# Patient Record
Sex: Female | Born: 1955
Health system: Southern US, Community
[De-identification: ages and names within clinical notes are randomized; demographics above are authoritative.]

## PROBLEM LIST (undated history)

## (undated) DIAGNOSIS — F419 Anxiety disorder, unspecified: Secondary | ICD-10-CM

## (undated) DIAGNOSIS — N200 Calculus of kidney: Secondary | ICD-10-CM

## (undated) DIAGNOSIS — I10 Essential (primary) hypertension: Secondary | ICD-10-CM

## (undated) DIAGNOSIS — M545 Low back pain, unspecified: Secondary | ICD-10-CM

## (undated) DIAGNOSIS — E785 Hyperlipidemia, unspecified: Secondary | ICD-10-CM

## (undated) DIAGNOSIS — J329 Chronic sinusitis, unspecified: Secondary | ICD-10-CM

## (undated) DIAGNOSIS — K589 Irritable bowel syndrome without diarrhea: Secondary | ICD-10-CM

## (undated) DIAGNOSIS — T7840XA Allergy, unspecified, initial encounter: Secondary | ICD-10-CM

## (undated) HISTORY — PX: LASIK: SHX215

## (undated) HISTORY — PX: TUBAL LIGATION: SHX77

## (undated) HISTORY — DX: Essential (primary) hypertension: I10

## (undated) HISTORY — PX: COLONOSCOPY: SHX174

## (undated) HISTORY — DX: Hyperlipidemia, unspecified: E78.5

## (undated) HISTORY — DX: Low back pain: M54.5

## (undated) HISTORY — DX: Irritable bowel syndrome, unspecified: K58.9

## (undated) HISTORY — PX: EYE SURGERY: SHX253

## (undated) HISTORY — DX: Low back pain, unspecified: M54.50

## (undated) HISTORY — DX: Anxiety disorder, unspecified: F41.9

## (undated) HISTORY — PX: BREAST CYST ASPIRATION: SHX578

## (undated) HISTORY — PX: OTHER SURGICAL HISTORY: SHX169

## (undated) HISTORY — PX: ABDOMINAL HYSTERECTOMY: SHX81

## (undated) HISTORY — PX: DILATION AND CURETTAGE OF UTERUS: SHX78

## (undated) HISTORY — DX: Chronic sinusitis, unspecified: J32.9

## (undated) HISTORY — DX: Allergy, unspecified, initial encounter: T78.40XA

---

## 1998-02-18 ENCOUNTER — Ambulatory Visit (HOSPITAL_COMMUNITY): Admission: RE | Admit: 1998-02-18 | Discharge: 1998-02-18 | Payer: Self-pay | Admitting: Obstetrics & Gynecology

## 1998-02-25 ENCOUNTER — Ambulatory Visit (HOSPITAL_COMMUNITY): Admission: RE | Admit: 1998-02-25 | Discharge: 1998-02-25 | Payer: Self-pay | Admitting: Obstetrics & Gynecology

## 1998-09-16 ENCOUNTER — Encounter: Payer: Self-pay | Admitting: Obstetrics & Gynecology

## 1998-09-16 ENCOUNTER — Ambulatory Visit (HOSPITAL_COMMUNITY): Admission: RE | Admit: 1998-09-16 | Discharge: 1998-09-16 | Payer: Self-pay | Admitting: Obstetrics & Gynecology

## 1999-03-11 ENCOUNTER — Encounter: Payer: Self-pay | Admitting: Obstetrics & Gynecology

## 1999-03-11 ENCOUNTER — Ambulatory Visit (HOSPITAL_COMMUNITY): Admission: RE | Admit: 1999-03-11 | Discharge: 1999-03-11 | Payer: Self-pay | Admitting: Obstetrics & Gynecology

## 2000-03-21 ENCOUNTER — Encounter: Payer: Self-pay | Admitting: Obstetrics & Gynecology

## 2000-03-21 ENCOUNTER — Ambulatory Visit (HOSPITAL_COMMUNITY): Admission: RE | Admit: 2000-03-21 | Discharge: 2000-03-21 | Payer: Self-pay | Admitting: Obstetrics & Gynecology

## 2000-05-22 HISTORY — PX: TOTAL VAGINAL HYSTERECTOMY: SHX2548

## 2000-05-22 HISTORY — PX: COLONOSCOPY: SHX174

## 2001-03-28 ENCOUNTER — Encounter: Payer: Self-pay | Admitting: Obstetrics & Gynecology

## 2001-03-28 ENCOUNTER — Ambulatory Visit (HOSPITAL_COMMUNITY): Admission: RE | Admit: 2001-03-28 | Discharge: 2001-03-28 | Payer: Self-pay | Admitting: Obstetrics & Gynecology

## 2001-03-29 ENCOUNTER — Encounter: Admission: RE | Admit: 2001-03-29 | Discharge: 2001-03-29 | Payer: Self-pay | Admitting: Family Medicine

## 2001-03-29 ENCOUNTER — Encounter: Payer: Self-pay | Admitting: Obstetrics & Gynecology

## 2001-06-20 ENCOUNTER — Other Ambulatory Visit: Admission: RE | Admit: 2001-06-20 | Discharge: 2001-06-20 | Payer: Self-pay | Admitting: Obstetrics and Gynecology

## 2001-08-28 ENCOUNTER — Encounter: Admission: RE | Admit: 2001-08-28 | Discharge: 2001-08-28 | Payer: Self-pay | Admitting: Obstetrics & Gynecology

## 2001-08-28 ENCOUNTER — Encounter: Payer: Self-pay | Admitting: Obstetrics & Gynecology

## 2002-04-09 ENCOUNTER — Encounter: Payer: Self-pay | Admitting: Obstetrics & Gynecology

## 2002-04-09 ENCOUNTER — Encounter: Admission: RE | Admit: 2002-04-09 | Discharge: 2002-04-09 | Payer: Self-pay | Admitting: Obstetrics & Gynecology

## 2002-07-08 ENCOUNTER — Other Ambulatory Visit: Admission: RE | Admit: 2002-07-08 | Discharge: 2002-07-08 | Payer: Self-pay | Admitting: Obstetrics & Gynecology

## 2003-04-10 ENCOUNTER — Encounter: Admission: RE | Admit: 2003-04-10 | Discharge: 2003-04-10 | Payer: Self-pay | Admitting: Obstetrics & Gynecology

## 2003-06-10 ENCOUNTER — Ambulatory Visit (HOSPITAL_COMMUNITY): Admission: RE | Admit: 2003-06-10 | Discharge: 2003-06-10 | Payer: Self-pay | Admitting: Obstetrics & Gynecology

## 2003-06-10 ENCOUNTER — Encounter (INDEPENDENT_AMBULATORY_CARE_PROVIDER_SITE_OTHER): Payer: Self-pay | Admitting: Specialist

## 2003-07-23 ENCOUNTER — Other Ambulatory Visit: Admission: RE | Admit: 2003-07-23 | Discharge: 2003-07-23 | Payer: Self-pay | Admitting: Obstetrics & Gynecology

## 2004-06-06 ENCOUNTER — Encounter: Admission: RE | Admit: 2004-06-06 | Discharge: 2004-06-06 | Payer: Self-pay | Admitting: Obstetrics & Gynecology

## 2004-08-22 ENCOUNTER — Other Ambulatory Visit: Admission: RE | Admit: 2004-08-22 | Discharge: 2004-08-22 | Payer: Self-pay | Admitting: Obstetrics & Gynecology

## 2004-08-25 ENCOUNTER — Ambulatory Visit: Payer: Self-pay | Admitting: Internal Medicine

## 2004-08-29 ENCOUNTER — Ambulatory Visit: Payer: Self-pay | Admitting: Internal Medicine

## 2005-03-03 ENCOUNTER — Ambulatory Visit: Payer: Self-pay | Admitting: Internal Medicine

## 2005-03-09 ENCOUNTER — Ambulatory Visit: Payer: Self-pay | Admitting: Internal Medicine

## 2005-06-05 ENCOUNTER — Observation Stay (HOSPITAL_COMMUNITY): Admission: RE | Admit: 2005-06-05 | Discharge: 2005-06-06 | Payer: Self-pay | Admitting: Obstetrics & Gynecology

## 2005-06-05 ENCOUNTER — Encounter (INDEPENDENT_AMBULATORY_CARE_PROVIDER_SITE_OTHER): Payer: Self-pay | Admitting: *Deleted

## 2005-06-19 ENCOUNTER — Encounter: Admission: RE | Admit: 2005-06-19 | Discharge: 2005-06-19 | Payer: Self-pay | Admitting: Obstetrics & Gynecology

## 2005-06-20 ENCOUNTER — Encounter: Admission: RE | Admit: 2005-06-20 | Discharge: 2005-06-20 | Payer: Self-pay | Admitting: Obstetrics & Gynecology

## 2005-09-20 ENCOUNTER — Encounter: Payer: Self-pay | Admitting: Obstetrics & Gynecology

## 2005-10-23 ENCOUNTER — Encounter: Admission: RE | Admit: 2005-10-23 | Discharge: 2005-10-23 | Payer: Self-pay | Admitting: Obstetrics & Gynecology

## 2006-01-24 ENCOUNTER — Encounter: Admission: RE | Admit: 2006-01-24 | Discharge: 2006-01-24 | Payer: Self-pay | Admitting: Obstetrics & Gynecology

## 2006-02-06 ENCOUNTER — Ambulatory Visit: Payer: Self-pay | Admitting: Internal Medicine

## 2006-02-22 ENCOUNTER — Ambulatory Visit: Payer: Self-pay | Admitting: Internal Medicine

## 2006-06-26 ENCOUNTER — Encounter: Admission: RE | Admit: 2006-06-26 | Discharge: 2006-06-26 | Payer: Self-pay | Admitting: Obstetrics & Gynecology

## 2006-06-27 ENCOUNTER — Ambulatory Visit: Payer: Self-pay | Admitting: Internal Medicine

## 2006-06-27 LAB — CONVERTED CEMR LAB
AST: 29 units/L (ref 0–37)
Albumin: 3.7 g/dL (ref 3.5–5.2)
Alkaline Phosphatase: 130 units/L — ABNORMAL HIGH (ref 39–117)
Basophils Absolute: 0 10*3/uL (ref 0.0–0.1)
Bilirubin, Direct: 0.1 mg/dL (ref 0.0–0.3)
Calcium: 9.6 mg/dL (ref 8.4–10.5)
Eosinophils Relative: 1.6 % (ref 0.0–5.0)
HCT: 42.1 % (ref 36.0–46.0)
HDL: 37.8 mg/dL — ABNORMAL LOW (ref >39.0)
Lymphocytes Relative: 55.3 % — ABNORMAL HIGH (ref 12.0–46.0)
MCV: 95.5 fL (ref 78.0–100.0)
Monocytes Absolute: 0.3 10*3/uL (ref 0.2–0.7)
Neutrophils Relative %: 34.2 % — ABNORMAL LOW (ref 43.0–77.0)
Potassium: 4.2 meq/L (ref 3.5–5.1)
TSH: 4.13 microintl units/mL (ref 0.35–5.50)
Total Protein: 6.9 g/dL (ref 6.0–8.3)
WBC: 3.2 10*3/uL — ABNORMAL LOW (ref 4.5–10.5)

## 2006-07-04 ENCOUNTER — Ambulatory Visit: Payer: Self-pay | Admitting: Internal Medicine

## 2006-07-04 LAB — CONVERTED CEMR LAB
Basophils Absolute: 0 10*3/uL (ref 0.0–0.1)
Basophils Relative: 0.2 % (ref 0.0–1.0)
Bilirubin, Direct: 0.2 mg/dL (ref 0.0–0.3)
Eosinophils Absolute: 0 10*3/uL (ref 0.0–0.6)
Monocytes Absolute: 0.4 10*3/uL (ref 0.2–0.7)
Total Bilirubin: 1.2 mg/dL (ref 0.3–1.2)
WBC: 4.8 10*3/uL (ref 4.5–10.5)

## 2006-07-24 ENCOUNTER — Ambulatory Visit: Payer: Self-pay | Admitting: Internal Medicine

## 2006-12-11 ENCOUNTER — Encounter: Admission: RE | Admit: 2006-12-11 | Discharge: 2006-12-11 | Payer: Self-pay | Admitting: Obstetrics & Gynecology

## 2007-01-23 DIAGNOSIS — M545 Low back pain, unspecified: Secondary | ICD-10-CM | POA: Insufficient documentation

## 2007-06-03 ENCOUNTER — Telehealth: Payer: Self-pay | Admitting: Internal Medicine

## 2007-06-04 ENCOUNTER — Ambulatory Visit: Payer: Self-pay | Admitting: Internal Medicine

## 2007-06-04 DIAGNOSIS — E785 Hyperlipidemia, unspecified: Secondary | ICD-10-CM | POA: Insufficient documentation

## 2007-06-04 LAB — CONVERTED CEMR LAB
AST: 16 units/L (ref 0–37)
Albumin: 4.2 g/dL (ref 3.5–5.2)
Alkaline Phosphatase: 62 units/L (ref 39–117)
Bilirubin, Direct: 0.2 mg/dL (ref 0.0–0.3)
HDL: 39.4 mg/dL (ref >39.0)
LDL Cholesterol: 139.4 mg/dL
Total Bilirubin: 1.6 mg/dL — ABNORMAL HIGH (ref 0.3–1.2)
Triglycerides: 89 mg/dL (ref 0–149)
VLDL: 18 mg/dL (ref 0–40)

## 2007-06-13 ENCOUNTER — Ambulatory Visit: Payer: Self-pay | Admitting: Internal Medicine

## 2007-06-13 LAB — CONVERTED CEMR LAB
Cholesterol, target level: 200 mg/dL
HDL goal, serum: 40 mg/dL

## 2007-07-02 ENCOUNTER — Encounter: Admission: RE | Admit: 2007-07-02 | Discharge: 2007-07-02 | Payer: Self-pay | Admitting: Obstetrics & Gynecology

## 2007-09-02 ENCOUNTER — Ambulatory Visit: Payer: Self-pay | Admitting: Internal Medicine

## 2007-09-02 LAB — CONVERTED CEMR LAB
Albumin: 4.2 g/dL (ref 3.5–5.2)
Alkaline Phosphatase: 57 units/L (ref 39–117)
Cholesterol: 197 mg/dL (ref 0–200)

## 2007-09-12 ENCOUNTER — Ambulatory Visit: Payer: Self-pay | Admitting: Internal Medicine

## 2007-09-13 LAB — HM COLONOSCOPY

## 2007-12-16 ENCOUNTER — Ambulatory Visit: Payer: Self-pay | Admitting: Internal Medicine

## 2007-12-16 DIAGNOSIS — M25519 Pain in unspecified shoulder: Secondary | ICD-10-CM | POA: Insufficient documentation

## 2008-02-22 ENCOUNTER — Ambulatory Visit: Payer: Self-pay | Admitting: Internal Medicine

## 2008-03-10 ENCOUNTER — Ambulatory Visit: Payer: Self-pay | Admitting: Internal Medicine

## 2008-03-10 LAB — CONVERTED CEMR LAB
AST: 18 units/L (ref 0–37)
HDL: 42.7 mg/dL (ref >39.0)
LDL Cholesterol: 136 mg/dL — ABNORMAL HIGH (ref 0–99)
Total Bilirubin: 1.3 mg/dL — ABNORMAL HIGH (ref 0.3–1.2)
Total CHOL/HDL Ratio: 4.6
Triglycerides: 91 mg/dL (ref 0–149)
VLDL: 18 mg/dL (ref 0–40)

## 2008-03-18 ENCOUNTER — Ambulatory Visit: Payer: Self-pay | Admitting: Internal Medicine

## 2008-07-09 ENCOUNTER — Encounter: Admission: RE | Admit: 2008-07-09 | Discharge: 2008-07-09 | Payer: Self-pay | Admitting: Obstetrics & Gynecology

## 2008-09-08 ENCOUNTER — Ambulatory Visit: Payer: Self-pay | Admitting: Internal Medicine

## 2008-09-08 LAB — CONVERTED CEMR LAB
Basophils Absolute: 0 10*3/uL (ref 0.0–0.1)
Bilirubin Urine: NEGATIVE
Blood in Urine, dipstick: NEGATIVE
CO2: 31 meq/L (ref 19–32)
CRP, High Sensitivity: <1 (ref 0.00–5.00)
Chloride: 110 meq/L (ref 96–112)
Glucose, Bld: 92 mg/dL (ref 70–99)
HCT: 43.9 % (ref 36.0–46.0)
HDL: 47.5 mg/dL (ref >39.00)
Hemoglobin: 15.3 g/dL — ABNORMAL HIGH (ref 12.0–15.0)
Ketones, urine, test strip: NEGATIVE
Lymphs Abs: 1.6 10*3/uL (ref 0.7–4.0)
MCV: 96 fL (ref 78.0–100.0)
Monocytes Absolute: 0.3 10*3/uL (ref 0.1–1.0)
Neutro Abs: 2.3 10*3/uL (ref 1.4–7.7)
Platelets: 210 10*3/uL (ref 150.0–400.0)
RDW: 11.4 % — ABNORMAL LOW (ref 11.5–14.6)
TSH: 3.28 microintl units/mL (ref 0.35–5.50)
Total Bilirubin: 1.3 mg/dL — ABNORMAL HIGH (ref 0.3–1.2)
Total CHOL/HDL Ratio: 4
Total Protein: 6.7 g/dL (ref 6.0–8.3)
Urobilinogen, UA: 0.2
VLDL: 17.2 mg/dL (ref 0.0–40.0)
WBC Urine, dipstick: NEGATIVE

## 2008-09-16 ENCOUNTER — Ambulatory Visit: Payer: Self-pay | Admitting: Internal Medicine

## 2008-12-19 LAB — CONVERTED CEMR LAB: Pap Smear: NORMAL

## 2009-01-11 ENCOUNTER — Emergency Department (HOSPITAL_COMMUNITY): Admission: EM | Admit: 2009-01-11 | Discharge: 2009-01-11 | Payer: Self-pay | Admitting: Emergency Medicine

## 2009-01-28 ENCOUNTER — Encounter: Payer: Self-pay | Admitting: Internal Medicine

## 2009-06-22 ENCOUNTER — Ambulatory Visit: Payer: Self-pay | Admitting: Internal Medicine

## 2009-06-22 DIAGNOSIS — H3400 Transient retinal artery occlusion, unspecified eye: Secondary | ICD-10-CM | POA: Insufficient documentation

## 2009-06-29 ENCOUNTER — Encounter: Payer: Self-pay | Admitting: Internal Medicine

## 2009-06-30 ENCOUNTER — Encounter: Payer: Self-pay | Admitting: Internal Medicine

## 2009-06-30 ENCOUNTER — Ambulatory Visit: Payer: Self-pay

## 2009-07-20 ENCOUNTER — Encounter: Admission: RE | Admit: 2009-07-20 | Discharge: 2009-07-20 | Payer: Self-pay | Admitting: Obstetrics & Gynecology

## 2009-09-09 ENCOUNTER — Ambulatory Visit: Payer: Self-pay | Admitting: Internal Medicine

## 2009-09-09 LAB — CONVERTED CEMR LAB
AST: 18 units/L (ref 0–37)
Basophils Absolute: 0 10*3/uL (ref 0.0–0.1)
Bilirubin, Direct: 0.2 mg/dL (ref 0.0–0.3)
Calcium: 9.2 mg/dL (ref 8.4–10.5)
Chloride: 107 meq/L (ref 96–112)
Creatinine, Ser: 1 mg/dL (ref 0.4–1.2)
Eosinophils Relative: 0.8 % (ref 0.0–5.0)
GFR calc non Af Amer: 61.49 mL/min (ref >60)
Glucose, Urine, Semiquant: NEGATIVE
Hemoglobin: 15 g/dL (ref 12.0–15.0)
Ketones, urine, test strip: NEGATIVE
LDL Cholesterol: 132 mg/dL — ABNORMAL HIGH (ref 0–99)
Lymphocytes Relative: 40 % (ref 12.0–46.0)
Lymphs Abs: 1.7 10*3/uL (ref 0.7–4.0)
MCV: 95.9 fL (ref 78.0–100.0)
Monocytes Absolute: 0.3 10*3/uL (ref 0.1–1.0)
Neutro Abs: 2.2 10*3/uL (ref 1.4–7.7)
Neutrophils Relative %: 51.2 % (ref 43.0–77.0)
Nitrite: NEGATIVE
Platelets: 226 10*3/uL (ref 150.0–400.0)
RBC: 4.53 M/uL (ref 3.87–5.11)
Specific Gravity, Urine: 1.025
Total Bilirubin: 1.5 mg/dL — ABNORMAL HIGH (ref 0.3–1.2)
Total CHOL/HDL Ratio: 4
Total Protein: 7 g/dL (ref 6.0–8.3)
Triglycerides: 59 mg/dL (ref 0.0–149.0)
WBC: 4.3 10*3/uL — ABNORMAL LOW (ref 4.5–10.5)
pH: 5.5

## 2009-09-24 ENCOUNTER — Ambulatory Visit: Payer: Self-pay | Admitting: Internal Medicine

## 2009-09-27 LAB — HM MAMMOGRAPHY

## 2009-11-19 ENCOUNTER — Ambulatory Visit: Payer: Self-pay | Admitting: Internal Medicine

## 2009-11-19 DIAGNOSIS — R030 Elevated blood-pressure reading, without diagnosis of hypertension: Secondary | ICD-10-CM | POA: Insufficient documentation

## 2010-05-22 HISTORY — PX: COLONOSCOPY: SHX174

## 2010-06-11 ENCOUNTER — Encounter: Payer: Self-pay | Admitting: Obstetrics & Gynecology

## 2010-06-19 LAB — CONVERTED CEMR LAB: Pap Smear: NORMAL

## 2010-06-23 NOTE — Miscellaneous (Signed)
Summary: Orders Update  Clinical Lists Changes  Orders: Added new Test order of Carotid Duplex (Carotid Duplex) - Signed 

## 2010-06-23 NOTE — Assessment & Plan Note (Signed)
Summary: CPX/NO PAP/NJR   Vital Signs:  Patient profile:   55 year old female Height:      64 inches Weight:      125 pounds BMI:     21.53 Temp:     98.6 degrees F oral Pulse rate:   72 / minute Resp:     14 per minute BP sitting:   120 / 70  (left arm)  Vitals Entered By: Willy Eddy, LPN (Sep 25, 2438 3:15 PM) CC: cpx, Lipid Management   CC:  cpx and Lipid Management.  History of Present Illness: The pt was asked about all immunizations, health maint. services that are appropriate to their age and was given guidance on diet exercize  and weight management   Lipid Management History:      Negative NCEP/ATP III risk factors include female age less than 80 years old, no history of early menopause without estrogen hormone replacement, no family history for ischemic heart disease, non-tobacco-user status, non-hypertensive, no ASHD (atherosclerotic heart disease), no prior stroke/TIA, no peripheral vascular disease, and no history of aortic aneurysm.     Preventive Screening-Counseling & Management  Alcohol-Tobacco     Smoking Status: never  Problems Prior to Update: 1)  Amaurosis Fugax  (ICD-362.34) 2)  Physical Examination  (ICD-V70.0) 3)  Shoulder Pain, Left  (ICD-719.41) 4)  Hyperlipidemia  (ICD-272.4) 5)  Low Back Pain  (ICD-724.2)  Current Problems (verified): 1)  Amaurosis Fugax  (ICD-362.34) 2)  Physical Examination  (ICD-V70.0) 3)  Shoulder Pain, Left  (ICD-719.41) 4)  Hyperlipidemia  (ICD-272.4) 5)  Low Back Pain  (ICD-724.2)  Medications Prior to Update: 1)  Niacin 500 Mg  Tbcr (Niacin) .... Once Daily 2)  Estrogen Patch .05 .... Change 2 Times A Week 3)  Fish Oil Concentrate 1000 Mg  Caps (Omega-3 Fatty Acids) .... Two By Mouth Bid 4)  Calcium 600 1500 Mg Tabs (Calcium Carbonate) .... 2 Once Daily 5)  Vitamin D 1000 Unit Caps (Cholecalciferol) .Marland Kitchen.. 1 Once Daily  Current Medications (verified): 1)  Niacin 500 Mg  Tbcr (Niacin) .... Once Daily 2)   Estrogen Patch .05 .... Change 2 Times A Week 3)  Fish Oil Concentrate 1000 Mg  Caps (Omega-3 Fatty Acids) .... Two By Mouth Bid 4)  Calcium 600 1500 Mg Tabs (Calcium Carbonate) .... 2 Once Daily 5)  Vitamin D 1000 Unit Caps (Cholecalciferol) .Marland Kitchen.. 1 Once Daily  Allergies (verified): No Known Drug Allergies  Past History:  Family History: Last updated: 01/23/2007 Family History Other cancer Family History Hypertension Family History Ovarian cancer Family History of Stroke M 1st degree relative <50  Social History: Last updated: 01/23/2007 Married Never Smoked Alcohol use-yes Regular exercise-yes  Risk Factors: Exercise: yes (01/23/2007)  Risk Factors: Smoking Status: never (09/24/2009)  Past medical, surgical, family and social histories (including risk factors) reviewed, and no changes noted (except as noted below).  Past Medical History: Reviewed history from 01/23/2007 and no changes required. IBS Low back pain Sinusitis  Past Surgical History: Reviewed history from 09/16/2008 and no changes required. TAH '07 Colon '02 Tubal ligation eye surgery for vison  Family History: Reviewed history from 01/23/2007 and no changes required. Family History Other cancer Family History Hypertension Family History Ovarian cancer Family History of Stroke M 1st degree relative <50  Social History: Reviewed history from 01/23/2007 and no changes required. Married Never Smoked Alcohol use-yes Regular exercise-yes  Review of Systems  The patient denies anorexia, fever, weight loss, weight gain,  vision loss, decreased hearing, hoarseness, chest pain, syncope, dyspnea on exertion, peripheral edema, prolonged cough, headaches, hemoptysis, abdominal pain, melena, hematochezia, severe indigestion/heartburn, hematuria, incontinence, genital sores, muscle weakness, suspicious skin lesions, transient blindness, difficulty walking, depression, unusual weight change, abnormal  bleeding, enlarged lymph nodes, angioedema, breast masses, and testicular masses.    Physical Exam  General:  Well-developed,well-nourished,in no acute distress; alert,appropriate and cooperative throughout examination Head:  Normocephalic and atraumatic without obvious abnormalities. No apparent alopecia or balding. Eyes:  No corneal or conjunctival inflammation noted. EOMI. Perrla. Funduscopic exam benign, without hemorrhages, exudates or papilledema. Vision grossly normal. Ears:  R ear normal and L ear normal.   Nose:  no external deformity and no nasal discharge.   Mouth:  Oral mucosa and oropharynx without lesions or exudates.  Teeth in good repair. Neck:  No deformities, masses, or tenderness noted. Lungs:  Normal respiratory effort, chest expands symmetrically. Lungs are clear to auscultation, no crackles or wheezes. Heart:  Normal rate and regular rhythm. S1 and S2 normal without gallop, murmur, click, rub or other extra sounds. Abdomen:  Bowel sounds positive,abdomen soft and non-tender without masses, organomegaly or hernias noted. Rectal:  No external abnormalities noted. Normal sphincter tone. No rectal masses or tenderness.   Impression & Recommendations:  Problem # 1:  PHYSICAL EXAMINATION (ICD-V70.0)  Mammogram: normal (07/19/2009) Pap smear: normal (12/19/2008) Colonoscopy: normal (09/13/2007) Td Booster: Historical (05/22/2006)   Flu Vax: fluvirin (01/27/2009)   Pneumovax: Historical (03/09/2005) Chol: 195 (09/09/2009)   HDL: 51.30 (09/09/2009)   LDL: 132 (09/09/2009)   TG: 59.0 (09/09/2009) TSH: 3.88 (09/09/2009)   Next mammogram due:: 07/2010 (09/24/2009) Next Colonoscopy due:: 09/2012 (09/16/2008)  Discussed using sunscreen, use of alcohol, drug use, self breast exam, routine dental care, routine eye care, schedule for GYN exam, routine physical exam, seat belts, multiple vitamins, osteoporosis prevention, adequate calcium intake in diet, recommendations for  immunizations, mammograms and Pap smears.  Discussed exercise and checking cholesterol.  Discussed gun safety, safe sex, and contraception.  Complete Medication List: 1)  Niacin 500 Mg Tbcr (Niacin) .... Once daily 2)  Estrogen Patch .05  .... Change 2 times a week 3)  Fish Oil Concentrate 1000 Mg Caps (Omega-3 fatty acids) .... Two by mouth bid 4)  Calcium 600 1500 Mg Tabs (Calcium carbonate) .... 2 once daily 5)  Vitamin D 1000 Unit Caps (Cholecalciferol) .Marland Kitchen.. 1 once daily  Lipid Assessment/Plan:      Based on NCEP/ATP III, the patient's risk factor category is "0-1 risk factors".  The patient's lipid goals are as follows: Total cholesterol goal is 200; LDL cholesterol goal is 160; HDL cholesterol goal is 40; Triglyceride goal is 150.  Her LDL cholesterol goal has not been met.  Secondary causes for hyperlipidemia have been ruled out.  She has been counseled on adjunctive measures for lowering her cholesterol and has been provided with dietary instructions.    Patient Instructions: 1)  obtain blood pressure cuff  measure 4-5 times a week 2)  limit salt 3)  work of stress 4)  Please schedule a follow-up appointment in 2 months.   Preventive Care Screening  Mammogram:    Date:  07/19/2009    Next Due:  07/2010    Results:  normal   Pap Smear:    Date:  12/19/2008    Next Due:  12/2009    Results:  normal

## 2010-06-23 NOTE — Assessment & Plan Note (Signed)
Summary: CONSULT RE: EYE SPASMS/FLASHING OF LIGHT/CJR   Vital Signs:  Patient profile:   55 year old female Height:      64 inches Weight:      126 pounds BMI:     21.71 Temp:     98.2 degrees F oral Pulse rate:   72 / minute Resp:     14 per minute BP sitting:   140 / 80  (left arm)  Vitals Entered By: Willy Eddy, LPN (June 22, 2009 11:27 AM) CC: had 1 episode of eye flashing yesterday with slight nausea- to opthamolgist and every thing was ok- was told to go to pcp check for any vascular problems   CC:  had 1 episode of eye flashing yesterday with slight nausea- to opthamolgist and every thing was ok- was told to go to pcp check for any vascular problems.  History of Present Illness: The pt had a 10 min episode of flashes of light in the left greater that the right with some blurring of vision No head aches. Was moderately dizzy nad nauseated for about a minute Went to retinal specialist and there was not any intraocular pathology detected. Mild allergies with PND   Preventive Screening-Counseling & Management  Alcohol-Tobacco     Smoking Status: never  Problems Prior to Update: 1)  Physical Examination  (ICD-V70.0) 2)  Shoulder Pain, Left  (ICD-719.41) 3)  Hyperlipidemia  (ICD-272.4) 4)  Low Back Pain  (ICD-724.2)  Current Problems (verified): 1)  Physical Examination  (ICD-V70.0) 2)  Shoulder Pain, Left  (ICD-719.41) 3)  Hyperlipidemia  (ICD-272.4) 4)  Low Back Pain  (ICD-724.2)  Medications Prior to Update: 1)  Niacin 500 Mg  Tbcr (Niacin) .... Once Daily 2)  Estrogen Patch .05 .... Change 2 Times A Week 3)  Fish Oil Concentrate 1000 Mg  Caps (Omega-3 Fatty Acids) .... Two By Mouth Bid 4)  Calcium 600 1500 Mg Tabs (Calcium Carbonate) .... 2 Once Daily 5)  Vitamin D 1000 Unit Caps (Cholecalciferol) .Marland Kitchen.. 1 Once Daily  Current Medications (verified): 1)  Niacin 500 Mg  Tbcr (Niacin) .... Once Daily 2)  Estrogen Patch .05 .... Change 2 Times A Week 3)   Fish Oil Concentrate 1000 Mg  Caps (Omega-3 Fatty Acids) .... Two By Mouth Bid 4)  Calcium 600 1500 Mg Tabs (Calcium Carbonate) .... 2 Once Daily 5)  Vitamin D 1000 Unit Caps (Cholecalciferol) .Marland Kitchen.. 1 Once Daily  Allergies (verified): No Known Drug Allergies  Past History:  Family History: Last updated: 01/23/2007 Family History Other cancer Family History Hypertension Family History Ovarian cancer Family History of Stroke M 1st degree relative <50  Social History: Last updated: 01/23/2007 Married Never Smoked Alcohol use-yes Regular exercise-yes  Risk Factors: Exercise: yes (01/23/2007)  Risk Factors: Smoking Status: never (06/22/2009)  Past medical, surgical, family and social histories (including risk factors) reviewed, and no changes noted (except as noted below).  Past Medical History: Reviewed history from 01/23/2007 and no changes required. IBS Low back pain Sinusitis  Past Surgical History: Reviewed history from 09/16/2008 and no changes required. TAH '07 Colon '02 Tubal ligation eye surgery for vison  Family History: Reviewed history from 01/23/2007 and no changes required. Family History Other cancer Family History Hypertension Family History Ovarian cancer Family History of Stroke M 1st degree relative <50  Social History: Reviewed history from 01/23/2007 and no changes required. Married Never Smoked Alcohol use-yes Regular exercise-yes  Review of Systems  The patient denies anorexia, fever, weight  loss, weight gain, vision loss, decreased hearing, hoarseness, chest pain, syncope, dyspnea on exertion, peripheral edema, prolonged cough, headaches, hemoptysis, abdominal pain, melena, hematochezia, severe indigestion/heartburn, hematuria, incontinence, genital sores, muscle weakness, suspicious skin lesions, transient blindness, difficulty walking, depression, unusual weight change, abnormal bleeding, enlarged lymph nodes, angioedema, and breast  masses.    Physical Exam  General:  Well-developed,well-nourished,in no acute distress; alert,appropriate and cooperative throughout examination Head:  Normocephalic and atraumatic without obvious abnormalities. No apparent alopecia or balding. Ears:  R ear normal and L ear normal.   Neck:  No deformities, masses, or tenderness noted. Lungs:  Normal respiratory effort, chest expands symmetrically. Lungs are clear to auscultation, no crackles or wheezes. Heart:  Normal rate and regular rhythm. S1 and S2 normal without gallop, murmur, click, rub or other extra sounds. Pulses:  R and L carotid,radial,femoral,dorsalis pedis and posterior tibial pulses are full and equal bilaterally   Impression & Recommendations:  Problem # 1:  AMAUROSIS FUGAX (ICD-362.34) differential include ocular migraine single episode of flashing vision, eye exam by retinal specialist was norma; family hx of migraine with no personal  hx  Orders: Doppler Referral (Doppler)  Problem # 2:  HYPERLIPIDEMIA (ICD-272.4)  Her updated medication list for this problem includes:    Niacin 500 Mg Tbcr (Niacin) ..... Once daily  Orders: Doppler Referral (Doppler)  Labs Reviewed: SGOT: 17 (09/08/2008)   SGPT: 22 (09/08/2008)  Lipid Goals: Chol Goal: 200 (06/13/2007)   HDL Goal: 40 (06/13/2007)   LDL Goal: 160 (06/13/2007)   TG Goal: 150 (06/13/2007)  Prior 10 Yr Risk Heart Disease: 8 % (06/13/2007)   HDL:47.50 (09/08/2008), 42.7 (03/10/2008)  LDL:133 (09/08/2008), 136 (03/10/2008)  Chol:198 (09/08/2008), 197 (03/10/2008)  Trig:86.0 (09/08/2008), 91 (03/10/2008)  Complete Medication List: 1)  Niacin 500 Mg Tbcr (Niacin) .... Once daily 2)  Estrogen Patch .05  .... Change 2 times a week 3)  Fish Oil Concentrate 1000 Mg Caps (Omega-3 fatty acids) .... Two by mouth bid 4)  Calcium 600 1500 Mg Tabs (Calcium carbonate) .... 2 once daily 5)  Vitamin D 1000 Unit Caps (Cholecalciferol) .Marland Kitchen.. 1 once daily  Patient  Instructions: 1)  Please schedule a follow-up appointment as needed.

## 2010-06-23 NOTE — Miscellaneous (Signed)
Summary: Health Care Power of [facility] Power of Attorney   Imported By: Maryln Gottron 10/04/2009 13:17:48  _____________________________________________________________________  External Attachment:    Type:   Image     Comment:   External Document

## 2010-06-23 NOTE — Assessment & Plan Note (Signed)
Summary: 2 month rov/njr   Vital Signs:  Patient profile:   55 year old female Height:      64 inches Weight:      126 pounds BMI:     21.71 Temp:     98.2 degrees F oral Pulse rate:   72 / minute Resp:     14 per minute BP sitting:   130 / 78  (left arm)  Vitals Entered By: Willy Eddy, LPN (November 19, 1476 10:09 AM) CC: bp check, Lipid Management Is Patient Diabetic? No   CC:  bp check and Lipid Management.  History of Present Illness: there was some diferential in the bloood pressure at home and the office reading  whoich suggested white coat unstressed has been dealing with the issue of divorce    Lipid Management History:      Negative NCEP/ATP III risk factors include female age less than 42 years old, no history of early menopause without estrogen hormone replacement, no family history for ischemic heart disease, non-tobacco-user status, non-hypertensive, no ASHD (atherosclerotic heart disease), no prior stroke/TIA, no peripheral vascular disease, and no history of aortic aneurysm.     Preventive Screening-Counseling & Management  Alcohol-Tobacco     Smoking Status: never  Problems Prior to Update: 1)  Amaurosis Fugax  (ICD-362.34) 2)  Physical Examination  (ICD-V70.0) 3)  Shoulder Pain, Left  (ICD-719.41) 4)  Hyperlipidemia  (ICD-272.4) 5)  Low Back Pain  (ICD-724.2)  Current Problems (verified): 1)  Amaurosis Fugax  (ICD-362.34) 2)  Physical Examination  (ICD-V70.0) 3)  Shoulder Pain, Left  (ICD-719.41) 4)  Hyperlipidemia  (ICD-272.4) 5)  Low Back Pain  (ICD-724.2)  Medications Prior to Update: 1)  Niacin 500 Mg  Tbcr (Niacin) .... Once Daily 2)  Estrogen Patch .05 .... Change 2 Times A Week 3)  Fish Oil Concentrate 1000 Mg  Caps (Omega-3 Fatty Acids) .... Two By Mouth Bid 4)  Calcium 600 1500 Mg Tabs (Calcium Carbonate) .... 2 Once Daily 5)  Vitamin D 1000 Unit Caps (Cholecalciferol) .Marland Kitchen.. 1 Once Daily  Current Medications (verified): 1)  Niacin  500 Mg  Tbcr (Niacin) .... Once Daily 2)  Estrogen Patch .05 .... Change 2 Times A Week 3)  Fish Oil Concentrate 1000 Mg  Caps (Omega-3 Fatty Acids) .... Two By Mouth Bid 4)  Calcium 600 1500 Mg Tabs (Calcium Carbonate) .... 2 Once Daily 5)  Vitamin D 1000 Unit Caps (Cholecalciferol) .Marland Kitchen.. 1 Once Daily  Allergies (verified): No Known Drug Allergies  Past History:  Family History: Last updated: 01/23/2007 Family History Other cancer Family History Hypertension Family History Ovarian cancer Family History of Stroke M 1st degree relative <50  Social History: Last updated: 01/23/2007 Married Never Smoked Alcohol use-yes Regular exercise-yes  Risk Factors: Exercise: yes (01/23/2007)  Risk Factors: Smoking Status: never (11/19/2009)  Past medical, surgical, family and social histories (including risk factors) reviewed, and no changes noted (except as noted below).  Past Medical History: Reviewed history from 01/23/2007 and no changes required. IBS Low back pain Sinusitis  Past Surgical History: Reviewed history from 09/16/2008 and no changes required. TAH '07 Colon '02 Tubal ligation eye surgery for vison  Family History: Reviewed history from 01/23/2007 and no changes required. Family History Other cancer Family History Hypertension Family History Ovarian cancer Family History of Stroke M 1st degree relative <50  Social History: Reviewed history from 01/23/2007 and no changes required. Married Never Smoked Alcohol use-yes Regular exercise-yes  Review of Systems  The patient denies anorexia, fever, weight loss, weight gain, vision loss, decreased hearing, hoarseness, chest pain, syncope, dyspnea on exertion, peripheral edema, prolonged cough, headaches, hemoptysis, abdominal pain, melena, hematochezia, severe indigestion/heartburn, hematuria, incontinence, genital sores, muscle weakness, suspicious skin lesions, transient blindness, difficulty walking,  depression, unusual weight change, abnormal bleeding, enlarged lymph nodes, angioedema, and breast masses.    Physical Exam  General:  Well-developed,well-nourished,in no acute distress; alert,appropriate and cooperative throughout examination Head:  Normocephalic and atraumatic without obvious abnormalities. No apparent alopecia or balding. Eyes:  No corneal or conjunctival inflammation noted. EOMI. Perrla. Funduscopic exam benign, without hemorrhages, exudates or papilledema. Vision grossly normal. Ears:  R ear normal and L ear normal.   Nose:  no external deformity and no nasal discharge.   Mouth:  Oral mucosa and oropharynx without lesions or exudates.  Teeth in good repair. Neck:  No deformities, masses, or tenderness noted. Lungs:  Normal respiratory effort, chest expands symmetrically. Lungs are clear to auscultation, no crackles or wheezes. Heart:  Normal rate and regular rhythm. S1 and S2 normal without gallop, murmur, click, rub or other extra sounds.   Impression & Recommendations:  Problem # 1:  HYPERLIPIDEMIA (ICD-272.4)  Her updated medication list for this problem includes:    Niacin 500 Mg Tbcr (Niacin) ..... Once daily  Labs Reviewed: SGOT: 18 (09/09/2009)   SGPT: 18 (09/09/2009)  Lipid Goals: Chol Goal: 200 (06/13/2007)   HDL Goal: 40 (06/13/2007)   LDL Goal: 160 (06/13/2007)   TG Goal: 150 (06/13/2007)  Prior 10 Yr Risk Heart Disease: 6 % (09/24/2009)   HDL:51.30 (09/09/2009), 47.50 (09/08/2008)  LDL:132 (09/09/2009), 133 (09/08/2008)  Chol:195 (09/09/2009), 198 (09/08/2008)  Trig:59.0 (09/09/2009), 86.0 (09/08/2008)  Problem # 2:  ELEVATED BLOOD PRESSURE WITHOUT DIAGNOSIS OF HYPERTENSION (ICD-796.2)  BP today: 130/78 Prior BP: 120/70 (09/24/2009)  Prior 10 Yr Risk Heart Disease: 6 % (09/24/2009)  Labs Reviewed: Creat: 1.0 (09/09/2009) Chol: 195 (09/09/2009)   HDL: 51.30 (09/09/2009)   LDL: 132 (09/09/2009)   TG: 59.0 (09/09/2009)  Instructed in low sodium  diet (DASH Handout) and behavior modification.    Problem # 3:  LOW BACK PAIN (ICD-724.2)  Complete Medication List: 1)  Niacin 500 Mg Tbcr (Niacin) .... Once daily 2)  Estrogen Patch .05  .... Change 2 times a week 3)  Fish Oil Concentrate 1000 Mg Caps (Omega-3 fatty acids) .... Two by mouth bid 4)  Calcium 600 1500 Mg Tabs (Calcium carbonate) .... 2 once daily 5)  Vitamin D 1000 Unit Caps (Cholecalciferol) .Marland Kitchen.. 1 once daily  Lipid Assessment/Plan:      Based on NCEP/ATP III, the patient's risk factor category is "0-1 risk factors".  The patient's lipid goals are as follows: Total cholesterol goal is 200; LDL cholesterol goal is 160; HDL cholesterol goal is 40; Triglyceride goal is 150.  Her LDL cholesterol goal has not been met.  Secondary causes for hyperlipidemia have been ruled out.  She has been counseled on adjunctive measures for lowering her cholesterol and has been provided with dietary instructions.    Patient Instructions: 1)  march 08/2009  cpx  Appended Document: Orders Update    Clinical Lists Changes  Observations: Added new observation of FLU VAX: Historical (01/20/2010 15:36)       Immunization History:  Influenza Immunization History:    Influenza:  historical (01/20/2010)

## 2010-06-23 NOTE — Miscellaneous (Signed)
Summary: Living Will  Living Will   Imported By: Maryln Gottron 10/04/2009 13:15:45  _____________________________________________________________________  External Attachment:    Type:   Image     Comment:   External Document

## 2010-06-27 ENCOUNTER — Other Ambulatory Visit: Payer: Self-pay | Admitting: Obstetrics & Gynecology

## 2010-06-27 DIAGNOSIS — Z1231 Encounter for screening mammogram for malignant neoplasm of breast: Secondary | ICD-10-CM

## 2010-07-26 ENCOUNTER — Ambulatory Visit: Payer: Self-pay

## 2010-08-04 ENCOUNTER — Ambulatory Visit
Admission: RE | Admit: 2010-08-04 | Discharge: 2010-08-04 | Disposition: A | Discharge status: Home / Self Care | Payer: BC Managed Care – PPO | Source: Ambulatory Visit | Attending: Obstetrics & Gynecology | Admitting: Obstetrics & Gynecology

## 2010-08-04 DIAGNOSIS — Z1231 Encounter for screening mammogram for malignant neoplasm of breast: Secondary | ICD-10-CM

## 2010-09-19 ENCOUNTER — Other Ambulatory Visit (INDEPENDENT_AMBULATORY_CARE_PROVIDER_SITE_OTHER): Payer: BC Managed Care – PPO | Admitting: Internal Medicine

## 2010-09-19 DIAGNOSIS — Z0000 Encounter for general adult medical examination without abnormal findings: Secondary | ICD-10-CM

## 2010-09-19 LAB — CBC WITH DIFFERENTIAL/PLATELET
Basophils Relative: 0.6 % (ref 0.0–3.0)
Eosinophils Relative: 1.3 % (ref 0.0–5.0)
HCT: 42 % (ref 36.0–46.0)
Hemoglobin: 14.5 g/dL (ref 12.0–15.0)
Lymphs Abs: 2.2 10*3/uL (ref 0.7–4.0)
Monocytes Relative: 8.2 % (ref 3.0–12.0)
Neutro Abs: 1.7 10*3/uL (ref 1.4–7.7)
WBC: 4.3 10*3/uL — ABNORMAL LOW (ref 4.5–10.5)

## 2010-09-19 LAB — LIPID PANEL
Cholesterol: 191 mg/dL (ref 0–200)
LDL Cholesterol: 130 mg/dL — ABNORMAL HIGH (ref 0–99)

## 2010-09-19 LAB — BASIC METABOLIC PANEL
Chloride: 105 mEq/L (ref 96–112)
GFR: 56.65 mL/min — ABNORMAL LOW (ref >60.00)
Potassium: 5.2 mEq/L — ABNORMAL HIGH (ref 3.5–5.1)
Sodium: 140 mEq/L (ref 135–145)

## 2010-09-19 LAB — HEPATIC FUNCTION PANEL
ALT: 22 U/L (ref 0–35)
AST: 18 U/L (ref 0–37)
Albumin: 3.9 g/dL (ref 3.5–5.2)
Total Protein: 6.4 g/dL (ref 6.0–8.3)

## 2010-09-19 LAB — TSH: TSH: 4.1 u[IU]/mL (ref 0.35–5.50)

## 2010-09-19 LAB — POCT URINALYSIS DIPSTICK
Glucose, UA: NEGATIVE
Spec Grav, UA: 1.03
Urobilinogen, UA: 0.2

## 2010-09-20 ENCOUNTER — Encounter: Payer: Self-pay | Admitting: Internal Medicine

## 2010-09-26 ENCOUNTER — Ambulatory Visit (INDEPENDENT_AMBULATORY_CARE_PROVIDER_SITE_OTHER): Payer: BC Managed Care – PPO | Admitting: Internal Medicine

## 2010-09-26 ENCOUNTER — Encounter: Payer: Self-pay | Admitting: Internal Medicine

## 2010-09-26 DIAGNOSIS — E876 Hypokalemia: Secondary | ICD-10-CM

## 2010-09-26 DIAGNOSIS — E785 Hyperlipidemia, unspecified: Secondary | ICD-10-CM

## 2010-09-26 MED ORDER — AMBULATORY NON FORMULARY MEDICATION: Status: DC

## 2010-09-26 NOTE — Progress Notes (Signed)
  Subjective:    Patient ID: Karen Salinas, female    DOB: 22-Mar-1956, 55 y.o.   MRN: [id number]  HPI    Review of Systems  Constitutional: Negative for activity change, appetite change and fatigue.  HENT: Negative for ear pain, congestion, neck pain, postnasal drip and sinus pressure.   Eyes: Negative for redness and visual disturbance.  Respiratory: Negative for cough, shortness of breath and wheezing.   Gastrointestinal: Negative for abdominal pain and abdominal distention.  Genitourinary: Negative for dysuria, frequency and menstrual problem.  Musculoskeletal: Negative for myalgias, joint swelling and arthralgias.  Skin: Negative for rash and wound.  Neurological: Negative for dizziness, weakness and headaches.  Hematological: Negative for adenopathy. Does not bruise/bleed easily.  Psychiatric/Behavioral: Negative for sleep disturbance and decreased concentration.       Past Medical History  Diagnosis Date  . IBS (irritable bowel syndrome)   . Low back pain   . Sinusitis    Past Surgical History  Procedure Date  . Abdominal hysterectomy   . Colonoscopy 2002  . Tubal ligation   . Eye surgery for vision     reports that she has never smoked. She does not have any smokeless tobacco history on file. She reports that she drinks alcohol. She reports that she does not use illicit drugs. family history includes Hypertension in her father and mother and Stroke in her brother. No Known Allergies  Objective:   Physical Exam  Constitutional: She is oriented to person, place, and time. She appears well-developed and well-nourished. No distress.  HENT:  Head: Normocephalic and atraumatic.  Right Ear: External ear normal.  Left Ear: External ear normal.  Nose: Nose normal.  Mouth/Throat: Oropharynx is clear and moist.  Eyes: Conjunctivae and EOM are normal. Pupils are equal, round, and reactive to light.  Neck: Normal range of motion. Neck supple. No JVD present. No tracheal  deviation present. No thyromegaly present.  Cardiovascular: Normal rate, regular rhythm, normal heart sounds and intact distal pulses.   No murmur heard. Pulmonary/Chest: Effort normal and breath sounds normal. She has no wheezes. She exhibits no tenderness.  Abdominal: Soft. Bowel sounds are normal.  Musculoskeletal: Normal range of motion. She exhibits no edema and no tenderness.  Lymphadenopathy:    She has no cervical adenopathy.  Neurological: She is alert and oriented to person, place, and time. She has normal reflexes. No cranial nerve deficit.  Skin: Skin is warm and dry. She is not diaphoretic.  Psychiatric: She has a normal mood and affect. Her behavior is normal.          Assessment & Plan:   This is a routine physical examination for this healthy  Female. Reviewed all health maintenance protocols including mammography colonoscopy bone density and reviewed appropriate screening labs. Her immunization history was reviewed as well as her current medications and allergies refills of her chronic medications were given and the plan for yearly health maintenance was discussed all orders and referrals were made as appropriate.

## 2010-10-07 NOTE — Discharge Summary (Signed)
Karen Salinas, SCHWOERER                 ACCOUNT NO.:  [id number]   MEDICAL RECORD NO.:  [id number]          PATIENT TYPE:  OBV   LOCATION:  9320                          FACILITY:  [facility]   PHYSICIAN:  Freddy Finner, M.D.   DATE OF BIRTH:  04-Aug-1955   DATE OF ADMISSION:  06/05/2005  DATE OF DISCHARGE:  06/06/2005                                 DISCHARGE SUMMARY   DISCHARGE DIAGNOSES:  1.  Uterine leiomyomata.  2.  Persistent right ovarian cyst, resolved by time of surgery.  3.  Elevated CA125 with family history of ovarian cancer.  4.  Menorrhagia.   OPERATIVE PROCEDURE:  Laparoscopically-assisted vaginal hysterectomy,  bilateral salpingo-oophorectomy.   INTRAOPERATIVE AND POSTOPERATIVE COMPLICATIONS:  None.   DISPOSITION:  The patient was in satisfactory, improved condition on the  morning after surgery.  She was ambulating without difficulty, having  adequate bowel and bladder function.  She was tolerating a regular diet.  She was discharged home with Vivelle 0.15 mg for hormone replacement  therapy.  She was given Percocet 5/325 mg to be taken as needed for  postoperative pain with or in addition to ibuprofen 600 mg every six hours.  She is to follow up in the office in approximately two weeks.  She is to  call for fever or for heavy vaginal bleeding.  She is to take stool  softeners as needed.   The details of the present illness, past history, family history, review of  systems and physical exam are recorded in the admission note.  Essentially,  the postoperative diagnoses are the significant things in her history and  the physical findings on admission most remarkable for uterine fibroids and  for an ovarian cyst that persisted for at least three months' period of  time.   Laboratory data during this admission includes a normal urinalysis on  admission, a normal prothrombin time and PTT.  Hemoglobin of 15.1.  Postoperative hemoglobin was 13.5.  Histology on surgically removed  tissue  is pending at this time, although the pathologist did evaluate the tissue  immediately after surgery and found benign-appearing fibroids.   HOSPITAL COURSE:  The patient was admitted on the morning of surgery.  She  was given IV antibiotic and was placed in antiembolic compression hose and  taken to the operating room, where the above-described procedure was  accomplished without difficulty.  Blood loss was minimal.  On the morning  after surgery she was in good condition.  She was noted to be afebrile, her vital signs were stable, she was  ambulating without difficulty, having adequate bowel and bladder function,  and her condition was considered to be good.  She was discharged home with  disposition as noted above.      Freddy Finner, M.D.  Electronically Signed     WRN/MEDQ  D:  06/06/2005  T:  06/06/2005  Job:  [id number]

## 2010-10-07 NOTE — Op Note (Signed)
NAME:  Karen Salinas, Karen Salinas                           ACCOUNT NO.:  [id number]   MEDICAL RECORD NO.:  [id number]                   PATIENT TYPE:  AMB   LOCATION:  SDC                                  FACILITY:  [facility]   PHYSICIAN:  Freddy Finner, M.D.                DATE OF BIRTH:  1956-03-11   DATE OF PROCEDURE:  06/10/2003  DATE OF DISCHARGE:                                 OPERATIVE REPORT   PREOPERATIVE DIAGNOSIS:  Menorrhagia, midcycle bleeding, endometrial polyps,  uterine fibroids.   POSTOPERATIVE DIAGNOSIS:  Menorrhagia, midcycle bleeding, endometrial  polyps, uterine fibroids.   PROCEDURE:  Hysteroscopy, D&C, and resection of endometrial polyps.   SURGEON:  Freddy Finner, M.D.   ANESTHESIA:  Managed intravenous sedation and paracervical block.   ESTIMATED BLOOD LOSS:  10 mL.   SORBITOL DEFICIT:  50 mL.   COMPLICATIONS:  None.   INDICATIONS FOR PROCEDURE:  The patient is a 55 year old who has had  evaluation in the office for abnormal bleeding which showed at least three  polyps on sonohysterogram.  She also has an intramural leiomyomata, but none  in the cavity.  She was admitted today for hysteroscopy and D&C.  Operative  findings recorded in still photographs.   DESCRIPTION OF PROCEDURE:  She was admitted on the morning of surgery,  brought to the operating room, and placed under adequate intravenous  sedation, and placed in the dorsal lithotomy position using the Alan  stirrups system.  Betadine prep was carried out in the usual fashion.  Sterile drapes were applied.  Bivalve speculum was introduced.  Paracervical  block was placed using a total of 10 mL of 1% Xylocaine injected from 4 and  8 o'clock in the vaginal fornices.  The cervix was grasped on the anterior  lip with a single tooth tenaculum.  The uterus sounded to 10 cm.  The cervix  was progressively dilated to 23 with Pratts.  The [facility] 12.5 degree  hysteroscope was used with 3% Sorbitol as the distending  medium.  Inspection  revealed at least four polyps, maybe five.  Photographs were made of at  least some of these.  Gentle thorough curettage was carried out on three  different occasions with exploration with polyp forceps and hysteroscopic  confirmation of resection of all polyps.  The procedure at this point was  terminated.  All instruments were removed.  The patient was awakened and  taken to the recovery room in good condition.  She will be given routine  outpatient surgical instructions for follow up in the office in  approximately one week.                                               Freddy Finner, M.D.  WRN/MEDQ  D:  06/10/2003  T:  06/10/2003  Job:  [id number]

## 2010-10-07 NOTE — H&P (Signed)
Karen Salinas, Karen Salinas                 ACCOUNT NO.:  [id number]   MEDICAL RECORD NO.:  [id number]          PATIENT TYPE:  AMB   LOCATION:  SDC                           [facility]ACILITY:  [facility]   PHYSICIAN:  Freddy Finner, M.D.   DATE OF BIRTH:  May 17, 1956   DATE OF ADMISSION:  06/05/2005  DATE OF DISCHARGE:                                HISTORY & PHYSICAL   ADMITTING DIAGNOSES:  1.  Persistent right ovarian cysts.  2.  Fibroids.  3.  Menorrhagia.  4.  Elevated CA-125.  5.  Family history of ovarian cancer.   Patient is a 55 year old white married female gravida 3, para 2 whose  maternal grandmother had ovarian cancer.  Over the last couple of years she  has had increasing difficulty with menorrhagia and has been found to have  uterine fibroids.  She also has been found to have a simple benign-appearing  ovarian cyst but it does persist and will not resolve spontaneously.  A CA-  125 was obtained at the patient's request which was elevated to 58 with an  upper normal of 30.2.  The probability that this is not ovarian cancer has  been discussed with the patient since it is known that fibroids will also  elevate the CA-125 but based on the combination of findings and family  history with heavy menses, fibroids, and this persistent mass she has  requested definitive surgery.  She is admitted at this time for  laparoscopically assisted vaginal hysterectomy, bilateral salpingo-  oophorectomy.   Her current review of systems is otherwise negative.  There are no  cardiopulmonary, GI, or GU complaints.   PAST MEDICAL HISTORY:  Patient has intermittently had mild elevation of  blood pressure, but at the current time is on no medication.  There are no  other known significant medical illnesses in the patient.   PAST SURGICAL HISTORY:  Hysterectomy/D&C done in January of 2005 for  endometrial polyps and fibroids and menorrhagia.  She had a laparoscopic  tubal ligation in 1995 with no abnormal  findings noted at that procedure.   She has given birth to two children vaginally and had a second trimester  intrauterine fetal demise with her third pregnancy.  She has never had a  blood transfusion.  She does not have allergies to medications.  She is  currently on no medications on a regular basis.  She is not a cigarette  smoker.   PHYSICAL EXAMINATION:  HEENT:  Grossly within normal limits.  Thyroid gland  is not palpably enlarged.  VITAL SIGNS:  Blood pressure 130/80.  CHEST:  Clear to auscultation.  HEART:  Normal sinus rhythm without murmurs, rubs, or gallops.  BREASTS:  Normal.  No palpable masses.  No nipple discharge.  No skin  change.  ABDOMEN:  Soft and nontender without appreciable organomegaly or palpable  masses.  PELVIC:  External genitalia, vagina, and cervix are normal to inspection.  Bimanual reveals the uterus to be anterior in position, approximately [redacted]  weeks gestation of size, and irregularly nodular.  There are no palpable  adnexal masses,  although ultrasound does confirm the persistence of a 1.6 cm  simple right ovarian cyst.  The rectum is palpably normal and confirms the  above findings.  EXTREMITIES:  Without clubbing, cyanosis, edema.   ASSESSMENT:  Uterine fibroids, menorrhagia, persistent right ovarian cyst,  elevated CA-125, family history of ovarian cancer.   PLAN:  Laparoscopically assisted vaginal hysterectomy, bilateral salpingo-  oophorectomy.      Freddy Finner, M.D.  Electronically Signed     WRN/MEDQ  D:  06/02/2005  T:  06/02/2005  Job:  [id number]

## 2010-10-07 NOTE — Op Note (Signed)
Karen Salinas, Karen Salinas                 ACCOUNT NO.:  [id number]   MEDICAL RECORD NO.:  [id number]          PATIENT TYPE:  OBV   LOCATION:  9399                          FACILITY:  [facility]   PHYSICIAN:  Freddy Finner, M.D.   DATE OF BIRTH:  [birth year]   DATE OF PROCEDURE:  06/05/2005  DATE OF DISCHARGE:                                 OPERATIVE REPORT   PREOPERATIVE DIAGNOSES:  1.  Fibroids.  2.  Persistent simple right ovarian cyst.  3.  Family history of ovarian carcinoma.  4.  Slight elevation of CA-125.   POSTOPERATIVE DIAGNOSES:  1.  Fibroids.  2.  Persistent simple right ovarian cyst.  3.  Family history of ovarian carcinoma.  4.  Slight elevation of CA-125.  5.  No evidence of right ovarian cyst to gross inspection laparoscopically.   OPERATIVE PROCEDURE:  1.  Laparoscopic assisted vaginal hysterectomy.  2.  Bilateral salpingo-oophorectomy.   SURGEON:  Freddy Finner, M.D.   ASSISTANT:  Guy Sandifer. Henderson Cloud, M.D.   ESTIMATED BLOOD LOSS:  100 mL.   INTRAOPERATIVE COMPLICATIONS:  None.   Detail of the present illness are reported in the admission note.  The  patient was admitted on the morning of surgery.  She was brought to the  operating room and placed under adequate general anesthesia.  She received a  gram of Rocephin IV preoperatively and was in sequential compression hose  for deep vein thrombosis prophylaxis.  She was placed under general  anesthesia and placed in the dorsal  lithotomy position, using the [employer] system.  Betadine prep of the abdomen, perineum and vagina was  carried out in the usual fashion with Betadine scrub, followed by Betadine  solution.  The bladder was evacuated with a Robinson catheter.  Hulka  tenaculum was attached to the cervix without difficulty under direct  visualization.  Sterile drapes were applied.  Two small incisions were made,  one at the umbilicus and one just above the symphysis.  Through the upper  incision, an 11 mm non  lighted disposable trocar was introduced.  Direct  inspection with the laparoscope revealed adequate placement with no evidence  of intrauterine entry.  Pneumoperitoneum was allowed to accumulate with  carbon dioxide gas.  A 5 mm trocar was placed through the lower incision and  through it, the spring loaded grasping forceps was used.  Inspection of the  abdomen and pelvis was carried out.  The uterus was modularly enlarged.  There was no obvious cyst on either ovary at this time.  There was evidence  of a recent corpus luteum on the right ovary.  Photographs were made of all  of these findings, including the appendix and the upper abdomen, where no  apparent abnormalities were noted.  The appendix was visibly normal.  Using  the [facility] tripolar device, the dissection was begun.  The infundibular  pelvic round ligament, upper broad ligament on each side was progressively  sealed and divided with the dissection carried down to the level just about  the uterine arteries.  Anterior peritoneum was released  from the lower  segment, also using the tripolar device.  Attention was then turned  __________ retractors placed.  The Hulka tenaculum was replaced with a  Jacobs tenaculum.  A colpotomy incision was made while tenting the mucosa  posterior to the cervix and incision with Mayo scissors.  The peritoneum was  entered.  The cervix was circumscribed with the scalpel.  Using the [facility] device, the uterosacral pedicles were sealed and divided.  The  bladder pillows were sealed and divided.  The bladder was carefully advanced  off of the cervix.  The cardinal ligament pedicles were sealed and divided.  The vessel pedicles were sealed and divided.  The anterior peritoneum was  entered and was actually open based on previous dissection, except for a  small adhesion.  An additional pedicle was taken above the vessels on either  side.  The uterus was then delivered through the fascia  introitus.  The  remaining pedicles were then sealed and divided with a Jason Nest device.  These  essentially consisted of adhesions.  The angles of the __________ were  anchored to the uterosacral with mattress sutures of Monocryl.  The  uterosacral pocket and posterior peritoneum were closed with an interrupted  serial Monocryl.  The cuff was closed vertically figure-of-eight serial  Monocryl.  A Foley catheter was placed.  Reinspection laparoscopically was  carried out.  The Nezhat irrigation was system was used.  Careful  examination of all dissected surfaces was achieved.  A couple of small tiny  little bleeders were controlled with a Jason Nest device, without difficulty.  Hemostasis ws complete and also under reduced intraabdominal pressure.  Irrigating solution was aspirated from the abdomen.  All the instruments  were removed.  Gas was allowed to escape.  The incisions abdominally were  closed with interrupted subcuticular sutures of 3-0 Dexon and Steri-Strips  were applied to the lower incision.  0.5% Marcaine was injected into the  incision sites for postoperative analgesia.  The patient was awakened and  taken to recovery in good condition.      Freddy Finner, M.D.  Electronically Signed     WRN/MEDQ  D:  06/05/2005  T:  06/05/2005  Job:  [id number]

## 2011-03-22 ENCOUNTER — Other Ambulatory Visit: Payer: Self-pay | Admitting: Internal Medicine

## 2011-03-22 ENCOUNTER — Other Ambulatory Visit (INDEPENDENT_AMBULATORY_CARE_PROVIDER_SITE_OTHER): Payer: BC Managed Care – PPO

## 2011-03-22 DIAGNOSIS — E876 Hypokalemia: Secondary | ICD-10-CM

## 2011-03-22 DIAGNOSIS — E785 Hyperlipidemia, unspecified: Secondary | ICD-10-CM

## 2011-03-22 LAB — BASIC METABOLIC PANEL
CO2: 25 mEq/L (ref 19–32)
Calcium: 9.1 mg/dL (ref 8.4–10.5)
Creatinine, Ser: 1 mg/dL (ref 0.4–1.2)
GFR: 63.33 mL/min (ref >60.00)
Glucose, Bld: 105 mg/dL — ABNORMAL HIGH (ref 70–99)

## 2011-03-22 LAB — LIPID PANEL
Cholesterol: 203 mg/dL — ABNORMAL HIGH (ref 0–200)
HDL: 53.4 mg/dL (ref >39.00)
Triglycerides: 109 mg/dL (ref 0.0–149.0)

## 2011-03-29 ENCOUNTER — Encounter: Payer: Self-pay | Admitting: Internal Medicine

## 2011-03-29 ENCOUNTER — Ambulatory Visit (INDEPENDENT_AMBULATORY_CARE_PROVIDER_SITE_OTHER): Payer: BC Managed Care – PPO | Admitting: Internal Medicine

## 2011-03-29 VITALS — BP 124/80 | HR 72 | Temp 98.2°F | Resp 16 | Ht 62.0 in | Wt 124.0 lb

## 2011-03-29 DIAGNOSIS — T887XXA Unspecified adverse effect of drug or medicament, initial encounter: Secondary | ICD-10-CM

## 2011-03-29 DIAGNOSIS — Z0000 Encounter for general adult medical examination without abnormal findings: Secondary | ICD-10-CM

## 2011-03-29 DIAGNOSIS — E785 Hyperlipidemia, unspecified: Secondary | ICD-10-CM

## 2011-03-29 NOTE — Patient Instructions (Signed)
Patient was instructed to continue all medications as prescribed. To stop at the checkout desk and schedule a followup appointment  

## 2011-03-29 NOTE — Progress Notes (Signed)
  Subjective:    Patient ID: Karen Salinas, female    DOB: Jun 29, 1955, 55 y.o.   MRN: [id number]  HPI Followup of elevated lipid on niacin alone and weight loss and exercise intervention.  Follow up of elevated blood pressure without evidence of hypertension considered to be white coat syndrome.  Followup of need for colonoscopy for history of colon cancer in the family do colonoscopy this year   Review of Systems  Constitutional: Negative for activity change, appetite change and fatigue.  HENT: Negative for ear pain, congestion, neck pain, postnasal drip and sinus pressure.   Eyes: Negative for redness and visual disturbance.  Respiratory: Negative for cough, shortness of breath and wheezing.   Gastrointestinal: Negative for abdominal pain and abdominal distention.  Genitourinary: Negative for dysuria, frequency and menstrual problem.  Musculoskeletal: Negative for myalgias, joint swelling and arthralgias.  Skin: Negative for rash and wound.  Neurological: Negative for dizziness, weakness and headaches.  Hematological: Negative for adenopathy. Does not bruise/bleed easily.  Psychiatric/Behavioral: Negative for sleep disturbance and decreased concentration.   Past Medical History  Diagnosis Date  . IBS (irritable bowel syndrome)   . Low back pain   . Sinusitis    Past Surgical History  Procedure Date  . Abdominal hysterectomy   . Colonoscopy 2002  . Tubal ligation   . Eye surgery for vision     reports that she has never smoked. She does not have any smokeless tobacco history on file. She reports that she drinks alcohol. She reports that she does not use illicit drugs. family history includes Hypertension in her father and mother and Stroke in her brother. No Known Allergies      Objective:   Physical Exam  Nursing note and vitals reviewed. Constitutional: She is oriented to person, place, and time. She appears well-developed and well-nourished. No distress.  HENT:    Head: Normocephalic and atraumatic.  Right Ear: External ear normal.  Left Ear: External ear normal.  Nose: Nose normal.  Mouth/Throat: Oropharynx is clear and moist.  Eyes: Conjunctivae and EOM are normal. Pupils are equal, round, and reactive to light.  Neck: Normal range of motion. Neck supple. No JVD present. No tracheal deviation present. No thyromegaly present.  Cardiovascular: Normal rate, regular rhythm, normal heart sounds and intact distal pulses.   No murmur heard. Pulmonary/Chest: Effort normal and breath sounds normal. She has no wheezes. She exhibits no tenderness.  Abdominal: Soft. Bowel sounds are normal.  Musculoskeletal: Normal range of motion. She exhibits no edema and no tenderness.  Lymphadenopathy:    She has no cervical adenopathy.  Neurological: She is alert and oriented to person, place, and time. She has normal reflexes. No cranial nerve deficit.  Skin: Skin is warm and dry. She is not diaphoretic.  Psychiatric: She has a normal mood and affect. Her behavior is normal.          Assessment & Plan:  Discussed the effect of exercise on her cholesterol and increased HDL the need to continue the niacin on a scheduled basis her weight loss has been applied and an extra presents by lower blood pressure she is doing well with an exercise program she states that she has had no recurrent shoulder or back pain and that her blood pressure has been in good control

## 2011-04-27 ENCOUNTER — Encounter: Payer: Self-pay | Admitting: Internal Medicine

## 2011-06-21 ENCOUNTER — Encounter: Payer: Self-pay | Admitting: Family

## 2011-06-21 ENCOUNTER — Ambulatory Visit (INDEPENDENT_AMBULATORY_CARE_PROVIDER_SITE_OTHER): Payer: BC Managed Care – PPO | Admitting: Family

## 2011-06-21 VITALS — BP 120/80 | Temp 99.9°F | Wt 127.0 lb

## 2011-06-21 DIAGNOSIS — E782 Mixed hyperlipidemia: Secondary | ICD-10-CM

## 2011-06-21 DIAGNOSIS — J0100 Acute maxillary sinusitis, unspecified: Secondary | ICD-10-CM

## 2011-06-21 MED ORDER — AMOXICILLIN-POT CLAVULANATE 875-125 MG PO TABS: 1.0000 | ORAL_TABLET | Freq: Two times a day (BID) | ORAL | Status: AC

## 2011-06-21 NOTE — Patient Instructions (Signed)

## 2011-06-21 NOTE — Progress Notes (Signed)
  Subjective:    Patient ID: Karen Salinas, female    DOB: 17-Nov-1955, 56 y.o.   MRN: [id number]  HPI Comments: C/o sinus drainage, nonproductive cough, sorethroat, and pressure headache since last Thurs getting progressively worse with low grade fever 99.9. Took otc dayquil, nyquil, and alleve.   Sinusitis Associated symptoms include congestion, sinus pressure and a sore throat. Pertinent negatives include no ear pain or sneezing.    She has a history of hyperlipidemia that is stable.   Review of Systems  HENT: Positive for congestion, sore throat, postnasal drip and sinus pressure. Negative for ear pain, facial swelling, rhinorrhea, sneezing, tinnitus and ear discharge.   Eyes: Negative.   Respiratory: Negative.   Cardiovascular: Negative.    Past Medical History  Diagnosis Date  . IBS (irritable bowel syndrome)   . Low back pain   . Sinusitis     History   Social History  . Marital Status: Married    Spouse Name: N/A    Number of Children: N/A  . Years of Education: N/A   Occupational History  . Not on file.   Social History Main Topics  . Smoking status: Never Smoker   . Smokeless tobacco: Not on file  . Alcohol Use: Yes  . Drug Use: No  . Sexually Active: Yes   Other Topics Concern  . Not on file   Social History Narrative   Regular exercise    Past Surgical History  Procedure Date  . Abdominal hysterectomy   . Colonoscopy 2002  . Tubal ligation   . Eye surgery for vision     Family History  Problem Relation Age of Onset  . Hypertension Mother   . Hypertension Father   . Stroke Brother     No Known Allergies  Current Outpatient Prescriptions on File Prior to Visit  Medication Sig Dispense Refill  . AMBULATORY NON FORMULARY MEDICATION Medication Name:  omega-3 liquid G. N C  brand.  5 mL    . calcium carbonate (OS-CAL) 600 MG TABS Take 600 mg by mouth 2 (two) times daily with a meal.        . Cholecalciferol (VITAMIN D) 1000 UNITS capsule Take  1,000 Units by mouth daily.        Marland Kitchen estradiol (VIVELLE-DOT) 0.05 MG/24HR Place 1 patch onto the skin once a week.        . niacin (NIASPAN) 500 MG CR tablet Take 500 mg by mouth at bedtime.          BP 120/80  Temp(Src) 99.9 F (37.7 C) (Oral)  Wt 127 lb (57.607 kg)chart     Objective:   Physical Exam  Constitutional: She is oriented to person, place, and time. She appears well-developed and well-nourished.  Cardiovascular: Normal rate, regular rhythm, normal heart sounds and intact distal pulses.  Exam reveals no gallop and no friction rub.   No murmur heard. Pulmonary/Chest: Effort normal and breath sounds normal. No respiratory distress. She has no wheezes. She has no rales. She exhibits no tenderness.  Neurological: She is alert and oriented to person, place, and time.  Skin: Skin is warm and dry.          Assessment & Plan:  Assessment Acute sinusitis, Hyperlipidemia  Plan: Augmentin, increase po fluid intake, take otc claritin-d or zyrtec-d prn, take antibiotics with food. And call the office if symptoms worsen or persist, recheck as scheduled and when necessary.

## 2011-07-04 ENCOUNTER — Other Ambulatory Visit: Payer: Self-pay | Admitting: Obstetrics & Gynecology

## 2011-07-04 DIAGNOSIS — Z1231 Encounter for screening mammogram for malignant neoplasm of breast: Secondary | ICD-10-CM

## 2011-08-08 ENCOUNTER — Ambulatory Visit
Admission: RE | Admit: 2011-08-08 | Discharge: 2011-08-08 | Disposition: A | Discharge status: Home / Self Care | Payer: BC Managed Care – PPO | Source: Ambulatory Visit | Attending: Obstetrics & Gynecology | Admitting: Obstetrics & Gynecology

## 2011-08-08 DIAGNOSIS — Z1231 Encounter for screening mammogram for malignant neoplasm of breast: Secondary | ICD-10-CM

## 2011-09-11 ENCOUNTER — Other Ambulatory Visit: Payer: Self-pay | Admitting: Dermatology

## 2011-09-26 ENCOUNTER — Other Ambulatory Visit (INDEPENDENT_AMBULATORY_CARE_PROVIDER_SITE_OTHER): Payer: BC Managed Care – PPO

## 2011-09-26 DIAGNOSIS — Z0000 Encounter for general adult medical examination without abnormal findings: Secondary | ICD-10-CM

## 2011-09-26 LAB — CBC WITH DIFFERENTIAL/PLATELET
Eosinophils Relative: 1.7 % (ref 0.0–5.0)
HCT: 43.9 % (ref 36.0–46.0)
Lymphs Abs: 2.1 10*3/uL (ref 0.7–4.0)
Monocytes Relative: 8.4 % (ref 3.0–12.0)
Neutrophils Relative %: 47.3 % (ref 43.0–77.0)
Platelets: 231 10*3/uL (ref 150.0–400.0)
RBC: 4.61 Mil/uL (ref 3.87–5.11)
WBC: 5 10*3/uL (ref 4.5–10.5)

## 2011-09-26 LAB — BASIC METABOLIC PANEL
Chloride: 108 mEq/L (ref 96–112)
Creatinine, Ser: 0.9 mg/dL (ref 0.4–1.2)
Potassium: 4.7 mEq/L (ref 3.5–5.1)
Sodium: 143 mEq/L (ref 135–145)

## 2011-09-26 LAB — HEPATIC FUNCTION PANEL
ALT: 28 U/L (ref 0–35)
Alkaline Phosphatase: 60 U/L (ref 39–117)
Bilirubin, Direct: 0 mg/dL (ref 0.0–0.3)
Total Bilirubin: 0.7 mg/dL (ref 0.3–1.2)

## 2011-09-26 LAB — LIPID PANEL
Total CHOL/HDL Ratio: 4
Triglycerides: 89 mg/dL (ref 0.0–149.0)

## 2011-09-26 LAB — TSH: TSH: 4.51 u[IU]/mL (ref 0.35–5.50)

## 2011-09-26 LAB — POCT URINALYSIS DIPSTICK
Glucose, UA: NEGATIVE
Nitrite, UA: NEGATIVE
Urobilinogen, UA: 0.2

## 2011-09-26 LAB — LDL CHOLESTEROL, DIRECT: Direct LDL: 150.4 mg/dL

## 2011-09-27 LAB — VITAMIN D 25 HYDROXY (VIT D DEFICIENCY, FRACTURES): Vit D, 25-Hydroxy: 47 ng/mL (ref 30–89)

## 2011-09-29 ENCOUNTER — Other Ambulatory Visit: Payer: BC Managed Care – PPO

## 2011-10-05 ENCOUNTER — Ambulatory Visit (INDEPENDENT_AMBULATORY_CARE_PROVIDER_SITE_OTHER): Payer: BC Managed Care – PPO | Admitting: Internal Medicine

## 2011-10-05 ENCOUNTER — Encounter: Payer: Self-pay | Admitting: Internal Medicine

## 2011-10-05 VITALS — BP 144/88 | HR 68 | Temp 98.2°F | Resp 14 | Ht 62.0 in | Wt 130.0 lb

## 2011-10-05 DIAGNOSIS — E785 Hyperlipidemia, unspecified: Secondary | ICD-10-CM

## 2011-10-05 DIAGNOSIS — Z0000 Encounter for general adult medical examination without abnormal findings: Secondary | ICD-10-CM

## 2011-10-05 MED ORDER — ROSUVASTATIN CALCIUM 20 MG PO TABS: 20.0000 mg | ORAL_TABLET | ORAL | Status: DC

## 2011-10-05 NOTE — Patient Instructions (Signed)
The patient is instructed to continue all medications as prescribed. Schedule followup with check out clerk upon leaving the clinic  

## 2011-10-05 NOTE — Progress Notes (Signed)
Subjective:    Patient ID: Karen Salinas, female    DOB: 05/01/1956, 56 y.o.   MRN: [id number]  HPI Patient is a 57 year old female who presents for complete physical examination.  She has a history of borderline hypertension in the today her blood pressure was 144/88 she is going to obtain a blood pressure cuff and monitor her blood pressure a regular basis.  She also is followed for borderline hyperlipidemia with elevated LDL and a borderline total cholesterol.  Her triglycerides are normal she has been on a combination of omega-3 supplements and niacin and has not achieved goal   Review of Systems  Constitutional: Negative for activity change, appetite change and fatigue.  HENT: Negative for ear pain, congestion, neck pain, postnasal drip and sinus pressure.   Eyes: Negative for redness and visual disturbance.  Respiratory: Negative for cough, shortness of breath and wheezing.   Gastrointestinal: Negative for abdominal pain and abdominal distention.  Genitourinary: Negative for dysuria, frequency and menstrual problem.  Musculoskeletal: Negative for myalgias, joint swelling and arthralgias.  Skin: Negative for rash and wound.  Neurological: Negative for dizziness, weakness and headaches.  Hematological: Negative for adenopathy. Does not bruise/bleed easily.  Psychiatric/Behavioral: Negative for sleep disturbance and decreased concentration.   Past Medical History  Diagnosis Date  . IBS (irritable bowel syndrome)   . Low back pain   . Sinusitis     History   Social History  . Marital Status: Married    Spouse Name: N/A    Number of Children: N/A  . Years of Education: N/A   Occupational History  . Not on file.   Social History Main Topics  . Smoking status: Never Smoker   . Smokeless tobacco: Not on file  . Alcohol Use: Yes  . Drug Use: No  . Sexually Active: Yes   Other Topics Concern  . Not on file   Social History Narrative   Regular exercise    Past  Surgical History  Procedure Date  . Abdominal hysterectomy   . Colonoscopy 2002  . Tubal ligation   . Eye surgery for vision     Family History  Problem Relation Age of Onset  . Hypertension Mother   . Hypertension Father   . Stroke Brother     No Known Allergies  Current Outpatient Prescriptions on File Prior to Visit  Medication Sig Dispense Refill  . AMBULATORY NON FORMULARY MEDICATION Medication Name:  omega-3 liquid G. N C  brand.  5 mL    . calcium carbonate (OS-CAL) 600 MG TABS Take 600 mg by mouth 2 (two) times daily with a meal.        . Cholecalciferol (VITAMIN D) 1000 UNITS capsule Take 1,000 Units by mouth daily.        Marland Kitchen estradiol (VIVELLE-DOT) 0.05 MG/24HR Place 1 patch onto the skin once a week.        . rosuvastatin (CRESTOR) 20 MG tablet Take 1 tablet (20 mg total) by mouth once a week.  90 tablet  3    BP 104/70  Pulse 68  Temp 98.2 F (36.8 C)  Resp 14  Ht 5\' 2"  (1.575 m)  Wt 130 lb (58.968 kg)  BMI 23.78 kg/m2       Objective:   Physical Exam  Constitutional: She is oriented to person, place, and time. She appears well-developed and well-nourished. No distress.  HENT:  Head: Normocephalic and atraumatic.  Right Ear: External ear normal.  Left Ear: External ear  normal.  Nose: Nose normal.  Mouth/Throat: Oropharynx is clear and moist.  Eyes: Conjunctivae and EOM are normal. Pupils are equal, round, and reactive to light.  Neck: Normal range of motion. Neck supple. No JVD present. No tracheal deviation present. No thyromegaly present.  Cardiovascular: Normal rate, regular rhythm, normal heart sounds and intact distal pulses.   No murmur heard. Pulmonary/Chest: Effort normal and breath sounds normal. She has no wheezes. She exhibits no tenderness.  Abdominal: Soft. Bowel sounds are normal.  Musculoskeletal: Normal range of motion. She exhibits no edema and no tenderness.  Lymphadenopathy:    She has no cervical adenopathy.  Neurological: She is  alert and oriented to person, place, and time. She has normal reflexes. No cranial nerve deficit.  Skin: Skin is warm and dry. She is not diaphoretic.  Psychiatric: She has a normal mood and affect. Her behavior is normal.          Assessment & Plan:   This is a routine physical examination for this healthy  Female. Reviewed all health maintenance protocols including mammography colonoscopy bone density and reviewed appropriate screening labs. Her immunization history was reviewed as well as her current medications and allergies refills of her chronic medications were given and the plan for yearly health maintenance was discussed all orders and referrals were made as appropriate.  We will begin pulse statin therapy with Crestor 20 mg once a week and monitor the results in 90 days.  She is instructed to get a blood pressure cuff and monitor her blood pressure on a 2-3 times a week basis keep records of her blood pressure continues to exceed 140 on the systolic number initial contact our office and we will initiate antihypertensive therapy she is set a weight loss goal 5 pounds as well as salt limitation goal

## 2011-10-06 ENCOUNTER — Encounter: Payer: BC Managed Care – PPO | Admitting: Internal Medicine

## 2012-01-01 ENCOUNTER — Other Ambulatory Visit (INDEPENDENT_AMBULATORY_CARE_PROVIDER_SITE_OTHER): Payer: BC Managed Care – PPO

## 2012-01-01 DIAGNOSIS — E785 Hyperlipidemia, unspecified: Secondary | ICD-10-CM

## 2012-01-01 LAB — LIPID PANEL
Cholesterol: 145 mg/dL (ref 0–200)
LDL Cholesterol: 73 mg/dL (ref 0–99)
Triglycerides: 99 mg/dL (ref 0.0–149.0)
VLDL: 19.8 mg/dL (ref 0.0–40.0)

## 2012-01-01 LAB — HEPATIC FUNCTION PANEL: Total Bilirubin: 1.1 mg/dL (ref 0.3–1.2)

## 2012-01-01 NOTE — Patient Instructions (Signed)
, °

## 2012-01-08 ENCOUNTER — Encounter: Payer: Self-pay | Admitting: Internal Medicine

## 2012-01-08 ENCOUNTER — Ambulatory Visit (INDEPENDENT_AMBULATORY_CARE_PROVIDER_SITE_OTHER): Payer: BC Managed Care – PPO | Admitting: Internal Medicine

## 2012-01-08 VITALS — BP 120/78 | HR 72 | Temp 98.2°F | Resp 14 | Ht 62.0 in | Wt 134.0 lb

## 2012-01-08 DIAGNOSIS — E785 Hyperlipidemia, unspecified: Secondary | ICD-10-CM

## 2012-01-08 DIAGNOSIS — R030 Elevated blood-pressure reading, without diagnosis of hypertension: Secondary | ICD-10-CM

## 2012-01-08 NOTE — Progress Notes (Signed)
Subjective:    Patient ID: Karen Salinas, female    DOB: [birth year], 56 y.o.   MRN: [id number]  HPI Review of the patient and we'll toy blood pressure readings.  She has taken several months of readings and her average blood pressures are within normal range.  Indicating that some of her elevated blood pressures at the office are most probably white coat syndrome and not true hypertension.  She also had blood work drawn prior to her office visit which included a liver panel and a lipid panel she's had a significant reduction in total cholesterol and LDL cholesterol was Crestor weekly.  This is reduced her cardiovascular risk factors significantly   Review of Systems  Constitutional: Negative for activity change, appetite change and fatigue.  HENT: Negative for ear pain, congestion, neck pain, postnasal drip and sinus pressure.   Eyes: Negative for redness and visual disturbance.  Respiratory: Negative for cough, shortness of breath and wheezing.   Gastrointestinal: Negative for abdominal pain and abdominal distention.  Genitourinary: Negative for dysuria, frequency and menstrual problem.  Musculoskeletal: Negative for myalgias, joint swelling and arthralgias.  Skin: Negative for rash and wound.  Neurological: Negative for dizziness, weakness and headaches.  Hematological: Negative for adenopathy. Does not bruise/bleed easily.  Psychiatric/Behavioral: Negative for disturbed wake/sleep cycle and decreased concentration.   Past Medical History  Diagnosis Date  . IBS (irritable bowel syndrome)   . Low back pain   . Sinusitis     History   Social History  . Marital Status: Married    Spouse Name: N/A    Number of Children: N/A  . Years of Education: N/A   Occupational History  . Not on file.   Social History Main Topics  . Smoking status: Never Smoker   . Smokeless tobacco: Not on file  . Alcohol Use: Yes  . Drug Use: No  . Sexually Active: Yes   Other Topics Concern    . Not on file   Social History Narrative   Regular exercise    Past Surgical History  Procedure Date  . Abdominal hysterectomy   . Colonoscopy 2002  . Tubal ligation   . Eye surgery for vision     Family History  Problem Relation Age of Onset  . Hypertension Mother   . Hypertension Father   . Stroke Brother     No Known Allergies  Current Outpatient Prescriptions on File Prior to Visit  Medication Sig Dispense Refill  . AMBULATORY NON FORMULARY MEDICATION Medication Name:  omega-3 liquid G. N C  brand.  5 mL    . calcium carbonate (OS-CAL) 600 MG TABS Take 600 mg by mouth 2 (two) times daily with a meal.        . Cholecalciferol (VITAMIN D) 1000 UNITS capsule Take 1,000 Units by mouth daily.        Marland Kitchen estradiol (VIVELLE-DOT) 0.05 MG/24HR Place 1 patch onto the skin once a week.        . rosuvastatin (CRESTOR) 20 MG tablet Take 1 tablet (20 mg total) by mouth once a week.  90 tablet  3    BP 120/78  Pulse 72  Temp 98.2 F (36.8 C)  Resp 14  Ht 5\' 2"  (1.575 m)  Wt 134 lb (60.782 kg)  BMI 24.51 kg/m2        Objective:   Physical Exam  Nursing note and vitals reviewed. Constitutional: She is oriented to person, place, and time. She appears well-developed and well-nourished.  No distress.  HENT:  Head: Normocephalic and atraumatic.  Right Ear: External ear normal.  Left Ear: External ear normal.  Nose: Nose normal.  Mouth/Throat: Oropharynx is clear and moist.  Eyes: Conjunctivae and EOM are normal. Pupils are equal, round, and reactive to light.  Neck: Normal range of motion. Neck supple. No JVD present. No tracheal deviation present. No thyromegaly present.  Cardiovascular: Normal rate, regular rhythm, normal heart sounds and intact distal pulses.   No murmur heard. Pulmonary/Chest: Effort normal and breath sounds normal. She has no wheezes. She exhibits no tenderness.  Abdominal: Soft. Bowel sounds are normal.  Musculoskeletal: Normal range of motion. She  exhibits no edema and no tenderness.  Lymphadenopathy:    She has no cervical adenopathy.  Neurological: She is alert and oriented to person, place, and time. She has normal reflexes. No cranial nerve deficit.  Skin: Skin is warm and dry. She is not diaphoretic.  Psychiatric: She has a normal mood and affect. Her behavior is normal.          Assessment & Plan:  Lipids at goal reviewed the blood pressure readings and stable  Results without HTN Osteoporosis

## 2012-05-13 ENCOUNTER — Telehealth: Payer: Self-pay | Admitting: Internal Medicine

## 2012-05-13 NOTE — Telephone Encounter (Signed)
Patient Information:  Caller Name: Aylene  Phone: [phone]  Patient: Karen Salinas, Karen Salinas  Gender: Female  DOB: 1956/05/12  Age: 56 Years  PCP: Darryll Capers (Adults only)  Office Follow Up:  Does the office need to follow up with this patient?: Yes  Instructions For The Office: Please call back about continuing Crestor due symptoms of side effects and advise when can be seen before 05/31/12.  RN Note:  Leaving 06/04/12 for 6 weeks; would like appointment within first 10 days of January. Advised may not hear back from office staff until 05/14/12 due to time of contact and holiday.    Symptoms  Reason For Call & Symptoms: Called regarding sore muscles, aching and weight gain around waist .  Concerned it is side effect of Crestor which she started on 6 months ago.  Reviewed her RX literature which specified the symptoms as side effects.   Reviewed Health History In EMR: Yes  Reviewed Medications In EMR: Yes  Reviewed Allergies In EMR: Yes  Reviewed Surgeries / Procedures: Yes  Date of Onset of Symptoms: 03/13/2012  Treatments Tried: Advil  Treatments Tried Worked: Yes  Guideline(s) Used:  No Protocol Available - Information Only  Disposition Per Guideline:   Discuss with PCP and Callback by Nurse Today  Reason For Disposition Reached:   Nursing judgment  Advice Given:  Call Back If:  New symptoms develop  You become worse.

## 2012-05-14 NOTE — Telephone Encounter (Signed)
Hold Crestor Schedule office visit first week in January

## 2012-05-16 NOTE — Telephone Encounter (Signed)
Left Dr Charm Rings instructions on pts cell phone

## 2012-05-23 ENCOUNTER — Encounter: Payer: Self-pay | Admitting: Family

## 2012-05-23 ENCOUNTER — Ambulatory Visit (INDEPENDENT_AMBULATORY_CARE_PROVIDER_SITE_OTHER): Payer: BC Managed Care – PPO | Admitting: Family

## 2012-05-23 VITALS — BP 140/94 | HR 81 | Wt 136.0 lb

## 2012-05-23 DIAGNOSIS — M791 Myalgia, unspecified site: Secondary | ICD-10-CM

## 2012-05-23 DIAGNOSIS — IMO0001 Reserved for inherently not codable concepts without codable children: Secondary | ICD-10-CM

## 2012-05-23 DIAGNOSIS — E7800 Pure hypercholesterolemia, unspecified: Secondary | ICD-10-CM

## 2012-05-23 DIAGNOSIS — R635 Abnormal weight gain: Secondary | ICD-10-CM

## 2012-05-23 LAB — CBC WITH DIFFERENTIAL/PLATELET
Basophils Relative: 0.5 % (ref 0.0–3.0)
Eosinophils Absolute: 0 10*3/uL (ref 0.0–0.7)
MCHC: 35 g/dL (ref 30.0–36.0)
MCV: 91.5 fl (ref 78.0–100.0)
Monocytes Absolute: 0.3 10*3/uL (ref 0.1–1.0)
Neutrophils Relative %: 46.1 % (ref 43.0–77.0)
Platelets: 235 10*3/uL (ref 150.0–400.0)

## 2012-05-23 LAB — LDL CHOLESTEROL, DIRECT: Direct LDL: 146 mg/dL

## 2012-05-23 LAB — LIPID PANEL: Triglycerides: 63 mg/dL (ref 0.0–149.0)

## 2012-05-23 NOTE — Patient Instructions (Addendum)

## 2012-05-23 NOTE — Progress Notes (Signed)
Subjective:    Patient ID: Karen Salinas, female    DOB: 12/29/1955, 57 y.o.   MRN: [id number]  HPI 57 year old white female, nonsmoker, patient of Dr. Lovell Sheehan is in today for recheck of hypercholesterolemia. Patient has concerns that her Crestor has caused that I have muscle aches and pain as well as weight gain. She has been exercising and running 2-4 miles multiple days a week and continues to gain weight. In the last 6 month, she's gained about 6 pounds despite her efforts. She stopped the Crestor 2 weeks ago. Last cholesterol was 145-total, prior to medication to 210-total.   Review of Systems  Constitutional: Positive for unexpected weight change.  Respiratory: Negative.   Cardiovascular: Negative.   Gastrointestinal: Negative.   Genitourinary: Negative.   Musculoskeletal: Positive for myalgias.  Skin: Negative.   Neurological: Negative.   Psychiatric/Behavioral: Negative.    Past Medical History  Diagnosis Date  . IBS (irritable bowel syndrome)   . Low back pain   . Sinusitis     History   Social History  . Marital Status: Married    Spouse Name: N/A    Number of Children: N/A  . Years of Education: N/A   Occupational History  . Not on file.   Social History Main Topics  . Smoking status: Never Smoker   . Smokeless tobacco: Not on file  . Alcohol Use: Yes  . Drug Use: No  . Sexually Active: Yes   Other Topics Concern  . Not on file   Social History Narrative   Regular exercise    Past Surgical History  Procedure Date  . Abdominal hysterectomy   . Colonoscopy 2002  . Tubal ligation   . Eye surgery for vision     Family History  Problem Relation Age of Onset  . Hypertension Mother   . Hypertension Father   . Stroke Brother     No Known Allergies  Current Outpatient Prescriptions on File Prior to Visit  Medication Sig Dispense Refill  . AMBULATORY NON FORMULARY MEDICATION Medication Name:  omega-3 liquid G. N C  brand.  5 mL    . calcium  carbonate (OS-CAL) 600 MG TABS Take 600 mg by mouth 2 (two) times daily with a meal.        . Cholecalciferol (VITAMIN D) 1000 UNITS capsule Take 1,000 Units by mouth daily.        Marland Kitchen estradiol (VIVELLE-DOT) 0.05 MG/24HR Place 1 patch onto the skin once a week.        . rosuvastatin (CRESTOR) 20 MG tablet Take 1 tablet (20 mg total) by mouth once a week.  90 tablet  3    BP 140/94  Pulse 81  Wt 136 lb (61.689 kg)  SpO2 98%chart    Objective:   Physical Exam  Constitutional: She is oriented to person, place, and time. She appears well-developed and well-nourished.  Neck: Normal range of motion. Neck supple.  Cardiovascular: Normal rate, regular rhythm and normal heart sounds.   Pulmonary/Chest: Effort normal and breath sounds normal.  Abdominal: Soft. Bowel sounds are normal.  Neurological: She is alert and oriented to person, place, and time.  Skin: Skin is warm and dry.  Psychiatric: She has a normal mood and affect.          Assessment & Plan:  Assessment: Hypercholesterolemia, weight gain, muscle aches  Plan: Lab sent to include TSH, CBC and lipids. When the patient in the results. Continue to hold Crestor at this  point. We'll consider a little low in the future if necessary. Cholesterol lowering diet provided today. Followup with me or her PCP in 8 weeks and sooner when necessary.

## 2012-05-28 ENCOUNTER — Telehealth: Payer: Self-pay | Admitting: Internal Medicine

## 2012-05-28 NOTE — Telephone Encounter (Signed)
Pt aware of lab results 

## 2012-05-28 NOTE — Telephone Encounter (Signed)
Pt needs blood work results from 05-24-2011

## 2012-06-28 ENCOUNTER — Other Ambulatory Visit: Payer: Self-pay | Admitting: Obstetrics & Gynecology

## 2012-06-28 DIAGNOSIS — Z1231 Encounter for screening mammogram for malignant neoplasm of breast: Secondary | ICD-10-CM

## 2012-07-23 ENCOUNTER — Ambulatory Visit: Payer: BC Managed Care – PPO

## 2012-07-26 ENCOUNTER — Ambulatory Visit: Payer: BC Managed Care – PPO | Admitting: Internal Medicine

## 2012-07-29 ENCOUNTER — Other Ambulatory Visit (INDEPENDENT_AMBULATORY_CARE_PROVIDER_SITE_OTHER): Payer: BC Managed Care – PPO

## 2012-07-29 DIAGNOSIS — E785 Hyperlipidemia, unspecified: Secondary | ICD-10-CM

## 2012-07-29 LAB — LIPID PANEL
HDL: 44.3 mg/dL (ref >39.00)
Triglycerides: 103 mg/dL (ref 0.0–149.0)
VLDL: 20.6 mg/dL (ref 0.0–40.0)

## 2012-08-02 ENCOUNTER — Encounter: Payer: Self-pay | Admitting: Internal Medicine

## 2012-08-02 ENCOUNTER — Ambulatory Visit (INDEPENDENT_AMBULATORY_CARE_PROVIDER_SITE_OTHER): Payer: BC Managed Care – PPO | Admitting: Internal Medicine

## 2012-08-02 VITALS — BP 120/88 | HR 64 | Temp 97.9°F | Wt 133.0 lb

## 2012-08-02 DIAGNOSIS — E785 Hyperlipidemia, unspecified: Secondary | ICD-10-CM

## 2012-08-02 MED ORDER — FENOFIBRATE 150 MG PO CAPS: 1.0000 | ORAL_CAPSULE | Freq: Every day | ORAL | Status: DC

## 2012-08-02 NOTE — Patient Instructions (Signed)
The patient is instructed to continue all medications as prescribed. Schedule followup with check out clerk upon leaving the clinic  

## 2012-08-02 NOTE — Progress Notes (Signed)
Subjective:    Patient ID: Karen Salinas, female    DOB: 16-Jul-1955, 57 y.o.   MRN: [id number]  HPI Presents for followup of hyperlipidemia.  She has been unable to tolerate even pulse of statins she stated that she developed severe myalgias and discontinued the medications    Review of Systems  Constitutional: Negative for activity change, appetite change and fatigue.  HENT: Negative for ear pain, congestion, neck pain, postnasal drip and sinus pressure.   Eyes: Negative for redness and visual disturbance.  Respiratory: Negative for cough, shortness of breath and wheezing.   Gastrointestinal: Negative for abdominal pain and abdominal distention.  Genitourinary: Negative for dysuria, frequency and menstrual problem.  Musculoskeletal: Negative for myalgias, joint swelling and arthralgias.  Skin: Negative for rash and wound.  Neurological: Negative for dizziness, weakness and headaches.  Hematological: Negative for adenopathy. Does not bruise/bleed easily.  Psychiatric/Behavioral: Negative for sleep disturbance and decreased concentration.   Past Medical History  Diagnosis Date  . IBS (irritable bowel syndrome)   . Low back pain   . Sinusitis     History   Social History  . Marital Status: Married    Spouse Name: N/A    Number of Children: N/A  . Years of Education: N/A   Occupational History  . Not on file.   Social History Main Topics  . Smoking status: Never Smoker   . Smokeless tobacco: Not on file  . Alcohol Use: Yes  . Drug Use: No  . Sexually Active: Yes   Other Topics Concern  . Not on file   Social History Narrative   Regular exercise          Past Surgical History  Procedure Laterality Date  . Abdominal hysterectomy    . Colonoscopy  2002  . Tubal ligation    . Eye surgery for vision      Family History  Problem Relation Age of Onset  . Hypertension Mother   . Hypertension Father   . Stroke Brother     No Known Allergies  Current  Outpatient Prescriptions on File Prior to Visit  Medication Sig Dispense Refill  . AMBULATORY NON FORMULARY MEDICATION Medication Name:  omega-3 liquid G. N C  brand.  5 mL    . calcium carbonate (OS-CAL) 600 MG TABS Take 600 mg by mouth 2 (two) times daily with a meal.        . Cholecalciferol (VITAMIN D) 1000 UNITS capsule Take 1,000 Units by mouth daily.        Marland Kitchen estradiol (VIVELLE-DOT) 0.05 MG/24HR Place 1 patch onto the skin once a week.         No current facility-administered medications on file prior to visit.    BP 120/88  Pulse 64  Temp(Src) 97.9 F (36.6 C) (Oral)  Wt 133 lb (60.328 kg)  BMI 24.32 kg/m2       Objective:   Physical Exam  Constitutional: She is oriented to person, place, and time. She appears well-developed and well-nourished. No distress.  HENT:  Head: Normocephalic and atraumatic.  Right Ear: External ear normal.  Left Ear: External ear normal.  Nose: Nose normal.  Mouth/Throat: Oropharynx is clear and moist.  Eyes: Conjunctivae and EOM are normal. Pupils are equal, round, and reactive to light.  Neck: Normal range of motion. Neck supple. No JVD present. No tracheal deviation present. No thyromegaly present.  Cardiovascular: Normal rate, regular rhythm, normal heart sounds and intact distal pulses.   No murmur  heard. Pulmonary/Chest: Effort normal and breath sounds normal. She has no wheezes. She exhibits no tenderness.  Abdominal: Soft. Bowel sounds are normal.  Musculoskeletal: Normal range of motion. She exhibits no edema and no tenderness.  Lymphadenopathy:    She has no cervical adenopathy.  Neurological: She is alert and oriented to person, place, and time. She has normal reflexes. No cranial nerve deficit.  Skin: Skin is warm and dry. She is not diaphoretic.  Psychiatric: She has a normal mood and affect. Her behavior is normal.          Assessment & Plan:  Followup of hyperlipidemia she has persistent elevation of her LDL cholesterol  given her risk factors I believe that it is essential that we obtain cholesterol control.  She is intolerant of statins even at low dose for pulse therapy.  We will try her with a fibronate 150 mg We have given her a trial of samples of Lipofen

## 2012-09-03 ENCOUNTER — Ambulatory Visit
Admission: RE | Admit: 2012-09-03 | Discharge: 2012-09-03 | Disposition: A | Discharge status: Home / Self Care | Payer: BC Managed Care – PPO | Source: Ambulatory Visit | Attending: Obstetrics & Gynecology | Admitting: Obstetrics & Gynecology

## 2012-09-03 DIAGNOSIS — Z1231 Encounter for screening mammogram for malignant neoplasm of breast: Secondary | ICD-10-CM

## 2012-09-05 ENCOUNTER — Other Ambulatory Visit: Payer: Self-pay | Admitting: Dermatology

## 2012-09-30 ENCOUNTER — Other Ambulatory Visit (INDEPENDENT_AMBULATORY_CARE_PROVIDER_SITE_OTHER): Payer: BC Managed Care – PPO

## 2012-09-30 DIAGNOSIS — Z0000 Encounter for general adult medical examination without abnormal findings: Secondary | ICD-10-CM

## 2012-09-30 LAB — POCT URINALYSIS DIPSTICK
Leukocytes, UA: NEGATIVE
Nitrite, UA: NEGATIVE
Protein, UA: NEGATIVE
Urobilinogen, UA: 0.2

## 2012-09-30 LAB — LIPID PANEL
Cholesterol: 172 mg/dL (ref 0–200)
HDL: 51.7 mg/dL (ref >39.00)
LDL Cholesterol: 106 mg/dL — ABNORMAL HIGH (ref 0–99)
Total CHOL/HDL Ratio: 3
Triglycerides: 74 mg/dL (ref 0.0–149.0)

## 2012-09-30 LAB — CBC WITH DIFFERENTIAL/PLATELET
Basophils Absolute: 0 10*3/uL (ref 0.0–0.1)
Eosinophils Absolute: 0.1 10*3/uL (ref 0.0–0.7)
Eosinophils Relative: 1.4 % (ref 0.0–5.0)
MCHC: 34.7 g/dL (ref 30.0–36.0)
MCV: 91.3 fl (ref 78.0–100.0)
Monocytes Absolute: 0.4 10*3/uL (ref 0.1–1.0)
Neutrophils Relative %: 40 % — ABNORMAL LOW (ref 43.0–77.0)
Platelets: 270 10*3/uL (ref 150.0–400.0)
RDW: 12.3 % (ref 11.5–14.6)
WBC: 4.1 10*3/uL — ABNORMAL LOW (ref 4.5–10.5)

## 2012-09-30 LAB — BASIC METABOLIC PANEL
Calcium: 9 mg/dL (ref 8.4–10.5)
Creatinine, Ser: 0.9 mg/dL (ref 0.4–1.2)
GFR: 65.3 mL/min (ref >60.00)
Sodium: 139 mEq/L (ref 135–145)

## 2012-09-30 LAB — HEPATIC FUNCTION PANEL
Alkaline Phosphatase: 66 U/L (ref 39–117)
Bilirubin, Direct: 0 mg/dL (ref 0.0–0.3)

## 2012-10-07 ENCOUNTER — Ambulatory Visit (INDEPENDENT_AMBULATORY_CARE_PROVIDER_SITE_OTHER): Payer: BC Managed Care – PPO | Admitting: Internal Medicine

## 2012-10-07 ENCOUNTER — Encounter: Payer: Self-pay | Admitting: Internal Medicine

## 2012-10-07 VITALS — BP 126/80 | HR 72 | Temp 98.6°F | Resp 16 | Ht 62.0 in | Wt 131.0 lb

## 2012-10-07 DIAGNOSIS — R7989 Other specified abnormal findings of blood chemistry: Secondary | ICD-10-CM

## 2012-10-07 DIAGNOSIS — Z0000 Encounter for general adult medical examination without abnormal findings: Secondary | ICD-10-CM

## 2012-10-07 LAB — HEPATIC FUNCTION PANEL
AST: 55 U/L — ABNORMAL HIGH (ref 0–37)
Albumin: 4.4 g/dL (ref 3.5–5.2)
Total Protein: 7.1 g/dL (ref 6.0–8.3)

## 2012-10-07 NOTE — Patient Instructions (Addendum)
The patient is instructed to continue all medications as prescribed. Schedule followup with check out clerk upon leaving the clinic   Pain persists in her small finger use capsicin cream OTC

## 2012-10-07 NOTE — Progress Notes (Signed)
Subjective:    Patient ID: Karen Salinas, female    DOB: Dec 23, 1955, 57 y.o.   MRN: [id number]  HPI Presents for complete physical examination.  Screening blood test is excellent with good kidney function and no diabetes excellent lipid control there was a slight elevation of liver functions which is new we will investigate that by repeat liver functions today with a decision point that the liver functions are still elevated we will do additional screening.     Review of Systems  Constitutional: Negative for activity change, appetite change and fatigue.  HENT: Negative for ear pain, congestion, neck pain, postnasal drip and sinus pressure.   Eyes: Negative for redness and visual disturbance.  Respiratory: Negative for cough, shortness of breath and wheezing.   Gastrointestinal: Negative for abdominal pain and abdominal distention.  Genitourinary: Negative for dysuria, frequency and menstrual problem.  Musculoskeletal: Negative for myalgias, joint swelling and arthralgias.  Skin: Negative for rash and wound.  Neurological: Negative for dizziness, weakness and headaches.  Hematological: Negative for adenopathy. Does not bruise/bleed easily.  Psychiatric/Behavioral: Negative for sleep disturbance and decreased concentration.   Past Medical History  Diagnosis Date  . IBS (irritable bowel syndrome)   . Low back pain   . Sinusitis     History   Social History  . Marital Status: Married    Spouse Name: N/A    Number of Children: N/A  . Years of Education: N/A   Occupational History  . Not on file.   Social History Main Topics  . Smoking status: Never Smoker   . Smokeless tobacco: Not on file  . Alcohol Use: Yes  . Drug Use: No  . Sexually Active: Yes   Other Topics Concern  . Not on file   Social History Narrative   Regular exercise          Past Surgical History  Procedure Laterality Date  . Abdominal hysterectomy    . Colonoscopy  2002  . Tubal ligation    .  Eye surgery for vision      Family History  Problem Relation Age of Onset  . Hypertension Mother   . Hypertension Father   . Stroke Brother     No Known Allergies  Current Outpatient Prescriptions on File Prior to Visit  Medication Sig Dispense Refill  . calcium carbonate (OS-CAL) 600 MG TABS Take 600 mg by mouth 2 (two) times daily with a meal.        . Cholecalciferol (VITAMIN D) 1000 UNITS capsule Take 1,000 Units by mouth daily.        Marland Kitchen estradiol (VIVELLE-DOT) 0.05 MG/24HR Place 1 patch onto the skin once a week.        . Fenofibrate 150 MG CAPS Take 1 capsule (150 mg total) by mouth daily.  30 each  11   No current facility-administered medications on file prior to visit.    BP 126/80  Pulse 72  Temp(Src) 98.6 F (37 C)  Resp 16  Ht 5\' 2"  (1.575 m)  Wt 131 lb (59.421 kg)  BMI 23.95 kg/m2       Objective:   Physical Exam  Nursing note and vitals reviewed. Constitutional: She is oriented to person, place, and time. She appears well-developed and well-nourished. No distress.  HENT:  Head: Normocephalic and atraumatic.  Right Ear: External ear normal.  Left Ear: External ear normal.  Nose: Nose normal.  Mouth/Throat: Oropharynx is clear and moist.  Eyes: Conjunctivae and EOM are normal.  Pupils are equal, round, and reactive to light.  Neck: Normal range of motion. Neck supple. No JVD present. No tracheal deviation present. No thyromegaly present.  Cardiovascular: Normal rate, regular rhythm, normal heart sounds and intact distal pulses.   No murmur heard. Pulmonary/Chest: Effort normal and breath sounds normal. She has no wheezes. She exhibits no tenderness.  Abdominal: Soft. Bowel sounds are normal.  Musculoskeletal: Normal range of motion. She exhibits no edema and no tenderness.  Lymphadenopathy:    She has no cervical adenopathy.  Neurological: She is alert and oriented to person, place, and time. She has normal reflexes. No cranial nerve deficit.  Skin:  Skin is warm and dry. She is not diaphoretic.  Psychiatric: She has a normal mood and affect. Her behavior is normal.          Assessment & Plan:  Slight elevation of liver functions which is new could be related to medications repeat liver functions and consider additional screening and holding of lipids and  This is a routine physical examination for this healthy  Female. Reviewed all health maintenance protocols including mammography colonoscopy bone density and reviewed appropriate screening labs. Her immunization history was reviewed as well as her current medications and allergies refills of her chronic medications were given and the plan for yearly health maintenance was discussed all orders and referrals were made as appropriate.

## 2012-10-11 ENCOUNTER — Telehealth: Payer: Self-pay | Admitting: Internal Medicine

## 2012-10-11 ENCOUNTER — Encounter: Payer: Self-pay | Admitting: Internal Medicine

## 2012-10-11 DIAGNOSIS — R7989 Other specified abnormal findings of blood chemistry: Secondary | ICD-10-CM

## 2012-10-11 DIAGNOSIS — R945 Abnormal results of liver function studies: Secondary | ICD-10-CM

## 2012-10-11 NOTE — Telephone Encounter (Signed)
Hepatitis panel and abd ultrasound- pt informed and scheduled

## 2012-10-11 NOTE — Telephone Encounter (Signed)
PT called to inquire about her labs from 5/19, and would like to know how her "high numbers" will be managed. Please assist.

## 2012-10-15 ENCOUNTER — Encounter: Payer: Self-pay | Admitting: Internal Medicine

## 2012-10-15 ENCOUNTER — Other Ambulatory Visit (INDEPENDENT_AMBULATORY_CARE_PROVIDER_SITE_OTHER): Payer: BC Managed Care – PPO

## 2012-10-15 DIAGNOSIS — R748 Abnormal levels of other serum enzymes: Secondary | ICD-10-CM

## 2012-10-15 DIAGNOSIS — R7401 Elevation of levels of liver transaminase levels: Secondary | ICD-10-CM

## 2012-10-17 ENCOUNTER — Ambulatory Visit
Admission: RE | Admit: 2012-10-17 | Discharge: 2012-10-17 | Disposition: A | Discharge status: Home / Self Care | Payer: BC Managed Care – PPO | Source: Ambulatory Visit | Attending: Internal Medicine | Admitting: Internal Medicine

## 2012-10-17 DIAGNOSIS — R7989 Other specified abnormal findings of blood chemistry: Secondary | ICD-10-CM

## 2012-10-17 DIAGNOSIS — R945 Abnormal results of liver function studies: Secondary | ICD-10-CM

## 2012-10-17 LAB — HEPATITIS PANEL, ACUTE
HCV Ab: NEGATIVE
Hepatitis B Surface Ag: NEGATIVE

## 2012-10-21 ENCOUNTER — Encounter: Payer: Self-pay | Admitting: Internal Medicine

## 2012-10-21 ENCOUNTER — Other Ambulatory Visit: Payer: Self-pay | Admitting: Internal Medicine

## 2012-10-21 DIAGNOSIS — R748 Abnormal levels of other serum enzymes: Secondary | ICD-10-CM

## 2012-10-21 NOTE — Telephone Encounter (Signed)
Karen Salinas, pt will be out of town for the dates MD wants her blood work and return visit. Pt would like to know is it necessary to come in in 2 weeks for labs? She could get labs drawn in [place] (where she will be) if need, but will have to put off office visit until return.  Pls advise.

## 2012-11-18 ENCOUNTER — Other Ambulatory Visit (INDEPENDENT_AMBULATORY_CARE_PROVIDER_SITE_OTHER): Payer: BC Managed Care – PPO

## 2012-11-18 DIAGNOSIS — R748 Abnormal levels of other serum enzymes: Secondary | ICD-10-CM

## 2012-11-18 LAB — HEPATIC FUNCTION PANEL
ALT: 31 U/L (ref 0–35)
Albumin: 4.2 g/dL (ref 3.5–5.2)
Alkaline Phosphatase: 57 U/L (ref 39–117)
Bilirubin, Direct: 0.1 mg/dL (ref 0.0–0.3)
Total Protein: 6.8 g/dL (ref 6.0–8.3)

## 2012-11-25 ENCOUNTER — Encounter: Payer: Self-pay | Admitting: Internal Medicine

## 2012-12-02 ENCOUNTER — Telehealth: Payer: Self-pay | Admitting: Internal Medicine

## 2012-12-02 NOTE — Telephone Encounter (Signed)
Out of town on vacation experiencing UTI symptoms with bleeding.  Pt was seen at local urgent care facility and is concerned about a possible severe diagnosis or disease other than uti.  Requesting to be evaluated ASAP.  No appts available with PCP on the dates requested, pt has been scheduled to see Dr. Kirtland Bouchard on  7/21 @ 830am.  Patient requested PCP be informed of situation.

## 2012-12-03 ENCOUNTER — Encounter: Payer: Self-pay | Admitting: Internal Medicine

## 2012-12-03 DIAGNOSIS — R319 Hematuria, unspecified: Secondary | ICD-10-CM

## 2012-12-04 ENCOUNTER — Encounter: Payer: Self-pay | Admitting: Internal Medicine

## 2012-12-09 ENCOUNTER — Ambulatory Visit: Payer: BC Managed Care – PPO | Admitting: Internal Medicine

## 2012-12-11 ENCOUNTER — Other Ambulatory Visit: Payer: Self-pay | Admitting: Urology

## 2012-12-12 ENCOUNTER — Encounter (HOSPITAL_COMMUNITY): Payer: Self-pay | Admitting: Pharmacy Technician

## 2012-12-12 NOTE — Patient Instructions (Signed)
Karen Salinas  12/12/2012   Your procedure is scheduled on:  12/16/12            Surgery 115pm-215pm    Report to [facility] at    1045am  Call this number if you have problems the morning of surgery: [phone]   Remember:   Do not eat food or drink liquids after midnight.   Take these medicines the morning of surgery with A SIP OF WATER:    Do not wear jewelry, make-up or nail polish.  Do not wear lotions, powders, or perfumes  Do not shave 48 hours prior to surgery.   Do not bring valuables to the hospital.  Contacts, dentures or bridgework may not be worn into surgery.     Patients discharged the day of surgery will not be allowed to drive  home.  Name and phone number of your driver:    SEE CHG INSTRUCTION SHEET    Please read over the following fact sheets that you were given:, coughing and deep breathing exercises, leg exercises               Failure to comply with these instructions may result in cancellation of your surgery.                Patient Signature ____________________________              Nurse Signature _____________________________

## 2012-12-13 ENCOUNTER — Encounter (HOSPITAL_COMMUNITY): Payer: Self-pay

## 2012-12-13 ENCOUNTER — Encounter (HOSPITAL_COMMUNITY)
Admission: RE | Admit: 2012-12-13 | Discharge: 2012-12-13 | Disposition: A | Discharge status: Home / Self Care | Payer: BC Managed Care – PPO | Source: Ambulatory Visit | Attending: Urology | Admitting: Urology

## 2012-12-13 HISTORY — DX: Calculus of kidney: N20.0

## 2012-12-13 LAB — BASIC METABOLIC PANEL
BUN: 14 mg/dL (ref 6–23)
Calcium: 9.7 mg/dL (ref 8.4–10.5)
Creatinine, Ser: 0.89 mg/dL (ref 0.50–1.10)
GFR calc Af Amer: 82 mL/min — ABNORMAL LOW (ref >90)
GFR calc non Af Amer: 71 mL/min — ABNORMAL LOW (ref >90)

## 2012-12-13 LAB — CBC
MCHC: 35.5 g/dL (ref 30.0–36.0)
Platelets: 246 10*3/uL (ref 150–400)
RDW: 12.1 % (ref 11.5–15.5)
WBC: 4.9 10*3/uL (ref 4.0–10.5)

## 2012-12-13 NOTE — H&P (Signed)
Chief Complaint  Gross hematuria   History of Present Illness  [name] is a 57 year old female seen today at the request of Dr. Darryll Capers for painless gross hematuria.  She initially noted gross hematuria last week.  She had not previously noted hematuria in the past.  [facility] to the emergency department where urinalysis was performed.  This did demonstrate dipstick positive and microscopic hematuria.  There was no evidence of infection and a urine culture was obtained which was negative.  She denies a history of urolithiasis, prior recurrent UTIs, or a personal history of GU malignancy.  There is no family history of GU malignancy.  There is a family history of urolithiasis with her sister having had kidney stones.  She did have an abdominal ultrasound performed in May due to elevated liver function tests which were likely related to her cholesterol medication.  There were no renal masses or hydronephrosis noted on that ultrasound.  She denies tobacco use.   Past Medical History Problems  1. History of  Hypercholesterolemia 272.0  Surgical History Problems  1. History of  Hysterectomy V45.77  Current Meds 1. Vivelle-Dot 0.05 MG/24HR Transdermal Patch Biweekly; Therapy: (Recorded:17Jul2014) to  Allergies Medication  1. No Known Drug Allergies  Family History Problems  1. Family history of  Death In The Family Father heart attack 2. Sororal history of  Nephrolithiasis  Social History Problems    Alcohol Use 1 per day   Marital History - Divorced V61.03   Never A Smoker  Review of Systems Genitourinary, constitutional, skin, eye, otolaryngeal, hematologic/lymphatic, cardiovascular, pulmonary, endocrine, musculoskeletal, gastrointestinal, neurological and psychiatric system(s) were reviewed and pertinent findings if present are noted.    Vitals Vital Signs [Data Includes: Last 1 Day]  17Jul2014 02:12PM  BMI Calculated:  23.92 BSA Calculated: 1.59 Height: 5 ft 2 in Weight: 130 lb  Blood Pressure: 177 / 101 Temperature: 97.9 F Heart Rate: 86  Physical Exam Constitutional: Well nourished and well developed . No acute distress.  ENT:. The ears and nose are normal in appearance.  Neck: The appearance of the neck is normal and no neck mass is present.  Pulmonary: No respiratory distress and normal respiratory rhythm and effort.  Cardiovascular:. No peripheral edema.  Abdomen: The abdomen is soft and nontender. No masses are palpated. No CVA tenderness. No hernias are palpable. No hepatosplenomegaly noted.  Genitourinary:  Chaperone Present: Heather S.  Examination of the external genitalia shows normal female external genitalia and no lesions. The urethra is normal in appearance and not tender. There is no urethral mass. Vaginal exam demonstrates no abnormalities. The adnexa are palpably normal. The bladder is non tender and not distended. The anus is normal on inspection. The perineum is normal on inspection.  Lymphatics: The femoral and inguinal nodes are not enlarged or tender.  Skin: Normal skin turgor, no visible rash and no visible skin lesions.  Neuro/Psych:. Mood and affect are appropriate.    Results/Data Urine [Data Includes: Last 1 Day]   17Jul2014  COLOR STRAW   APPEARANCE CLEAR   SPECIFIC GRAVITY <1.005   pH 6.0   GLUCOSE NEG mg/dL  BILIRUBIN NEG   KETONE NEG mg/dL  BLOOD NEG   PROTEIN NEG mg/dL  UROBILINOGEN 0.2 mg/dL  NITRITE NEG   LEUKOCYTE ESTERASE NEG     I reviewed her medical records and urinalyses.  Her serum creatinine on 12/01/12 was 0.7.   Procedure  Procedure: Cystoscopy  Chaperone Present: Ethel Rana.  Indication: Hematuria.  Informed Consent: Risks, benefits, and potential adverse events were discussed and informed consent was obtained from the patient.  Prep: The patient was prepped with betadine.  Anesthesia:. Local anesthesia was administered intraurethrally with  2% lidocaine jelly.  Antibiotic prophylaxis: Ciprofloxacin.  Procedure Note:  Urethral meatus:. No abnormalities.  Anterior urethra: No abnormalities.  Bladder: Visulization was clear. The ureteral orifices were in the normal anatomic position bilaterally and had clear efflux of urine. A systematic survey of the bladder demonstrated no bladder tumors or stones. The mucosa was smooth without abnormalities. The patient tolerated the procedure well.  Complications: None.    Plan Gross Hematuria (599.71)  1. AU CT-HEMATURIA PROTOCOL  Requested for: 17Jul2014 2. Cysto  Done: 17Jul2014 3. Follow-up Office  Follow-up  Requested for: 17Jul2014 Health Maintenance (V70.0)  4. UA With REFLEX  Done: 17Jul2014 02:04PM  Discussion/Summary  1.  Painless gross hematuria: Her cystoscopic evaluation today was unremarkable.  I did recommend that she proceed with a hematuria protocol CT scan to complete her evaluation.  This will be performed in the near future and I will notify her of the results.  Cc: Dr. Darryll Capers     Verified Results AU CT-HEMATURIA PROTOCOL1 22Jul2014 12:00AM1 Lyda Perone  [Dec 10, 2012 6:08PM Sedona Wenk] I reviewed her CT results with her today. She has a 4 mm right proximal ureteral stone. We discussed treatment options including ureteroscopic laser lithotripsy, SWL, and PCNL. She is preparing to leave the country for a trip in early August and therefore wishes to proceed with right ureteroscopic laser lithotripsy for most definitive therapy. The potential risks, benefits, complications, and alternative options were discussed in detail and informed consent was obtained.   Test Name Result Flag Reference  ** RADIOLOGY REPORT BY [facility] **   *RADIOLOGY REPORT*  Clinical Data: Gross hematuria  CT ABDOMEN AND PELVIS WITHOUT AND WITH CONTRAST  Technique: Multidetector CT imaging of the abdomen and pelvis was performed without contrast material in one or  both body regions, followed by contrast material(s) and further sections in one or both body regions.  Contrast: 125 ml of Isovue  Comparison: None.  Findings: The lung bases appear clear. There is no pleural or pericardial effusion.  No suspicious liver abnormality. There is a cyst identified within the left lobe of liver measuring 1 cm, image 16/series 3. The gallbladder appears normal. Normal appearance of the pancreas. The spleen is unremarkable.  The adrenal glands are normal. There is a small cyst within the knee mid pole of right kidney measuring 6 mm, image 34/series 3. Mild right-sided hydronephrosis and hydroureter is noted. Stones at the right UPJ measure 4 mm. There is a parapelvic cyst and cortical cyst within the upper pole the left kidney. The parapelvic cyst measures 2.2 cm, image 26/series 30. There is a barely perceptible thin internal area of septation associated this cyst. No left-sided nephrolithiasis or hydronephrosis. The urinary bladder appears normal. The patient is status post the uterus is either atrophic or the patient is status post hysterectomy.  Normal caliber of the abdominal aorta. No aneurysm. There is no upper abdominal adenopathy identified. There is no pelvic or inguinal adenopathy.  The stomach appears normal. The small bowel loops have a normal course and caliber without obstruction. Normal appearance of the appendix. The colon is unremarkable.  Review of the visualized osseous structures is unremarkable. There are no aggressive lytic or sclerotic bone lesions.  IMPRESSION:  1.  Right-sided hydronephrosis secondary to 4 mm right UPJ calculus. 2. Bosniak type 2 F cyst within the upper pole the left kidney. Advise follow-up imaging in 12 months.   Original Report Authenticated By: Signa Kell, M.D.     1. Amended By: Heloise Purpura; 12/10/2012 6:08 PMEST  Signatures Electronically signed by : Heloise Purpura, M.D.; Dec 10 2012   6:08PM

## 2012-12-16 ENCOUNTER — Encounter (HOSPITAL_COMMUNITY): Payer: Self-pay | Admitting: Anesthesiology

## 2012-12-16 ENCOUNTER — Encounter (HOSPITAL_COMMUNITY)
Admission: RE | Disposition: A | Discharge status: Home / Self Care | Payer: Self-pay | Source: Ambulatory Visit | Attending: Urology

## 2012-12-16 ENCOUNTER — Ambulatory Visit (HOSPITAL_COMMUNITY)
Admission: RE | Admit: 2012-12-16 | Discharge: 2012-12-16 | Disposition: A | Discharge status: Home / Self Care | Payer: BC Managed Care – PPO | Source: Ambulatory Visit | Attending: Urology | Admitting: Urology

## 2012-12-16 ENCOUNTER — Encounter (HOSPITAL_COMMUNITY): Payer: Self-pay | Admitting: *Deleted

## 2012-12-16 ENCOUNTER — Ambulatory Visit (HOSPITAL_COMMUNITY): Payer: BC Managed Care – PPO | Admitting: Anesthesiology

## 2012-12-16 DIAGNOSIS — Z90710 Acquired absence of both cervix and uterus: Secondary | ICD-10-CM | POA: Insufficient documentation

## 2012-12-16 DIAGNOSIS — E7800 Pure hypercholesterolemia, unspecified: Secondary | ICD-10-CM | POA: Insufficient documentation

## 2012-12-16 DIAGNOSIS — Z79899 Other long term (current) drug therapy: Secondary | ICD-10-CM | POA: Insufficient documentation

## 2012-12-16 DIAGNOSIS — N201 Calculus of ureter: Secondary | ICD-10-CM | POA: Insufficient documentation

## 2012-12-16 HISTORY — PX: CYSTOSCOPY WITH STENT PLACEMENT: SHX5790

## 2012-12-16 HISTORY — PX: CYSTOSCOPY/RETROGRADE/URETEROSCOPY: SHX5316

## 2012-12-16 SURGERY — CYSTOSCOPY/RETROGRADE/URETEROSCOPY
Anesthesia: General | Laterality: Right | Wound class: Clean Contaminated

## 2012-12-16 MED ORDER — LIDOCAINE HCL 1 % IJ SOLN
INTRAMUSCULAR | Status: DC | PRN
  Administered 2012-12-16: 5 mg via INTRADERMAL

## 2012-12-16 MED ORDER — MIDAZOLAM HCL 5 MG/5ML IJ SOLN
INTRAMUSCULAR | Status: DC | PRN
  Administered 2012-12-16: 2 mg via INTRAVENOUS

## 2012-12-16 MED ORDER — DEXAMETHASONE SODIUM PHOSPHATE 10 MG/ML IJ SOLN
INTRAMUSCULAR | Status: DC | PRN
  Administered 2012-12-16: 5 mg via INTRAVENOUS

## 2012-12-16 MED ORDER — OXYCODONE HCL 5 MG/5ML PO SOLN
5.0000 mg | Freq: Once | ORAL | Status: DC | PRN
  Filled 2012-12-16: qty 5

## 2012-12-16 MED ORDER — METOCLOPRAMIDE HCL 5 MG/ML IJ SOLN
INTRAMUSCULAR | Status: DC | PRN
  Administered 2012-12-16: 10 mg via INTRAVENOUS

## 2012-12-16 MED ORDER — OXYCODONE HCL 5 MG PO TABS: 5.0000 mg | ORAL_TABLET | Freq: Once | ORAL | Status: DC | PRN

## 2012-12-16 MED ORDER — LACTATED RINGERS IV SOLN: INTRAVENOUS | Status: DC

## 2012-12-16 MED ORDER — CIPROFLOXACIN IN D5W 400 MG/200ML IV SOLN
400.0000 mg | INTRAVENOUS | Status: AC
  Administered 2012-12-16: 400 mg via INTRAVENOUS

## 2012-12-16 MED ORDER — INDIGOTINDISULFONATE SODIUM 8 MG/ML IJ SOLN
INTRAMUSCULAR | Status: AC
  Filled 2012-12-16: qty 5

## 2012-12-16 MED ORDER — FENTANYL CITRATE 0.05 MG/ML IJ SOLN
INTRAMUSCULAR | Status: DC | PRN
  Administered 2012-12-16: 50 ug via INTRAVENOUS
  Administered 2012-12-16: 100 ug via INTRAVENOUS

## 2012-12-16 MED ORDER — PROPOFOL 10 MG/ML IV BOLUS
INTRAVENOUS | Status: DC | PRN
  Administered 2012-12-16: 180 mg via INTRAVENOUS

## 2012-12-16 MED ORDER — ACETAMINOPHEN 10 MG/ML IV SOLN
1000.0000 mg | Freq: Once | INTRAVENOUS | Status: DC | PRN
  Filled 2012-12-16: qty 100

## 2012-12-16 MED ORDER — PROMETHAZINE HCL 25 MG/ML IJ SOLN: 6.2500 mg | INTRAMUSCULAR | Status: DC | PRN

## 2012-12-16 MED ORDER — HYDROCODONE-ACETAMINOPHEN 5-325 MG PO TABS: 1.0000 | ORAL_TABLET | Freq: Four times a day (QID) | ORAL | Status: DC | PRN

## 2012-12-16 MED ORDER — FENTANYL CITRATE 0.05 MG/ML IJ SOLN: 25.0000 ug | INTRAMUSCULAR | Status: DC | PRN

## 2012-12-16 MED ORDER — HYDROMORPHONE HCL PF 1 MG/ML IJ SOLN: 0.2500 mg | INTRAMUSCULAR | Status: DC | PRN

## 2012-12-16 MED ORDER — IOHEXOL 300 MG/ML  SOLN
INTRAMUSCULAR | Status: AC
  Filled 2012-12-16: qty 1

## 2012-12-16 MED ORDER — MEPERIDINE HCL 50 MG/ML IJ SOLN: 6.2500 mg | INTRAMUSCULAR | Status: DC | PRN

## 2012-12-16 MED ORDER — SODIUM CHLORIDE 0.9 % IR SOLN
Status: DC | PRN
  Administered 2012-12-16: 4000 mL

## 2012-12-16 MED ORDER — CIPROFLOXACIN IN D5W 400 MG/200ML IV SOLN
INTRAVENOUS | Status: AC
  Filled 2012-12-16: qty 200

## 2012-12-16 MED ORDER — LACTATED RINGERS IV SOLN
INTRAVENOUS | Status: DC
  Administered 2012-12-16: 1000 mL via INTRAVENOUS

## 2012-12-16 MED ORDER — ONDANSETRON HCL 4 MG/2ML IJ SOLN
INTRAMUSCULAR | Status: DC | PRN
  Administered 2012-12-16: 4 mg via INTRAVENOUS

## 2012-12-16 MED ORDER — IOHEXOL 300 MG/ML  SOLN
INTRAMUSCULAR | Status: DC | PRN
  Administered 2012-12-16: 10 mL

## 2012-12-16 MED ORDER — CIPROFLOXACIN HCL 500 MG PO TABS: 500.0000 mg | ORAL_TABLET | Freq: Two times a day (BID) | ORAL | Status: DC

## 2012-12-16 SURGICAL SUPPLY — 16 items
BAG URO CATCHER STRL LF (DRAPE) ×2 IMPLANT
BASKET ZERO TIP NITINOL 2.4FR (BASKET) ×1 IMPLANT
BSKT STON RTRVL ZERO TP 2.4FR (BASKET) ×1
CATH INTERMIT  6FR 70CM (CATHETERS) ×2 IMPLANT
CLOTH BEACON ORANGE TIMEOUT ST (SAFETY) ×2 IMPLANT
DRAPE CAMERA CLOSED 9X96 (DRAPES) ×2 IMPLANT
GLOVE BIOGEL M STRL SZ7.5 (GLOVE) ×2 IMPLANT
GOWN PREVENTION PLUS XLARGE (GOWN DISPOSABLE) ×2 IMPLANT
GOWN STRL NON-REIN LRG LVL3 (GOWN DISPOSABLE) ×4 IMPLANT
GUIDEWIRE ANG ZIPWIRE 038X150 (WIRE) IMPLANT
GUIDEWIRE STR DUAL SENSOR (WIRE) ×3 IMPLANT
MANIFOLD NEPTUNE II (INSTRUMENTS) ×2 IMPLANT
PACK CYSTO (CUSTOM PROCEDURE TRAY) ×2 IMPLANT
SHEATH ACCESS URETERAL 38CM (SHEATH) ×1 IMPLANT
STENT CONTOUR 6FRX24X.038 (STENTS) ×1 IMPLANT
TUBING CONNECTING 10 (TUBING) ×2 IMPLANT

## 2012-12-16 NOTE — Anesthesia Postprocedure Evaluation (Signed)
  Anesthesia Post-op Note  Patient: Karen Salinas  Procedure(s) Performed: Procedure(s) (LRB): CYSTOSCOPY/RETROGRADE/URETEROSCOPY (Right) RIGHT URETERAL STENT PLACEMENT (Right)  Patient Location: PACU  Anesthesia Type: General  Level of Consciousness: awake and alert   Airway and Oxygen Therapy: Patient Spontanous Breathing  Post-op Pain: mild  Post-op Assessment: Post-op Vital signs reviewed, Patient's Cardiovascular Status Stable, Respiratory Function Stable, Patent Airway and No signs of Nausea or vomiting  Last Vitals:  Filed Vitals:   12/16/12 1455  BP: 130/77  Pulse: 61  Temp: 36.6 C  Resp: 16    Post-op Vital Signs: stable   Complications: No apparent anesthesia complications

## 2012-12-16 NOTE — Anesthesia Postprocedure Evaluation (Signed)
Anesthesia Post Note  Patient: Karen Salinas  Procedure(s) Performed: Procedure(s) (LRB): CYSTOSCOPY/RETROGRADE/URETEROSCOPY (Right) RIGHT URETERAL STENT PLACEMENT (Right)  Anesthesia type: General  Patient location: PACU  Post pain: Pain level controlled  Post assessment: Post-op Vital signs reviewed  Last Vitals: BP 143/85  Pulse 58  Temp(Src) 36.6 C (Oral)  Resp 18  SpO2 100%  Post vital signs: Reviewed  Level of consciousness: sedated  Complications: No apparent anesthesia complications

## 2012-12-16 NOTE — Op Note (Signed)
Preoperative diagnosis: Right ureteral calculus  Postoperative diagnosis: Right ureteral calculus  Procedure:  1. Cystoscopy 2. Right ureteroscopy and stone removal 3. Right ureteral stent placement (6 x 24) 4. Right retrograde pyelography with interpretation  Surgeon: Moody Bruins. M.D.  Anesthesia: General  Complications: None  Intraoperative findings: Right retrograde pyelography demonstrated a filling defect within the proximal right ureter consistent with the patient's known calculus without other abnormalities noted.  EBL: Minimal  Specimens: 1. Right ureteral calculus  Disposition of specimens: [facility] for stone analysis  Indication: Karen Salinas  is a 57 y.o. patient with a right ureteral stone. After reviewing the management options for treatment, they elected to proceed with the above surgical procedure(s). We have discussed the potential benefits and risks of the procedure, side effects of the proposed treatment, the likelihood of the patient achieving the goals of the procedure, and any potential problems that might occur during the procedure or recuperation. Informed consent has been obtained.  Description of procedure:  The patient was taken to the operating room and general anesthesia was induced.  The patient was placed in the dorsal lithotomy position, prepped and draped in the usual sterile fashion, and preoperative antibiotics were administered. A preoperative time-out was performed.   Cystourethroscopy was performed.  The patient's urethra was examined and was normal. The bladder was then systematically examined in its entirety. There was no evidence for any bladder tumors, stones, or other mucosal pathology.    Attention then turned to the right ureteral orifice and a ureteral catheter was used to attempt to intubate the ureteral orifice. The ureteral orifice was quite small and initial attempts to cannulate the orifice were  unsuccessful. Therefore, a wire was placed and the ureteral catheter was placed over the wire into the distal ureter. Omnipaque contrast was injected through the ureteral catheter and a retrograde pyelogram was performed with findings as dictated above.  A 0.38 sensor guidewire was then advanced up the right ureter into the renal pelvis under fluoroscopic guidance. A second guidewire was used to help open the ureteral orifice and the semirigid ureteroscope was placed into the ureter and advanced all the way up to the ureteropelvic junction.  No stone was identified indicating that the stone had migrated into the renal pelvis. The semirigid ureteroscope was then removed.  A 12/14 Fr ureteral access sheath was then advanced over the guide wire. The digital flexible ureteroscope was then advanced through the access sheath into the ureter next to the guidewire and the calculus was identified and was located in the lower pole of the kidney. The stone was then removed with a zero tip nitinol basket.  Reinspection of the renal pelvis revealed no remaining visible stones or fragments of significant size. No other abnormalities were identified.   The safety wire was then replaced and the access sheath removed.  The guidewire was backloaded through the cystoscope and a ureteral stent was advance over the wire using Seldinger technique.  The stent was positioned appropriately under fluoroscopic and cystoscopic guidance with a string left on the stent. The wire was then removed with an adequate stent curl noted in the renal pelvis as well as in the bladder.  The bladder was then emptied and the procedure ended.  The patient appeared to tolerate the procedure well and without complications.  The patient was able to be awakened and transferred to the recovery unit in satisfactory condition.   Moody Bruins MD

## 2012-12-16 NOTE — Transfer of Care (Signed)
Immediate Anesthesia Transfer of Care Note  Patient: Karen Salinas  Procedure(s) Performed: Procedure(s): CYSTOSCOPY/RETROGRADE/URETEROSCOPY (Right) RIGHT URETERAL STENT PLACEMENT (Right)  Patient Location: PACU  Anesthesia Type:General  Level of Consciousness: alert , oriented and patient cooperative  Airway & Oxygen Therapy: Patient Spontanous Breathing and Patient connected to face mask oxygen  Post-op Assessment: Report given to PACU RN, Post -op Vital signs reviewed and stable and Patient moving all extremities  Post vital signs: Reviewed and stable  Complications: No apparent anesthesia complications

## 2012-12-16 NOTE — Interval H&P Note (Signed)
History and Physical Interval Note:  12/16/2012 12:56 PM  Karen Salinas  has presented today for surgery, with the diagnosis of Right Ureteral Calculus  The various methods of treatment have been discussed with the patient and family. After consideration of risks, benefits and other options for treatment, the patient has consented to  Procedure(s): CYSTOSCOPY/RETROGRADE/URETEROSCOPY (Right) HOLMIUM LASER APPLICATION (Right) POSSIBLE RIGHT URETERAL STENT PLACEMENT (Right) as a surgical intervention .  The patient's history has been reviewed, patient examined, no change in status, stable for surgery.  I have reviewed the patient's chart and labs.  Questions were answered to the patient's satisfaction.     Divante Kotch,LES

## 2012-12-16 NOTE — Anesthesia Preprocedure Evaluation (Addendum)
Anesthesia Evaluation  Patient identified by MRN, date of birth, ID band Patient awake    Reviewed: Allergy & Precautions, H&P , NPO status , Patient's Chart, lab work & pertinent test results  Airway Mallampati: II TM Distance: >3 FB Neck ROM: Full    Dental no notable dental hx. (+) Dental Advisory Given   Pulmonary neg pulmonary ROS,  breath sounds clear to auscultation  Pulmonary exam normal       Cardiovascular - Peripheral Vascular Disease negative cardio ROS  Rhythm:Regular Rate:Normal     Neuro/Psych negative neurological ROS  negative psych ROS   GI/Hepatic negative GI ROS, Neg liver ROS,   Endo/Other  negative endocrine ROS  Renal/GU Renal diseasestones     Musculoskeletal negative musculoskeletal ROS (+)   Abdominal   Peds  Hematology negative hematology ROS (+)   Anesthesia Other Findings   Reproductive/Obstetrics negative OB ROS                          Anesthesia Physical Anesthesia Plan  ASA: I  Anesthesia Plan: General   Post-op Pain Management:    Induction: Intravenous  Airway Management Planned: LMA  Additional Equipment:   Intra-op Plan:   Post-operative Plan: Extubation in OR  Informed Consent: I have reviewed the patients History and Physical, chart, labs and discussed the procedure including the risks, benefits and alternatives for the proposed anesthesia with the patient or authorized representative who has indicated his/her understanding and acceptance.   Dental advisory given  Plan Discussed with: CRNA  Anesthesia Plan Comments:        Anesthesia Quick Evaluation

## 2012-12-17 ENCOUNTER — Encounter (HOSPITAL_COMMUNITY): Payer: Self-pay | Admitting: Urology

## 2012-12-20 ENCOUNTER — Ambulatory Visit (INDEPENDENT_AMBULATORY_CARE_PROVIDER_SITE_OTHER): Payer: BC Managed Care – PPO | Admitting: Family Medicine

## 2012-12-20 ENCOUNTER — Telehealth (INDEPENDENT_AMBULATORY_CARE_PROVIDER_SITE_OTHER): Payer: Self-pay

## 2012-12-20 ENCOUNTER — Encounter: Payer: Self-pay | Admitting: Family Medicine

## 2012-12-20 VITALS — BP 156/94 | HR 106 | Temp 98.2°F | Wt 130.0 lb

## 2012-12-20 DIAGNOSIS — K645 Perianal venous thrombosis: Secondary | ICD-10-CM

## 2012-12-20 NOTE — Progress Notes (Signed)
  Subjective:    Patient ID: Karen Salinas, female    DOB: 1955/07/11, 57 y.o.   MRN: [id number]  HPI Here for 2 days of tenderness around the anus which she thinks is a hemorrhoid. She was mildly constipated early this week but this has improved after using some Miralax. There is no bleeding.    Review of Systems  Constitutional: Negative.   Gastrointestinal: Positive for abdominal pain and rectal pain. Negative for anal bleeding.       Objective:   Physical Exam  Constitutional: She appears well-developed and well-nourished.  Genitourinary:  Small thrombosed external hemorrhoid           Assessment & Plan:  This was lanced and the thrombus was removed. She will use hot Sitz baths and Preparation H prn. Stay on Miralax.

## 2012-12-20 NOTE — Telephone Encounter (Signed)
Pt called going out of town and requested options for possible thrombosed hem. She called Dr Crecencio Mc since he did procedure on her 2 days ago, asking what she should do and he told pt to call Dr Kinnie Scales. Pt states she called there office and was told to call our office for recommendations. Pt concerned about having any procedure at this time since she is flying out of town and will be gone for 2 weeks. Pt advised with out seeing her it is difficult to make recomendations since we do not have a true dx. Pt advised of usual care of thrombosed hems. Pt wanted to think about whether or not she wants thrombosed hem lanced or not since she is going out of town. Pt states she may go to ER.  Pt will call back if she wants f/u with our office.

## 2013-02-14 ENCOUNTER — Other Ambulatory Visit (INDEPENDENT_AMBULATORY_CARE_PROVIDER_SITE_OTHER): Payer: BC Managed Care – PPO

## 2013-02-14 DIAGNOSIS — E785 Hyperlipidemia, unspecified: Secondary | ICD-10-CM

## 2013-02-14 LAB — LIPID PANEL
Cholesterol: 227 mg/dL — ABNORMAL HIGH (ref 0–200)
HDL: 51.7 mg/dL (ref >39.00)
Total CHOL/HDL Ratio: 4
Triglycerides: 112 mg/dL (ref 0.0–149.0)
VLDL: 22.4 mg/dL (ref 0.0–40.0)

## 2013-02-14 LAB — LDL CHOLESTEROL, DIRECT: Direct LDL: 152.7 mg/dL

## 2013-02-14 LAB — HEPATIC FUNCTION PANEL: Albumin: 4.3 g/dL (ref 3.5–5.2)

## 2013-02-24 ENCOUNTER — Other Ambulatory Visit: Payer: BC Managed Care – PPO

## 2013-03-03 ENCOUNTER — Ambulatory Visit (INDEPENDENT_AMBULATORY_CARE_PROVIDER_SITE_OTHER): Payer: BC Managed Care – PPO | Admitting: Internal Medicine

## 2013-03-03 ENCOUNTER — Encounter: Payer: Self-pay | Admitting: Internal Medicine

## 2013-03-03 VITALS — BP 136/80 | HR 72 | Temp 98.2°F | Resp 16 | Ht 62.0 in | Wt 132.0 lb

## 2013-03-03 DIAGNOSIS — E785 Hyperlipidemia, unspecified: Secondary | ICD-10-CM

## 2013-03-03 MED ORDER — FENOFIBRATE 145 MG PO TABS: 145.0000 mg | ORAL_TABLET | Freq: Every day | ORAL | Status: DC

## 2013-03-03 NOTE — Progress Notes (Signed)
  Subjective:    Patient ID: Karen Salinas, female    DOB: 06-Dec-1955, 57 y.o.   MRN: [id number]  HPI Follow up for lipid tricor trial   Review of Systems  Constitutional: Negative for activity change, appetite change and fatigue.  HENT: Negative for congestion, ear pain, postnasal drip and sinus pressure.   Eyes: Negative for redness and visual disturbance.  Respiratory: Negative for cough, shortness of breath and wheezing.   Gastrointestinal: Negative for abdominal pain and abdominal distention.  Genitourinary: Negative for dysuria, frequency and menstrual problem.  Musculoskeletal: Negative for arthralgias, joint swelling, myalgias and neck pain.  Skin: Negative for rash and wound.  Neurological: Negative for dizziness, weakness and headaches.  Hematological: Negative for adenopathy. Does not bruise/bleed easily.  Psychiatric/Behavioral: Negative for sleep disturbance and decreased concentration.       Objective:   Physical Exam  Constitutional: She is oriented to person, place, and time. She appears well-developed and well-nourished. No distress.  HENT:  Head: Normocephalic and atraumatic.  Right Ear: External ear normal.  Left Ear: External ear normal.  Nose: Nose normal.  Mouth/Throat: Oropharynx is clear and moist.  Eyes: Conjunctivae and EOM are normal. Pupils are equal, round, and reactive to light.  Neck: Normal range of motion. Neck supple. No JVD present. No tracheal deviation present. No thyromegaly present.  Cardiovascular: Normal rate, regular rhythm, normal heart sounds and intact distal pulses.   No murmur heard. Pulmonary/Chest: Effort normal and breath sounds normal. She has no wheezes. She exhibits no tenderness.  Abdominal: Soft. Bowel sounds are normal.  Musculoskeletal: Normal range of motion. She exhibits no edema and no tenderness.  Lymphadenopathy:    She has no cervical adenopathy.  Neurological: She is alert and oriented to person, place, and time.  She has normal reflexes. No cranial nerve deficit.  Skin: Skin is warm and dry. She is not diaphoretic.  Psychiatric: She has a normal mood and affect. Her behavior is normal.          Assessment & Plan:  tricor trial for 2 months Lipids then refill

## 2013-03-27 ENCOUNTER — Other Ambulatory Visit: Payer: Self-pay

## 2013-05-06 ENCOUNTER — Other Ambulatory Visit (INDEPENDENT_AMBULATORY_CARE_PROVIDER_SITE_OTHER): Payer: BC Managed Care – PPO

## 2013-05-06 DIAGNOSIS — E785 Hyperlipidemia, unspecified: Secondary | ICD-10-CM

## 2013-05-06 LAB — HEPATIC FUNCTION PANEL
Alkaline Phosphatase: 57 U/L (ref 39–117)
Bilirubin, Direct: 0.1 mg/dL (ref 0.0–0.3)

## 2013-05-06 LAB — LIPID PANEL
HDL: 53.4 mg/dL (ref >39.00)
Total CHOL/HDL Ratio: 4
Triglycerides: 63 mg/dL (ref 0.0–149.0)
VLDL: 12.6 mg/dL (ref 0.0–40.0)

## 2013-05-12 ENCOUNTER — Other Ambulatory Visit: Payer: Self-pay | Admitting: *Deleted

## 2013-05-12 ENCOUNTER — Encounter: Payer: Self-pay | Admitting: Internal Medicine

## 2013-05-12 DIAGNOSIS — R945 Abnormal results of liver function studies: Secondary | ICD-10-CM

## 2013-05-12 DIAGNOSIS — R7989 Other specified abnormal findings of blood chemistry: Secondary | ICD-10-CM

## 2013-05-26 ENCOUNTER — Ambulatory Visit: Payer: BC Managed Care – PPO | Admitting: Sports Medicine

## 2013-06-12 ENCOUNTER — Other Ambulatory Visit (INDEPENDENT_AMBULATORY_CARE_PROVIDER_SITE_OTHER): Payer: BC Managed Care – PPO

## 2013-06-12 DIAGNOSIS — E785 Hyperlipidemia, unspecified: Secondary | ICD-10-CM

## 2013-06-12 DIAGNOSIS — R945 Abnormal results of liver function studies: Secondary | ICD-10-CM

## 2013-06-12 DIAGNOSIS — R7989 Other specified abnormal findings of blood chemistry: Secondary | ICD-10-CM

## 2013-06-12 LAB — LDL CHOLESTEROL, DIRECT: LDL DIRECT: 155.9 mg/dL

## 2013-06-12 LAB — HEPATIC FUNCTION PANEL
ALK PHOS: 62 U/L (ref 39–117)
ALT: 32 U/L (ref 0–35)
AST: 22 U/L (ref 0–37)
Albumin: 4.2 g/dL (ref 3.5–5.2)
BILIRUBIN DIRECT: 0.1 mg/dL (ref 0.0–0.3)
TOTAL PROTEIN: 7.1 g/dL (ref 6.0–8.3)
Total Bilirubin: 0.9 mg/dL (ref 0.3–1.2)

## 2013-06-12 LAB — LIPID PANEL
Cholesterol: 223 mg/dL — ABNORMAL HIGH (ref 0–200)
HDL: 53.3 mg/dL (ref >39.00)
Total CHOL/HDL Ratio: 4
Triglycerides: 150 mg/dL — ABNORMAL HIGH (ref 0.0–149.0)
VLDL: 30 mg/dL (ref 0.0–40.0)

## 2013-06-12 LAB — SEDIMENTATION RATE: Sed Rate: 12 mm/hr (ref 0–22)

## 2013-06-14 LAB — ALPHA-1-ANTITRYPSIN: A-1 Antitrypsin, Ser: 105 mg/dL (ref 90–200)

## 2013-06-18 ENCOUNTER — Encounter: Payer: Self-pay | Admitting: Internal Medicine

## 2013-06-18 DIAGNOSIS — E781 Pure hyperglyceridemia: Secondary | ICD-10-CM

## 2013-06-19 ENCOUNTER — Ambulatory Visit: Payer: BC Managed Care – PPO | Admitting: Sports Medicine

## 2013-06-20 ENCOUNTER — Encounter: Payer: Self-pay | Admitting: *Deleted

## 2013-06-20 ENCOUNTER — Encounter: Payer: BC Managed Care – PPO | Attending: Internal Medicine | Admitting: *Deleted

## 2013-06-20 DIAGNOSIS — E785 Hyperlipidemia, unspecified: Secondary | ICD-10-CM

## 2013-06-20 DIAGNOSIS — Z713 Dietary counseling and surveillance: Secondary | ICD-10-CM | POA: Insufficient documentation

## 2013-06-20 MED ORDER — EZETIMIBE 10 MG PO TABS: 10.0000 mg | ORAL_TABLET | Freq: Every day | ORAL | Status: DC

## 2013-06-20 NOTE — Addendum Note (Signed)
Addended by: Ricard Dillon on: 06/20/2013 01:00 PM   Modules accepted: Orders

## 2013-06-20 NOTE — Progress Notes (Signed)
  Medical Nutrition Therapy:  Appt start time: 0800 end time:  0900.  Assessment:  Primary concerns today: Karen Salinas was referred for nutrition counseling pertaining to hyperlipidemia.  She states that she's been struggling managing her lipids for a few years, but tried diet and exercise management.  They started medication and her lipids improved/  She discontinued the medication due to an increase in liver enzymes and she is interested in dietary management.  Her triglycerides have always been normal except right after Christmas when she had been more festive and inactive than normal.   Karen Salinas lives by herself in [place] and is dating a man [name] who doesn't eat as healthy and when she visit him she eats worse.  She goes up there once a month 5-10 days. When she is home she goes out for lunch.  Sometimes she eats while distracted (tv, reading, etc). She doesn't work so she spends time with friends, reading, doing data entry.  Preferred Learning Style:   Auditory  Learning Readiness:   Change in progress   MEDICATIONS: see list   DIETARY INTAKE:  Usual eating pattern includes 2 meals and 1-2 snacks per day.  Everyday foods include salads, proteins, popcorn.  Avoided foods include fried foods.    24-hr recall:  B ( AM): coffee with truvia and sugar-free creamer.  Doesn't eat anything usually.  Might get an avacado or protein shake  Snk ( AM): none  L ( PM): pita delight, panera salad or babaritos salad Snk ( PM): 10 peanut M&M D ( PM): brussel sprouts with chicken and  Snk ( PM): skinny pop popcorn.  Slice light whole grain toast with PB on it; usually rice krispies or special K with skim milk.  Sometimes Karen Salinas greek yogurt or salad from [employer] Beverages:coffee and some water.  Only drinks with her fiance   In [place] Eats meat, starch, vegetable for dinner.  Bigger lunches and they go out more and she drinks a little more there  Usual physical activity: walks 3-4 miles  5 days a week.  pilates 1-2 times a week  Estimated energy needs: 1600 calories 180 g carbohydrates 120 g protein 44 g fat   Nutritional Diagnosis:  NI-5.8.5 Inadeqate fiber intake As related to limited whole grains and fruit consumption.  As evidenced by elevated cholesterol and triglycerides.    Intervention:  Nutrition counseling provided.  Discussed saturated, unsaturated, and trans fats.  Encouraged increasing unsaturated fats and avoiding the others.  Recommended increasing fiber from whole grains and fruits.  Recommended omega-3 fatty acids.  Discussed reading food labels to identify all these nutrients.  Suggested weight training for weight goals.   Teaching Method Utilized: Visual Auditory  Handouts given during visit include:  Nutrition Care Manual TLC recommendations  Reading food labels  Barriers to learning/adherence to lifestyle change: none  Demonstrated degree of understanding via:  Teach Back   Monitoring/Evaluation:  Dietary intake, exercise, lab data, and body weight prn.

## 2013-06-23 ENCOUNTER — Encounter: Payer: Self-pay | Admitting: Sports Medicine

## 2013-06-23 ENCOUNTER — Ambulatory Visit (INDEPENDENT_AMBULATORY_CARE_PROVIDER_SITE_OTHER): Payer: BC Managed Care – PPO | Admitting: Sports Medicine

## 2013-06-23 VITALS — BP 136/87 | Ht 62.0 in | Wt 130.0 lb

## 2013-06-23 DIAGNOSIS — M25562 Pain in left knee: Secondary | ICD-10-CM

## 2013-06-23 DIAGNOSIS — M25569 Pain in unspecified knee: Secondary | ICD-10-CM

## 2013-06-23 NOTE — Progress Notes (Signed)
   Subjective:    Patient ID: Karen Salinas, female    DOB: [birth year], 58 y.o.   MRN: [id number]  HPI chief complaint: Left knee pain  Very pleasant 58 year old female comes in today complaining of 6 weeks of medial sided left knee pain. No injury, but a sudden onset of pain that began after she started doing some jogging. She is an avid walker but decided to try a little jogging. When her knee started to hurt she stopped jogging and resumed walking. As a result, her symptoms have improved. Her pain was initially severe but it has improved dramatically with over-the-counter Advil and time. No mechanical symptoms. No swelling. No problems with his knee in the past. No prior knee surgeries. No numbness or tingling.    Review of Systems     Objective:   Physical Exam Developed, well-nourished. No acute distress. Vital signs are reviewed  Left knee: Full range of motion. No effusion. Trace patellofemoral crepitus. There is tenderness to palpation along the medial joint line with pain but no popping with McMurray's. Knee is stable to ligamentous exam. Neurovascularly intact distally.      Assessment & Plan:  Left knee pain likely secondary to degenerative meniscal tear  Patient's symptoms are already improving. I recommended a body helix compression sleeve with activity. Continue with when necessary Advil. I've given her a comprehensive home exercise program consisting of both hip and knee strengthening. She is also very active in Pilates and can continue with this. Followup with me in 3 weeks if symptoms persist. Consider imaging if that is the case.

## 2013-07-21 ENCOUNTER — Ambulatory Visit: Payer: BC Managed Care – PPO | Admitting: Sports Medicine

## 2013-08-11 ENCOUNTER — Other Ambulatory Visit: Payer: Self-pay

## 2013-08-11 DIAGNOSIS — Z1231 Encounter for screening mammogram for malignant neoplasm of breast: Secondary | ICD-10-CM

## 2013-08-13 ENCOUNTER — Ambulatory Visit: Payer: BC Managed Care – PPO | Admitting: Family

## 2013-08-15 ENCOUNTER — Encounter: Payer: Self-pay | Admitting: Family Medicine

## 2013-08-15 ENCOUNTER — Ambulatory Visit (INDEPENDENT_AMBULATORY_CARE_PROVIDER_SITE_OTHER): Payer: BC Managed Care – PPO | Admitting: Family Medicine

## 2013-08-15 ENCOUNTER — Telehealth: Payer: Self-pay | Admitting: Internal Medicine

## 2013-08-15 VITALS — BP 138/80 | HR 88 | Wt 136.0 lb

## 2013-08-15 DIAGNOSIS — R1032 Left lower quadrant pain: Secondary | ICD-10-CM

## 2013-08-15 DIAGNOSIS — E785 Hyperlipidemia, unspecified: Secondary | ICD-10-CM

## 2013-08-15 DIAGNOSIS — R1031 Right lower quadrant pain: Secondary | ICD-10-CM

## 2013-08-15 NOTE — Progress Notes (Signed)
Subjective:    Patient ID: Karen Salinas, female    DOB: [birth year], 58 y.o.   MRN: [id number]  Gastrophageal Reflux She complains of abdominal pain. She reports no chest pain or no coughing.   Patient seen for the following issues Last week she was traveling with work and had some dyspepsia. She was describing some vague discomfort in her lower abdomen bilaterally she has some mild nausea without vomiting. She initially thought this was GERD related but did not have any substernal or upper abdominal symptoms. She did recall having several days without a bowel movement and after having bowel movement following MiraLax her symptoms are improved at this time. She did not have any upper quadrant pain. She's had previous colonoscopy age 64. No recent stool changes. She tends to have loose stools frequently with travel which she attributes to anxiety. No appetite or weight changes. She is currently taking Zetia for dyslipidemia and she thinks  this has caused more GI symptoms. She stopped this on her own couple days ago and her symptoms are better now.  Other issue is dyslipidemia. She's had elevated liver transaminases previously with statins. Recently on Zetia as above. Nonsmoker. No family history of premature CAD. No diabetes history. No hypertension history.  Past Medical History  Diagnosis Date  . IBS (irritable bowel syndrome)   . Low back pain   . Sinusitis   . Kidney stones   . Raynaud's disease   . Hyperlipidemia    Past Surgical History  Procedure Laterality Date  . Abdominal hysterectomy    . Colonoscopy  2002  . Tubal ligation    . Eye surgery for vision    . Dilation and curettage of uterus    . Cystoscopy/retrograde/ureteroscopy Right 12/16/2012    Procedure: CYSTOSCOPY/RETROGRADE/URETEROSCOPY;  Surgeon: Dutch Gray, MD;  Location: [facility];  Service: Urology;  Laterality: Right;  . Cystoscopy with stent placement Right 12/16/2012    Procedure: RIGHT URETERAL STENT PLACEMENT;   Surgeon: Dutch Gray, MD;  Location: [facility];  Service: Urology;  Laterality: Right;    reports that she has never smoked. She has never used smokeless tobacco. She reports that she drinks about 1.8 ounces of alcohol per week. She reports that she does not use illicit drugs. family history includes Hyperlipidemia in her father; Hypertension in her father and mother; Stroke in her brother. Allergies  Allergen Reactions  . Shellfish Allergy Nausea And Vomiting     Review of Systems  Constitutional: Negative for fever, chills, appetite change and unexpected weight change.  Respiratory: Negative for cough and shortness of breath.   Cardiovascular: Negative for chest pain.  Gastrointestinal: Positive for abdominal pain and constipation. Negative for vomiting and blood in stool.       Objective:   Physical Exam  Constitutional: She appears well-developed and well-nourished. No distress.  Neck: Neck supple.  Cardiovascular: Normal rate and regular rhythm.   Pulmonary/Chest: Effort normal and breath sounds normal. No respiratory distress. She has no wheezes. She has no rales.  Abdominal: Soft. Bowel sounds are normal. She exhibits no distension and no mass. There is no tenderness. There is no rebound and no guarding.  Musculoskeletal: She exhibits no edema.          Assessment & Plan:  #1 recent intermittent lower abdominal discomfort. Resolved at this time. No evidence to suggest GERD. Suspect this may the related to constipation as her symptoms improved following normal bowel movement. She questions whether this may be related  to Zetia #2 dyslipidemia. Calculated 10 year risk of MI is 2%. Patient has decided to stop Zetia at this time

## 2013-08-15 NOTE — Telephone Encounter (Signed)
Pt would like to switch to dr fry due to dr Arnoldo Morale limited schedule

## 2013-08-15 NOTE — Progress Notes (Signed)
Pre visit review using our clinic review tool, if applicable. No additional management support is needed unless otherwise documented below in the visit note. 

## 2013-08-15 NOTE — Telephone Encounter (Signed)
Ok per Dr Arnoldo Morale

## 2013-08-15 NOTE — Telephone Encounter (Signed)
Sorry but I cannot see her, my panel is too full

## 2013-08-18 NOTE — Telephone Encounter (Signed)
I will see

## 2013-08-18 NOTE — Telephone Encounter (Signed)
Pt is aware.  

## 2013-08-18 NOTE — Telephone Encounter (Signed)
Pt is aware of dr fry decision. Pt would like to know if dr Elease Hashimoto will see her

## 2013-09-09 ENCOUNTER — Ambulatory Visit
Admission: RE | Admit: 2013-09-09 | Discharge: 2013-09-09 | Disposition: A | Discharge status: Home / Self Care | Payer: BC Managed Care – PPO | Source: Ambulatory Visit

## 2013-09-09 DIAGNOSIS — Z1231 Encounter for screening mammogram for malignant neoplasm of breast: Secondary | ICD-10-CM

## 2013-09-22 ENCOUNTER — Other Ambulatory Visit: Payer: Self-pay | Admitting: Dermatology

## 2013-09-23 ENCOUNTER — Other Ambulatory Visit (INDEPENDENT_AMBULATORY_CARE_PROVIDER_SITE_OTHER): Payer: BC Managed Care – PPO

## 2013-09-23 DIAGNOSIS — Z0000 Encounter for general adult medical examination without abnormal findings: Secondary | ICD-10-CM

## 2013-09-23 LAB — POCT URINALYSIS DIPSTICK
Bilirubin, UA: NEGATIVE
Glucose, UA: NEGATIVE
Ketones, UA: NEGATIVE
Leukocytes, UA: NEGATIVE
NITRITE UA: NEGATIVE
PH UA: 5.5
PROTEIN UA: NEGATIVE
RBC UA: NEGATIVE
Spec Grav, UA: 1.025
UROBILINOGEN UA: 0.2

## 2013-09-23 LAB — CBC WITH DIFFERENTIAL/PLATELET
BASOS ABS: 0 10*3/uL (ref 0.0–0.1)
Basophils Relative: 0.5 % (ref 0.0–3.0)
EOS PCT: 3.3 % (ref 0.0–5.0)
Eosinophils Absolute: 0.2 10*3/uL (ref 0.0–0.7)
HCT: 45.7 % (ref 36.0–46.0)
Hemoglobin: 15.7 g/dL — ABNORMAL HIGH (ref 12.0–15.0)
Lymphocytes Relative: 48.2 % — ABNORMAL HIGH (ref 12.0–46.0)
Lymphs Abs: 2.5 10*3/uL (ref 0.7–4.0)
MCHC: 34.3 g/dL (ref 30.0–36.0)
MCV: 94 fl (ref 78.0–100.0)
MONOS PCT: 6.9 % (ref 3.0–12.0)
Monocytes Absolute: 0.4 10*3/uL (ref 0.1–1.0)
NEUTROS ABS: 2.1 10*3/uL (ref 1.4–7.7)
Neutrophils Relative %: 41.1 % — ABNORMAL LOW (ref 43.0–77.0)
Platelets: 247 10*3/uL (ref 150.0–400.0)
RBC: 4.86 Mil/uL (ref 3.87–5.11)
RDW: 12.3 % (ref 11.5–14.6)
WBC: 5.2 10*3/uL (ref 4.5–10.5)

## 2013-09-23 LAB — LIPID PANEL
CHOL/HDL RATIO: 4
Cholesterol: 209 mg/dL — ABNORMAL HIGH (ref 0–200)
HDL: 48.5 mg/dL (ref >39.00)
LDL Cholesterol: 139 mg/dL — ABNORMAL HIGH (ref 0–99)
Triglycerides: 108 mg/dL (ref 0.0–149.0)
VLDL: 21.6 mg/dL (ref 0.0–40.0)

## 2013-09-23 LAB — BASIC METABOLIC PANEL
BUN: 15 mg/dL (ref 6–23)
CHLORIDE: 107 meq/L (ref 96–112)
CO2: 27 meq/L (ref 19–32)
CREATININE: 1 mg/dL (ref 0.4–1.2)
Calcium: 9.7 mg/dL (ref 8.4–10.5)
GFR: 59.9 mL/min — ABNORMAL LOW (ref >60.00)
GLUCOSE: 94 mg/dL (ref 70–99)
Potassium: 4.4 mEq/L (ref 3.5–5.1)
Sodium: 142 mEq/L (ref 135–145)

## 2013-09-23 LAB — HEPATIC FUNCTION PANEL
ALBUMIN: 4.2 g/dL (ref 3.5–5.2)
ALT: 19 U/L (ref 0–35)
AST: 14 U/L (ref 0–37)
Alkaline Phosphatase: 62 U/L (ref 39–117)
Bilirubin, Direct: 0 mg/dL (ref 0.0–0.3)
Total Bilirubin: 0.9 mg/dL (ref 0.2–1.2)
Total Protein: 6.9 g/dL (ref 6.0–8.3)

## 2013-09-23 LAB — TSH: TSH: 3.54 u[IU]/mL (ref 0.35–4.50)

## 2013-09-24 ENCOUNTER — Other Ambulatory Visit: Payer: BC Managed Care – PPO

## 2013-10-01 ENCOUNTER — Other Ambulatory Visit: Payer: BC Managed Care – PPO

## 2013-10-08 ENCOUNTER — Encounter: Payer: BC Managed Care – PPO | Admitting: Internal Medicine

## 2013-10-17 ENCOUNTER — Encounter: Payer: Self-pay | Admitting: Family Medicine

## 2013-10-17 ENCOUNTER — Ambulatory Visit (INDEPENDENT_AMBULATORY_CARE_PROVIDER_SITE_OTHER): Payer: BC Managed Care – PPO | Admitting: Family Medicine

## 2013-10-17 VITALS — BP 136/80 | HR 79 | Temp 97.9°F | Ht 62.0 in | Wt 135.0 lb

## 2013-10-17 DIAGNOSIS — Z0000 Encounter for general adult medical examination without abnormal findings: Secondary | ICD-10-CM

## 2013-10-17 DIAGNOSIS — E785 Hyperlipidemia, unspecified: Secondary | ICD-10-CM

## 2013-10-17 NOTE — Progress Notes (Signed)
Subjective:    Patient ID: Karen Salinas, female    DOB: 30-Mar-1956, 58 y.o.   MRN: [id number]  HPI Patient seen for complete physical. She sees gynecologist regularly. She's had previous hysterectomy. She has mild hyperlipidemia and is currently not taking medications. Low risk for CAD. Recent calculated risk of 10 years of 2%. She's had previous issues with elevated liver transaminases with statin medications. Nonsmoker. Exercise with walking regularly. No consistent calcium or vitamin D intake. Colonoscopy up to date. Tetanus up-to-date.  Past Medical History  Diagnosis Date  . IBS (irritable bowel syndrome)   . Low back pain   . Sinusitis   . Kidney stones   . Raynaud's disease   . Hyperlipidemia    Past Surgical History  Procedure Laterality Date  . Abdominal hysterectomy    . Colonoscopy  2002  . Tubal ligation    . Eye surgery for vision    . Dilation and curettage of uterus    . Cystoscopy/retrograde/ureteroscopy Right 12/16/2012    Procedure: CYSTOSCOPY/RETROGRADE/URETEROSCOPY;  Surgeon: Dutch Gray, MD;  Location: [facility];  Service: Urology;  Laterality: Right;  . Cystoscopy with stent placement Right 12/16/2012    Procedure: RIGHT URETERAL STENT PLACEMENT;  Surgeon: Dutch Gray, MD;  Location: [facility];  Service: Urology;  Laterality: Right;    reports that she has never smoked. She has never used smokeless tobacco. She reports that she drinks about 1.8 ounces of alcohol per week. She reports that she does not use illicit drugs. family history includes Hyperlipidemia in her father; Hypertension in her father and mother; Stroke in her brother. Allergies  Allergen Reactions  . Shellfish Allergy Nausea And Vomiting      Review of Systems  Constitutional: Negative for fever, activity change, appetite change, fatigue and unexpected weight change.  HENT: Negative for ear pain, hearing loss, sore throat and trouble swallowing.   Eyes: Negative for visual disturbance.    Respiratory: Negative for cough and shortness of breath.   Cardiovascular: Negative for chest pain and palpitations.  Gastrointestinal: Negative for abdominal pain, diarrhea, constipation and blood in stool.  Endocrine: Negative for polydipsia and polyuria.  Genitourinary: Negative for dysuria and hematuria.  Musculoskeletal: Negative for arthralgias, back pain and myalgias.  Skin: Negative for rash.  Neurological: Negative for dizziness, syncope and headaches.  Hematological: Negative for adenopathy.  Psychiatric/Behavioral: Negative for confusion and dysphoric mood.       Objective:   Physical Exam  Constitutional: She is oriented to person, place, and time. She appears well-developed and well-nourished.  HENT:  Head: Normocephalic and atraumatic.  Eyes: EOM are normal. Pupils are equal, round, and reactive to light.  Neck: Normal range of motion. Neck supple. No thyromegaly present.  Cardiovascular: Normal rate, regular rhythm and normal heart sounds.   No murmur heard. Pulmonary/Chest: Breath sounds normal. No respiratory distress. She has no wheezes. She has no rales.  Abdominal: Soft. Bowel sounds are normal. She exhibits no distension and no mass. There is no tenderness. There is no rebound and no guarding.  Genitourinary:  Per GYN  Musculoskeletal: Normal range of motion. She exhibits no edema.  Lymphadenopathy:    She has no cervical adenopathy.  Neurological: She is alert and oriented to person, place, and time. She displays normal reflexes. No cranial nerve deficit.  Skin: No rash noted.  Psychiatric: She has a normal mood and affect. Her behavior is normal. Judgment and thought content normal.  Assessment & Plan:  Complete physical. Labs reviewed with patient with no major concerns. Recommend regular calcium 1200 mg daily vitamin D 1000 international units daily. Continue weightbearing exercise. Patient request repeat lipids in 6 months

## 2013-10-17 NOTE — Progress Notes (Signed)
Pre visit review using our clinic review tool, if applicable. No additional management support is needed unless otherwise documented below in the visit note. 

## 2013-10-17 NOTE — Patient Instructions (Signed)
Calcium 1,200 mg daily and Vit d 1,000 IU daily Continue with regular weight bearing exercise.

## 2013-10-20 ENCOUNTER — Encounter: Payer: BC Managed Care – PPO | Admitting: Internal Medicine

## 2013-10-23 ENCOUNTER — Ambulatory Visit (INDEPENDENT_AMBULATORY_CARE_PROVIDER_SITE_OTHER): Payer: BC Managed Care – PPO | Admitting: Family Medicine

## 2013-10-23 ENCOUNTER — Encounter: Payer: Self-pay | Admitting: Family Medicine

## 2013-10-23 VITALS — BP 130/84 | HR 81 | Temp 97.9°F | Wt 133.0 lb

## 2013-10-23 DIAGNOSIS — R51 Headache: Secondary | ICD-10-CM

## 2013-10-23 DIAGNOSIS — R509 Fever, unspecified: Secondary | ICD-10-CM

## 2013-10-23 DIAGNOSIS — M2550 Pain in unspecified joint: Secondary | ICD-10-CM

## 2013-10-23 MED ORDER — DOXYCYCLINE HYCLATE 100 MG PO CAPS: 100.0000 mg | ORAL_CAPSULE | Freq: Two times a day (BID) | ORAL | Status: DC

## 2013-10-23 NOTE — Progress Notes (Signed)
   Subjective:    Patient ID: Karen Salinas, female    DOB: 16-Apr-1956, 58 y.o.   MRN: [id number]  HPI Possible tick exposure. Patient was recently up in [name] about 4 weeks ago. She pulled off and engorged tick from her dog and also noted and engorged tick lying on the floor and was not sure if this had been then into her or her dog. Couple days ago she developed some nonspecific headache and possible low-grade fever and chills. She's also [place] joint pain or her niece. She has not noted any skin rash. No fever today. Mild malaise.  Patient states that she had [facility] spotted fever several years ago which was treated. She has not had any recent petechial rash.  Past Medical History  Diagnosis Date  . IBS (irritable bowel syndrome)   . Low back pain   . Sinusitis   . Kidney stones   . Raynaud's disease   . Hyperlipidemia    Past Surgical History  Procedure Laterality Date  . Abdominal hysterectomy    . Colonoscopy  2002  . Tubal ligation    . Eye surgery for vision    . Dilation and curettage of uterus    . Cystoscopy/retrograde/ureteroscopy Right 12/16/2012    Procedure: CYSTOSCOPY/RETROGRADE/URETEROSCOPY;  Surgeon: Dutch Gray, MD;  Location: [facility];  Service: Urology;  Laterality: Right;  . Cystoscopy with stent placement Right 12/16/2012    Procedure: RIGHT URETERAL STENT PLACEMENT;  Surgeon: Dutch Gray, MD;  Location: [facility];  Service: Urology;  Laterality: Right;    reports that she has never smoked. She has never used smokeless tobacco. She reports that she drinks about 1.8 ounces of alcohol per week. She reports that she does not use illicit drugs. family history includes Hyperlipidemia in her father; Hypertension in her father and mother; Stroke in her brother. Allergies  Allergen Reactions  . Shellfish Allergy Nausea And Vomiting      Review of Systems  Constitutional: Positive for fever, chills and fatigue.  HENT: Negative for congestion and sore throat.     Respiratory: Negative for cough and shortness of breath.   Cardiovascular: Negative for chest pain.  Gastrointestinal: Negative for abdominal pain.  Genitourinary: Negative for dysuria.  Musculoskeletal: Positive for arthralgias.  Skin: Negative for rash.  Neurological: Positive for headaches.  Hematological: Negative for adenopathy.       Objective:   Physical Exam  Constitutional: She appears well-developed and well-nourished.  HENT:  Right Ear: External ear normal.  Left Ear: External ear normal.  Mouth/Throat: Oropharynx is clear and moist.  Neck: Neck supple.  Cardiovascular: Normal rate and regular rhythm.   No murmur heard. Pulmonary/Chest: Effort normal and breath sounds normal. No respiratory distress. She has no wheezes. She has no rales.  Musculoskeletal: She exhibits no edema.  Knees reveal no effusion. No warmth. Nontender  Lymphadenopathy:    She has no cervical adenopathy.  Skin: No rash noted.          Assessment & Plan:  Possible recent tick bite. Not confirmed. Patient presents now with nonspecific symptoms of reported low-grade fever, headache, mild arthralgias but no skin rash. She was in high endemic area in [name] for possible Lyme disease but does not have erythema migrans. We discussed limitations of antibody testing. We elected to go ahead and cover with doxycycline 100 mg twice a day for 14 days. Followup for persistent fever headache or other symptoms

## 2013-10-23 NOTE — Progress Notes (Signed)
Pre visit review using our clinic review tool, if applicable. No additional management support is needed unless otherwise documented below in the visit note. 

## 2013-12-17 ENCOUNTER — Encounter: Payer: Self-pay | Admitting: Family Medicine

## 2013-12-17 ENCOUNTER — Other Ambulatory Visit: Payer: Self-pay

## 2013-12-17 MED ORDER — DOXYCYCLINE HYCLATE 100 MG PO CAPS: 100.0000 mg | ORAL_CAPSULE | Freq: Two times a day (BID) | ORAL | Status: DC

## 2014-01-13 ENCOUNTER — Other Ambulatory Visit: Payer: Self-pay | Admitting: Obstetrics & Gynecology

## 2014-01-14 LAB — CYTOLOGY - PAP

## 2014-03-06 ENCOUNTER — Other Ambulatory Visit: Payer: Self-pay

## 2014-03-31 ENCOUNTER — Other Ambulatory Visit (INDEPENDENT_AMBULATORY_CARE_PROVIDER_SITE_OTHER): Payer: BC Managed Care – PPO

## 2014-03-31 DIAGNOSIS — E785 Hyperlipidemia, unspecified: Secondary | ICD-10-CM

## 2014-03-31 LAB — LIPID PANEL
Cholesterol: 253 mg/dL — ABNORMAL HIGH (ref 0–200)
HDL: 46 mg/dL (ref >39.00)
LDL Cholesterol: 176 mg/dL — ABNORMAL HIGH (ref 0–99)
NonHDL: 207
TRIGLYCERIDES: 154 mg/dL — AB (ref 0.0–149.0)
Total CHOL/HDL Ratio: 6
VLDL: 30.8 mg/dL (ref 0.0–40.0)

## 2014-04-01 ENCOUNTER — Ambulatory Visit (INDEPENDENT_AMBULATORY_CARE_PROVIDER_SITE_OTHER): Payer: BC Managed Care – PPO | Admitting: Family Medicine

## 2014-04-01 ENCOUNTER — Encounter: Payer: Self-pay | Admitting: Family Medicine

## 2014-04-01 VITALS — BP 128/82 | HR 82 | Temp 97.6°F | Wt 138.0 lb

## 2014-04-01 DIAGNOSIS — E785 Hyperlipidemia, unspecified: Secondary | ICD-10-CM

## 2014-04-01 NOTE — Progress Notes (Signed)
   Subjective:    Patient ID: Karen Salinas, female    DOB: 1955-06-19, 58 y.o.   MRN: [id number]  HPI   Patient is seen for follow-up dyslipidemia. Recent labs were obtained and her total cholesterol, triglycerides, and LDL were somewhat elevated. She was surprised because she actually had reduced her overall red meat consumption recently but possibly increased her carbohydrate intake. She does not have a history of peripheral vascular disease or CAD. No history of diabetes or hypertension. Father had CAD at 6 but no family history of premature CAD. She exercises regularly with walking. Her weight has been relatively stable  Past Medical History  Diagnosis Date  . IBS (irritable bowel syndrome)   . Low back pain   . Sinusitis   . Kidney stones   . Raynaud's disease   . Hyperlipidemia    Past Surgical History  Procedure Laterality Date  . Abdominal hysterectomy    . Colonoscopy  2002  . Tubal ligation    . Eye surgery for vision    . Dilation and curettage of uterus    . Cystoscopy/retrograde/ureteroscopy Right 12/16/2012    Procedure: CYSTOSCOPY/RETROGRADE/URETEROSCOPY;  Surgeon: Dutch Gray, MD;  Location: [facility];  Service: Urology;  Laterality: Right;  . Cystoscopy with stent placement Right 12/16/2012    Procedure: RIGHT URETERAL STENT PLACEMENT;  Surgeon: Dutch Gray, MD;  Location: [facility];  Service: Urology;  Laterality: Right;    reports that she has never smoked. She has never used smokeless tobacco. She reports that she drinks about 1.8 oz of alcohol per week. She reports that she does not use illicit drugs. family history includes Hyperlipidemia in her father; Hypertension in her father and mother; Stroke in her brother. Allergies  Allergen Reactions  . Shellfish Allergy Nausea And Vomiting      Review of Systems  Constitutional: Negative for fatigue.  Eyes: Negative for visual disturbance.  Respiratory: Negative for cough, chest tightness, shortness of breath and  wheezing.   Cardiovascular: Negative for chest pain, palpitations and leg swelling.  Neurological: Negative for dizziness, seizures, syncope, weakness, light-headedness and headaches.       Objective:   Physical Exam  Constitutional: She appears well-developed and well-nourished.  Cardiovascular: Normal rate and regular rhythm.   Pulmonary/Chest: Effort normal and breath sounds normal. No respiratory distress. She has no wheezes. She has no rales.          Assessment & Plan:  Hyperlipidemia. Her 10 year risk of CAD event is 3%. She has been previously intolerant of statins. She discontinued Zetia some time back and is not interested in going back on this. We discussed lifestyle management. We discussed Mediterranean type diet with low saturated and trans fats and she will aim for this pattern of eating. She requests repeat lipid in 4 months.

## 2014-04-01 NOTE — Progress Notes (Signed)
Pre visit review using our clinic review tool, if applicable. No additional management support is needed unless otherwise documented below in the visit note. 

## 2014-04-06 ENCOUNTER — Other Ambulatory Visit: Payer: BC Managed Care – PPO

## 2014-04-09 ENCOUNTER — Encounter: Payer: Self-pay | Admitting: Family Medicine

## 2014-04-09 ENCOUNTER — Ambulatory Visit (INDEPENDENT_AMBULATORY_CARE_PROVIDER_SITE_OTHER): Payer: BC Managed Care – PPO | Admitting: Family Medicine

## 2014-04-09 VITALS — BP 130/82 | HR 83 | Temp 97.8°F | Wt 136.0 lb

## 2014-04-09 DIAGNOSIS — M545 Low back pain, unspecified: Secondary | ICD-10-CM

## 2014-04-09 MED ORDER — CYCLOBENZAPRINE HCL 5 MG PO TABS: 5.0000 mg | ORAL_TABLET | Freq: Three times a day (TID) | ORAL | Status: DC | PRN

## 2014-04-09 NOTE — Progress Notes (Signed)
   Subjective:    Patient ID: Karen Salinas, female    DOB: Feb 02, 1956, 58 y.o.   MRN: [id number]  HPI   Patient seen with low back pain right lumbar region. Onset lower week ago with worsening yesterday. Denies any specific injury. Quality is sharp and achy quality. No radiculopathy symptoms. No numbness or weakness. No loss of bladder bowel control. She denies any chronic back difficulties. No fevers or chills. She's had difficulty with putting her socks alone and changing positions. She tried some ibuprofen with minimal relief. Has not tried any heat or ice.  Past Medical History  Diagnosis Date  . IBS (irritable bowel syndrome)   . Low back pain   . Sinusitis   . Kidney stones   . Raynaud's disease   . Hyperlipidemia    Past Surgical History  Procedure Laterality Date  . Abdominal hysterectomy    . Colonoscopy  2002  . Tubal ligation    . Eye surgery for vision    . Dilation and curettage of uterus    . Cystoscopy/retrograde/ureteroscopy Right 12/16/2012    Procedure: CYSTOSCOPY/RETROGRADE/URETEROSCOPY;  Surgeon: Dutch Gray, MD;  Location: [facility];  Service: Urology;  Laterality: Right;  . Cystoscopy with stent placement Right 12/16/2012    Procedure: RIGHT URETERAL STENT PLACEMENT;  Surgeon: Dutch Gray, MD;  Location: [facility];  Service: Urology;  Laterality: Right;    reports that she has never smoked. She has never used smokeless tobacco. She reports that she drinks about 1.8 oz of alcohol per week. She reports that she does not use illicit drugs. family history includes Hyperlipidemia in her father; Hypertension in her father and mother; Stroke in her brother. Allergies  Allergen Reactions  . Shellfish Allergy Nausea And Vomiting      Review of Systems  Constitutional: Negative for fever, chills, appetite change and unexpected weight change.  Respiratory: Negative for cough.   Gastrointestinal: Negative for abdominal pain.  Musculoskeletal: Positive for back pain.    Neurological: Negative for weakness and numbness.       Objective:   Physical Exam  Constitutional: She appears well-developed and well-nourished.  Cardiovascular: Normal rate and regular rhythm.   Pulmonary/Chest: Effort normal and breath sounds normal. No respiratory distress. She has no wheezes.  Musculoskeletal:  Straight leg raise are negative. She has some minimal tenderness right lower lumbar region. No spinal tenderness.  Neurological:  Deep tender reflexes are symmetric ankle and knee bilaterally. Full-strength with plantar flexion and dorsiflexion bilaterally          Assessment & Plan:  Lumbar back pain right lower side. Nonfocal exam. We've recommended stretches and low-dose Flexeril 5 mg 1-2 daily at bedtime and continue ibuprofen. Consider physical therapy if no better in 2 weeks. Walking as tolerated.

## 2014-04-09 NOTE — Patient Instructions (Signed)
Flexeril medication.  Take 1 for pain relief.  Can take up to 2 at night.  Back Pain, Adult Low back pain is very common. About 1 in 5 people have back pain.The cause of low back pain is rarely dangerous. The pain often gets better over time.About half of people with a sudden onset of back pain feel better in just 2 weeks. About 8 in 10 people feel better by 6 weeks.  CAUSES Some common causes of back pain include:  Strain of the muscles or ligaments supporting the spine.  Wear and tear (degeneration) of the spinal discs.  Arthritis.  Direct injury to the back. DIAGNOSIS Most of the time, the direct cause of low back pain is not known.However, back pain can be treated effectively even when the exact cause of the pain is unknown.Answering your caregiver's questions about your overall health and symptoms is one of the most accurate ways to make sure the cause of your pain is not dangerous. If your caregiver needs more information, he or she may order lab work or imaging tests (X-rays or MRIs).However, even if imaging tests show changes in your back, this usually does not require surgery. HOME CARE INSTRUCTIONS For many people, back pain returns.Since low back pain is rarely dangerous, it is often a condition that people can learn to [facility] their own.   Remain active. It is stressful on the back to sit or stand in one place. Do not sit, drive, or stand in one place for more than 30 minutes at a time. Take short walks on level surfaces as soon as pain allows.Try to increase the length of time you walk each day.  Do not stay in bed.Resting more than 1 or 2 days can delay your recovery.  Do not avoid exercise or work.Your body is made to move.It is not dangerous to be active, even though your back may hurt.Your back will likely heal faster if you return to being active before your pain is gone.  Pay attention to your body when you bend and lift. Many people have less discomfortwhen  lifting if they bend their knees, keep the load close to their bodies,and avoid twisting. Often, the most comfortable positions are those that put less stress on your recovering back.  Find a comfortable position to sleep. Use a firm mattress and lie on your side with your knees slightly bent. If you lie on your back, put a pillow under your knees.  Only take over-the-counter or prescription medicines as directed by your caregiver. Over-the-counter medicines to reduce pain and inflammation are often the most helpful.Your caregiver may prescribe muscle relaxant drugs.These medicines help dull your pain so you can more quickly return to your normal activities and healthy exercise.  Put ice on the injured area.  Put ice in a plastic bag.  Place a towel between your skin and the bag.  Leave the ice on for 15-20 minutes, 03-04 times a day for the first 2 to 3 days. After that, ice and heat may be alternated to reduce pain and spasms.  Ask your caregiver about trying back exercises and gentle massage. This may be of some benefit.  Avoid feeling anxious or stressed.Stress increases muscle tension and can worsen back pain.It is important to recognize when you are anxious or stressed and learn ways to manage it.Exercise is a great option. SEEK MEDICAL CARE IF:  You have pain that is not relieved with rest or medicine.  You have pain that does not  improve in 1 week.  You have new symptoms.  You are generally not feeling well. SEEK IMMEDIATE MEDICAL CARE IF:   You have pain that radiates from your back into your legs.  You develop new bowel or bladder control problems.  You have unusual weakness or numbness in your arms or legs.  You develop nausea or vomiting.  You develop abdominal pain.  You feel faint. Document Released: 05/08/2005 Document Revised: 11/07/2011 Document Reviewed: 09/09/2013 [facility] Patient Information 2015 [place], [place]. This information is not intended to  replace advice given to you by your health care provider. Make sure you discuss any questions you have with your health care provider.

## 2014-04-09 NOTE — Progress Notes (Signed)
Pre visit review using our clinic review tool, if applicable. No additional management support is needed unless otherwise documented below in the visit note. 

## 2014-07-24 ENCOUNTER — Other Ambulatory Visit: Payer: Self-pay

## 2014-07-24 DIAGNOSIS — Z1231 Encounter for screening mammogram for malignant neoplasm of breast: Secondary | ICD-10-CM

## 2014-08-07 ENCOUNTER — Other Ambulatory Visit (INDEPENDENT_AMBULATORY_CARE_PROVIDER_SITE_OTHER): Payer: BLUE CROSS/BLUE SHIELD

## 2014-08-07 DIAGNOSIS — E785 Hyperlipidemia, unspecified: Secondary | ICD-10-CM

## 2014-08-07 LAB — LIPID PANEL
CHOLESTEROL: 197 mg/dL (ref 0–200)
HDL: 51.7 mg/dL (ref >39.00)
LDL Cholesterol: 129 mg/dL — ABNORMAL HIGH (ref 0–99)
NonHDL: 145.3
TRIGLYCERIDES: 81 mg/dL (ref 0.0–149.0)
Total CHOL/HDL Ratio: 4
VLDL: 16.2 mg/dL (ref 0.0–40.0)

## 2014-08-08 ENCOUNTER — Encounter: Payer: Self-pay | Admitting: Family Medicine

## 2014-08-13 ENCOUNTER — Other Ambulatory Visit: Payer: BC Managed Care – PPO

## 2014-08-17 ENCOUNTER — Telehealth: Payer: Self-pay | Admitting: Family Medicine

## 2014-08-17 DIAGNOSIS — E785 Hyperlipidemia, unspecified: Secondary | ICD-10-CM

## 2014-08-17 NOTE — Telephone Encounter (Signed)
Patient called to schedule lipid panel for 02/09/15.  Can you please enter the order?

## 2014-08-17 NOTE — Telephone Encounter (Signed)
Future orders placed 

## 2014-09-25 ENCOUNTER — Ambulatory Visit
Admission: RE | Admit: 2014-09-25 | Discharge: 2014-09-25 | Disposition: A | Discharge status: Home / Self Care | Payer: BLUE CROSS/BLUE SHIELD | Source: Ambulatory Visit

## 2014-09-25 DIAGNOSIS — Z1231 Encounter for screening mammogram for malignant neoplasm of breast: Secondary | ICD-10-CM

## 2015-01-18 ENCOUNTER — Encounter: Payer: Self-pay | Admitting: Family Medicine

## 2015-01-18 ENCOUNTER — Other Ambulatory Visit: Payer: Self-pay | Admitting: Obstetrics & Gynecology

## 2015-01-19 LAB — CYTOLOGY - PAP

## 2015-01-22 ENCOUNTER — Other Ambulatory Visit: Payer: Self-pay | Admitting: Family Medicine

## 2015-01-22 ENCOUNTER — Encounter: Payer: Self-pay | Admitting: Family Medicine

## 2015-01-22 DIAGNOSIS — D582 Other hemoglobinopathies: Secondary | ICD-10-CM

## 2015-02-09 ENCOUNTER — Other Ambulatory Visit: Payer: BLUE CROSS/BLUE SHIELD

## 2015-02-10 ENCOUNTER — Other Ambulatory Visit (INDEPENDENT_AMBULATORY_CARE_PROVIDER_SITE_OTHER): Payer: BLUE CROSS/BLUE SHIELD

## 2015-02-10 DIAGNOSIS — E785 Hyperlipidemia, unspecified: Secondary | ICD-10-CM | POA: Diagnosis not present

## 2015-02-10 DIAGNOSIS — D582 Other hemoglobinopathies: Secondary | ICD-10-CM

## 2015-02-10 LAB — CBC WITH DIFFERENTIAL/PLATELET
BASOS ABS: 0 10*3/uL (ref 0.0–0.1)
Basophils Relative: 0.4 % (ref 0.0–3.0)
Eosinophils Absolute: 0.1 10*3/uL (ref 0.0–0.7)
Eosinophils Relative: 0.9 % (ref 0.0–5.0)
HCT: 45.5 % (ref 36.0–46.0)
Hemoglobin: 15.4 g/dL — ABNORMAL HIGH (ref 12.0–15.0)
LYMPHS ABS: 2.3 10*3/uL (ref 0.7–4.0)
Lymphocytes Relative: 38.2 % (ref 12.0–46.0)
MCHC: 33.8 g/dL (ref 30.0–36.0)
MCV: 92.9 fl (ref 78.0–100.0)
MONO ABS: 0.4 10*3/uL (ref 0.1–1.0)
MONOS PCT: 6.7 % (ref 3.0–12.0)
NEUTROS PCT: 53.8 % (ref 43.0–77.0)
Neutro Abs: 3.2 10*3/uL (ref 1.4–7.7)
Platelets: 291 10*3/uL (ref 150.0–400.0)
RBC: 4.89 Mil/uL (ref 3.87–5.11)
RDW: 12.2 % (ref 11.5–15.5)
WBC: 5.9 10*3/uL (ref 4.0–10.5)

## 2015-02-10 LAB — LIPID PANEL
Cholesterol: 204 mg/dL — ABNORMAL HIGH (ref 0–200)
HDL: 43.8 mg/dL (ref >39.00)
LDL Cholesterol: 133 mg/dL — ABNORMAL HIGH (ref 0–99)
NonHDL: 159.73
Total CHOL/HDL Ratio: 5
Triglycerides: 135 mg/dL (ref 0.0–149.0)
VLDL: 27 mg/dL (ref 0.0–40.0)

## 2015-02-17 ENCOUNTER — Encounter: Payer: Self-pay | Admitting: Family Medicine

## 2015-02-17 ENCOUNTER — Ambulatory Visit (INDEPENDENT_AMBULATORY_CARE_PROVIDER_SITE_OTHER): Payer: BLUE CROSS/BLUE SHIELD | Admitting: Family Medicine

## 2015-02-17 ENCOUNTER — Other Ambulatory Visit: Payer: BLUE CROSS/BLUE SHIELD

## 2015-02-17 VITALS — BP 140/90 | HR 90 | Temp 97.7°F | Ht 62.0 in | Wt 133.4 lb

## 2015-02-17 DIAGNOSIS — R030 Elevated blood-pressure reading, without diagnosis of hypertension: Secondary | ICD-10-CM | POA: Diagnosis not present

## 2015-02-17 DIAGNOSIS — E785 Hyperlipidemia, unspecified: Secondary | ICD-10-CM | POA: Diagnosis not present

## 2015-02-17 DIAGNOSIS — D582 Other hemoglobinopathies: Secondary | ICD-10-CM

## 2015-02-17 MED ORDER — LOSARTAN POTASSIUM 25 MG PO TABS: 25.0000 mg | ORAL_TABLET | Freq: Every day | ORAL | Status: DC

## 2015-02-17 NOTE — Progress Notes (Signed)
Pre visit review using our clinic review tool, if applicable. No additional management support is needed unless otherwise documented below in the visit note. 

## 2015-02-17 NOTE — Patient Instructions (Signed)
DASH Eating Plan °DASH stands for "Dietary Approaches to Stop Hypertension." The DASH eating plan is a healthy eating plan that has been shown to reduce high blood pressure (hypertension). Additional health benefits may include reducing the risk of type 2 diabetes mellitus, heart disease, and stroke. The DASH eating plan may also help with weight loss. °WHAT DO I NEED TO KNOW ABOUT THE DASH EATING PLAN? °For the DASH eating plan, you will follow these general guidelines: °· Choose foods with a percent daily value for sodium of less than 5% (as listed on the food label). °· Use salt-free seasonings or herbs instead of table salt or sea salt. °· Check with your health care Kore Madlock or pharmacist before using salt substitutes. °· Eat lower-sodium products, often labeled as "lower sodium" or "no salt added." °· Eat fresh foods. °· Eat more vegetables, fruits, and low-fat dairy products. °· Choose whole grains. Look for the word "whole" as the first word in the ingredient list. °· Choose fish and skinless chicken or turkey more often than red meat. Limit fish, poultry, and meat to 6 oz (170 g) each day. °· Limit sweets, desserts, sugars, and sugary drinks. °· Choose heart-healthy fats. °· Limit cheese to 1 oz (28 g) per day. °· Eat more home-cooked food and less restaurant, buffet, and fast food. °· Limit fried foods. °· Cook foods using methods other than frying. °· Limit canned vegetables. If you do use them, rinse them well to decrease the sodium. °· When eating at a restaurant, ask that your food be prepared with less salt, or no salt if possible. °WHAT FOODS CAN I EAT? °Seek help from a dietitian for individual calorie needs. °Grains °Whole grain or whole wheat bread. Brown rice. Whole grain or whole wheat pasta. Quinoa, bulgur, and whole grain cereals. Low-sodium cereals. Corn or whole wheat flour tortillas. Whole grain cornbread. Whole grain crackers. Low-sodium crackers. °Vegetables °Fresh or frozen vegetables  (raw, steamed, roasted, or grilled). Low-sodium or reduced-sodium tomato and vegetable juices. Low-sodium or reduced-sodium tomato sauce and paste. Low-sodium or reduced-sodium canned vegetables.  °Fruits °All fresh, canned (in natural juice), or frozen fruits. °Meat and Other Protein Products °Ground beef (85% or leaner), grass-fed beef, or beef trimmed of fat. Skinless chicken or turkey. Ground chicken or turkey. Pork trimmed of fat. All fish and seafood. Eggs. Dried beans, peas, or lentils. Unsalted nuts and seeds. Unsalted canned beans. °Dairy °Low-fat dairy products, such as skim or 1% milk, 2% or reduced-fat cheeses, low-fat ricotta or cottage cheese, or plain low-fat yogurt. Low-sodium or reduced-sodium cheeses. °Fats and Oils °Tub margarines without trans fats. Light or reduced-fat mayonnaise and salad dressings (reduced sodium). Avocado. Safflower, olive, or canola oils. Natural peanut or almond butter. °Other °Unsalted popcorn and pretzels. °The items listed above may not be a complete list of recommended foods or beverages. Contact your dietitian for more options. °WHAT FOODS ARE NOT RECOMMENDED? °Grains °White bread. White pasta. White rice. Refined cornbread. Bagels and croissants. Crackers that contain trans fat. °Vegetables °Creamed or fried vegetables. Vegetables in a cheese sauce. Regular canned vegetables. Regular canned tomato sauce and paste. Regular tomato and vegetable juices. °Fruits °Dried fruits. Canned fruit in light or heavy syrup. Fruit juice. °Meat and Other Protein Products °Fatty cuts of meat. Ribs, chicken wings, bacon, sausage, bologna, salami, chitterlings, fatback, hot dogs, bratwurst, and packaged luncheon meats. Salted nuts and seeds. Canned beans with salt. °Dairy °Whole or 2% milk, cream, half-and-half, and cream cheese. Whole-fat or sweetened yogurt. Full-fat   cheeses or blue cheese. Nondairy creamers and whipped toppings. Processed cheese, cheese spreads, or cheese  curds. °Condiments °Onion and garlic salt, seasoned salt, table salt, and sea salt. Canned and packaged gravies. Worcestershire sauce. Tartar sauce. Barbecue sauce. Teriyaki sauce. Soy sauce, including reduced sodium. Steak sauce. Fish sauce. Oyster sauce. Cocktail sauce. Horseradish. Ketchup and mustard. Meat flavorings and tenderizers. Bouillon cubes. Hot sauce. Tabasco sauce. Marinades. Taco seasonings. Relishes. °Fats and Oils °Butter, stick margarine, lard, shortening, ghee, and bacon fat. Coconut, palm kernel, or palm oils. Regular salad dressings. °Other °Pickles and olives. Salted popcorn and pretzels. °The items listed above may not be a complete list of foods and beverages to avoid. Contact your dietitian for more information. °WHERE CAN I FIND MORE INFORMATION? °National Heart, [employer], and [employer] Institute: www.nhlbi.nih.gov/health/health-topics/topics/dash/ °Document Released: 04/27/2011 Document Revised: 09/22/2013 Document Reviewed: 03/12/2013 °Exit[employer]® Patient Information ©2015 [employer]. This information is not intended to replace advice given to you by your health care Teandra Harlan. Make sure you discuss any questions you have with your health care Dessie Tatem. ° °

## 2015-02-17 NOTE — Progress Notes (Signed)
   Subjective:    Patient ID: Karen Salinas, female    DOB: [birth year], 59 y.o.   MRN: [id number]  HPI Patient here to discuss several items  History of elevated blood pressure. Very strong family history of hypertension. She brings in several readings over the past couple months. She's had several readings over 140 and occasionally even over 195 systolic. No headaches. No dizziness. Exercises regularly. Weight stable. Infrequent alcohol use. Never treated for hypertension. Blood pressure starting to elevate the home readings  Hyperlipidemia. Overall low risk for CAD. Recent lipids obtained and reviewed with patient. She has minimally elevated LDL with recent HDL 43. Nonsmoker. No history of diabetes.  Patient requested CBC. She was at gynecologist and reportedly had hemoglobin over 17. No family history of polycythemia vera. Nonsmoker. Recent repeat hemoglobin here 15.4. This has been stable over the past couple of years.  Past Medical History  Diagnosis Date  . IBS (irritable bowel syndrome)   . Low back pain   . Sinusitis   . Kidney stones   . Raynaud's disease   . Hyperlipidemia    Past Surgical History  Procedure Laterality Date  . Abdominal hysterectomy    . Colonoscopy  2002  . Tubal ligation    . Eye surgery for vision    . Dilation and curettage of uterus    . Cystoscopy/retrograde/ureteroscopy Right 12/16/2012    Procedure: CYSTOSCOPY/RETROGRADE/URETEROSCOPY;  Surgeon: Dutch Gray, MD;  Location: [facility];  Service: Urology;  Laterality: Right;  . Cystoscopy with stent placement Right 12/16/2012    Procedure: RIGHT URETERAL STENT PLACEMENT;  Surgeon: Dutch Gray, MD;  Location: [facility];  Service: Urology;  Laterality: Right;    reports that she has never smoked. She has never used smokeless tobacco. She reports that she drinks about 1.8 oz of alcohol per week. She reports that she does not use illicit drugs. family history includes Hyperlipidemia in her father; Hypertension in  her father and mother; Stroke in her brother. Allergies  Allergen Reactions  . Shellfish Allergy Nausea And Vomiting      Review of Systems  Constitutional: Negative for fatigue and unexpected weight change.  Eyes: Negative for visual disturbance.  Respiratory: Negative for cough, chest tightness, shortness of breath and wheezing.   Cardiovascular: Negative for chest pain, palpitations and leg swelling.  Endocrine: Negative for polydipsia and polyuria.  Neurological: Negative for dizziness, seizures, syncope, weakness, light-headedness and headaches.       Objective:   Physical Exam  Constitutional: She appears well-developed and well-nourished. No distress.  Neck: Neck supple. No thyromegaly present.  Cardiovascular: Normal rate and regular rhythm.   Pulmonary/Chest: Effort normal and breath sounds normal. No respiratory distress. She has no wheezes. She has no rales.  Musculoskeletal: She exhibits no edema.  Lymphadenopathy:    She has no cervical adenopathy.          Assessment & Plan:  #1 hypertension. She's had several readings by home readings recently over 140 and even occasionally over 093 systolic. Repeat blood pressure today by me left arm seated at rest 150/84. Start low-dose losartan 25 mg once daily. Recheck blood pressure one month and bring home readings then #2 hyperlipidemia. Overall low risk for CAD. Continue lifestyle management with low saturated fat diet and regular exercise #3 mild elevated hemoglobin.Clinically no concern. This has been stable over couple of years. Reassurance

## 2015-03-16 ENCOUNTER — Ambulatory Visit (INDEPENDENT_AMBULATORY_CARE_PROVIDER_SITE_OTHER): Payer: BLUE CROSS/BLUE SHIELD | Admitting: Family Medicine

## 2015-03-16 ENCOUNTER — Encounter: Payer: Self-pay | Admitting: Family Medicine

## 2015-03-16 VITALS — BP 122/80 | HR 85 | Temp 97.8°F | Wt 129.5 lb

## 2015-03-16 DIAGNOSIS — I10 Essential (primary) hypertension: Secondary | ICD-10-CM | POA: Insufficient documentation

## 2015-03-16 DIAGNOSIS — E785 Hyperlipidemia, unspecified: Secondary | ICD-10-CM | POA: Diagnosis not present

## 2015-03-16 NOTE — Progress Notes (Signed)
Pre visit review using our clinic review tool, if applicable. No additional management support is needed unless otherwise documented below in the visit note. 

## 2015-03-16 NOTE — Progress Notes (Signed)
   Subjective:    Patient ID: Karen Salinas, female    DOB: 1956/02/01, 59 y.o.   MRN: [id number]  HPI Patient seen for follow-up elevated blood pressure/ hypertension.  She had several readings over 628-366 systolic. Started low-dose losartan 25 mg daily. She is gotten consistently less than 294 systolic since then. She feels well overall. She broke her toe recently and this has curtailed her walking. No dizziness. No chest pains. No medication side effects.  Past Medical History  Diagnosis Date  . IBS (irritable bowel syndrome)   . Low back pain   . Sinusitis   . Kidney stones   . Raynaud's disease   . Hyperlipidemia    Past Surgical History  Procedure Laterality Date  . Abdominal hysterectomy    . Colonoscopy  2002  . Tubal ligation    . Eye surgery for vision    . Dilation and curettage of uterus    . Cystoscopy/retrograde/ureteroscopy Right 12/16/2012    Procedure: CYSTOSCOPY/RETROGRADE/URETEROSCOPY;  Surgeon: Dutch Gray, MD;  Location: [facility];  Service: Urology;  Laterality: Right;  . Cystoscopy with stent placement Right 12/16/2012    Procedure: RIGHT URETERAL STENT PLACEMENT;  Surgeon: Dutch Gray, MD;  Location: [facility];  Service: Urology;  Laterality: Right;    reports that she has never smoked. She has never used smokeless tobacco. She reports that she drinks about 1.8 oz of alcohol per week. She reports that she does not use illicit drugs. family history includes Hyperlipidemia in her father; Hypertension in her father and mother; Stroke in her brother. Allergies  Allergen Reactions  . Shellfish Allergy Nausea And Vomiting      Review of Systems  Constitutional: Negative for fatigue and unexpected weight change.  Eyes: Negative for visual disturbance.  Respiratory: Negative for cough, chest tightness, shortness of breath and wheezing.   Cardiovascular: Negative for chest pain, palpitations and leg swelling.  Neurological: Negative for dizziness, seizures, syncope,  weakness, light-headedness and headaches.       Objective:   Physical Exam  Constitutional: She appears well-developed and well-nourished.  Cardiovascular: Normal rate and regular rhythm.  Exam reveals no gallop.   No murmur heard. Pulmonary/Chest: Effort normal and breath sounds normal. No respiratory distress. She has no wheezes. She has no rales.  Musculoskeletal: She exhibits no edema.          Assessment & Plan:  Hypertension. Improved. Continue losartan 25 mg daily. Continue regular aerobic exercise with walking. Routine follow-up 6 months.

## 2015-03-17 ENCOUNTER — Ambulatory Visit: Payer: BLUE CROSS/BLUE SHIELD | Admitting: Family Medicine

## 2015-06-28 ENCOUNTER — Ambulatory Visit (INDEPENDENT_AMBULATORY_CARE_PROVIDER_SITE_OTHER): Payer: BLUE CROSS/BLUE SHIELD | Admitting: Family Medicine

## 2015-06-28 VITALS — BP 132/78 | HR 105 | Temp 98.4°F | Ht 62.0 in | Wt 131.3 lb

## 2015-06-28 DIAGNOSIS — R202 Paresthesia of skin: Secondary | ICD-10-CM | POA: Diagnosis not present

## 2015-06-28 DIAGNOSIS — I10 Essential (primary) hypertension: Secondary | ICD-10-CM | POA: Diagnosis not present

## 2015-06-28 NOTE — Patient Instructions (Signed)
Follow up for any progressive numbness in leg or especially weakness.

## 2015-06-28 NOTE — Progress Notes (Signed)
Pre visit review using our clinic review tool, if applicable. No additional management support is needed unless otherwise documented below in the visit note. 

## 2015-06-28 NOTE — Progress Notes (Signed)
Subjective:    Patient ID: Karen Salinas, female    DOB: Apr 14, 1956, 60 y.o.   MRN: [id number]  HPI   patient seen with complaints of vague paresthesia intermittently right lateral upper leg. Symptoms have gone on now for the past week or so. She does recall doing some painting where she was leaning against something with her knee for prolonged period. No specific injury though. She did try some icing without improvement  She has not noted any weakness such as foot drop.  Symptoms are relatively mild. She has mild burning sensation but mostly mild dysesthesia.  Denies any low back pain. No skin rash.  No exacerbating or alleviating factors.   Generally very healthy. Exercising several times for week. Walking without difficulty. Denies any other areas of numbness   She has hypertension which is been stable on low-dose losartan. Repeat day 132/78 with recheck  Past Medical History  Diagnosis Date  . IBS (irritable bowel syndrome)   . Low back pain   . Sinusitis   . Kidney stones   . Raynaud's disease   . Hyperlipidemia    Past Surgical History  Procedure Laterality Date  . Abdominal hysterectomy    . Colonoscopy  2002  . Tubal ligation    . Eye surgery for vision    . Dilation and curettage of uterus    . Cystoscopy/retrograde/ureteroscopy Right 12/16/2012    Procedure: CYSTOSCOPY/RETROGRADE/URETEROSCOPY;  Surgeon: Dutch Gray, MD;  Location: [facility];  Service: Urology;  Laterality: Right;  . Cystoscopy with stent placement Right 12/16/2012    Procedure: RIGHT URETERAL STENT PLACEMENT;  Surgeon: Dutch Gray, MD;  Location: [facility];  Service: Urology;  Laterality: Right;    reports that she has never smoked. She has never used smokeless tobacco. She reports that she drinks about 1.8 oz of alcohol per week. She reports that she does not use illicit drugs. family history includes Hyperlipidemia in her father; Hypertension in her father and mother; Stroke in her brother. Allergies    Allergen Reactions  . Shellfish Allergy Nausea And Vomiting     Review of Systems  Constitutional: Negative for fatigue.  Eyes: Negative for visual disturbance.  Respiratory: Negative for cough, chest tightness, shortness of breath and wheezing.   Cardiovascular: Negative for chest pain, palpitations and leg swelling.  Genitourinary: Negative for dysuria.  Musculoskeletal: Negative for back pain.  Skin: Negative for rash.  Neurological: Negative for dizziness, seizures, syncope, weakness, light-headedness and headaches.       Objective:   Physical Exam  Constitutional: She appears well-developed and well-nourished.  Eyes: Pupils are equal, round, and reactive to light.  Neck: Neck supple. No JVD present. No thyromegaly present.  Cardiovascular: Normal rate and regular rhythm.  Exam reveals no gallop.   Pulmonary/Chest: Effort normal and breath sounds normal. No respiratory distress. She has no wheezes. She has no rales.  Musculoskeletal: She exhibits no edema.  Neurological: She is alert.  2+ reflex knee and ankle bilaterally. She has full strength with plantar flexion, dorsiflexion, and knee extension bilaterally. Normal sensory function with monofilament and to touch          Assessment & Plan:  #1 mild paresthesia right upper lateral leg. Question peroneal nerve contusion or superficial peripheral nerve irritation. She does not have any associated weakness and minimally symptomatic. Avoid icing this area. Avoid any compression around the knee. Avoid any direct pressure to the lateral knee region. Touch base if symptoms not resolving next couple of  weeks  #2 hypertension stable. Continue low-dose losartan. Schedule complete physical for May

## 2015-06-30 ENCOUNTER — Other Ambulatory Visit (HOSPITAL_COMMUNITY): Payer: Self-pay | Admitting: Urology

## 2015-06-30 DIAGNOSIS — D49519 Neoplasm of unspecified behavior of unspecified kidney: Secondary | ICD-10-CM

## 2015-08-04 ENCOUNTER — Ambulatory Visit (HOSPITAL_COMMUNITY)
Admission: RE | Admit: 2015-08-04 | Discharge: 2015-08-04 | Disposition: A | Discharge status: Home / Self Care | Payer: BLUE CROSS/BLUE SHIELD | Source: Ambulatory Visit | Attending: Urology | Admitting: Urology

## 2015-08-04 DIAGNOSIS — D49519 Neoplasm of unspecified behavior of unspecified kidney: Secondary | ICD-10-CM | POA: Diagnosis not present

## 2015-08-04 LAB — POCT I-STAT CREATININE: CREATININE: 0.9 mg/dL (ref 0.44–1.00)

## 2015-08-04 MED ORDER — GADOBENATE DIMEGLUMINE 529 MG/ML IV SOLN
15.0000 mL | Freq: Once | INTRAVENOUS | Status: AC | PRN
  Administered 2015-08-04: 12 mL via INTRAVENOUS

## 2015-08-23 ENCOUNTER — Other Ambulatory Visit: Payer: Self-pay

## 2015-08-23 DIAGNOSIS — Z1231 Encounter for screening mammogram for malignant neoplasm of breast: Secondary | ICD-10-CM

## 2015-08-26 ENCOUNTER — Other Ambulatory Visit: Payer: Self-pay | Admitting: Family Medicine

## 2015-08-26 MED ORDER — LOSARTAN POTASSIUM 25 MG PO TABS: 25.0000 mg | ORAL_TABLET | Freq: Every day | ORAL | Status: DC

## 2015-09-07 ENCOUNTER — Other Ambulatory Visit: Payer: BLUE CROSS/BLUE SHIELD

## 2015-09-10 ENCOUNTER — Encounter: Payer: BLUE CROSS/BLUE SHIELD | Admitting: Family Medicine

## 2015-09-14 ENCOUNTER — Encounter: Payer: BLUE CROSS/BLUE SHIELD | Admitting: Family Medicine

## 2015-09-30 ENCOUNTER — Other Ambulatory Visit (INDEPENDENT_AMBULATORY_CARE_PROVIDER_SITE_OTHER): Payer: BLUE CROSS/BLUE SHIELD

## 2015-09-30 DIAGNOSIS — Z0000 Encounter for general adult medical examination without abnormal findings: Secondary | ICD-10-CM

## 2015-09-30 LAB — CBC WITH DIFFERENTIAL/PLATELET
BASOS ABS: 0 10*3/uL (ref 0.0–0.1)
Basophils Relative: 0.4 % (ref 0.0–3.0)
Eosinophils Absolute: 0 10*3/uL (ref 0.0–0.7)
Eosinophils Relative: 0.8 % (ref 0.0–5.0)
HEMATOCRIT: 40.8 % (ref 36.0–46.0)
Hemoglobin: 13.8 g/dL (ref 12.0–15.0)
LYMPHS PCT: 30.9 % (ref 12.0–46.0)
Lymphs Abs: 1.8 10*3/uL (ref 0.7–4.0)
MCHC: 33.8 g/dL (ref 30.0–36.0)
MCV: 92.4 fl (ref 78.0–100.0)
MONOS PCT: 7.1 % (ref 3.0–12.0)
Monocytes Absolute: 0.4 10*3/uL (ref 0.1–1.0)
NEUTROS ABS: 3.5 10*3/uL (ref 1.4–7.7)
Neutrophils Relative %: 60.8 % (ref 43.0–77.0)
PLATELETS: 274 10*3/uL (ref 150.0–400.0)
RBC: 4.42 Mil/uL (ref 3.87–5.11)
RDW: 12 % (ref 11.5–15.5)
WBC: 5.7 10*3/uL (ref 4.0–10.5)

## 2015-09-30 LAB — BASIC METABOLIC PANEL
BUN: 21 mg/dL (ref 6–23)
CALCIUM: 9.3 mg/dL (ref 8.4–10.5)
CHLORIDE: 108 meq/L (ref 96–112)
CO2: 26 meq/L (ref 19–32)
Creatinine, Ser: 0.94 mg/dL (ref 0.40–1.20)
GFR: 64.62 mL/min (ref >60.00)
Glucose, Bld: 101 mg/dL — ABNORMAL HIGH (ref 70–99)
POTASSIUM: 4.4 meq/L (ref 3.5–5.1)
SODIUM: 141 meq/L (ref 135–145)

## 2015-09-30 LAB — LIPID PANEL
CHOL/HDL RATIO: 5
Cholesterol: 199 mg/dL (ref 0–200)
HDL: 43 mg/dL (ref >39.00)
LDL Cholesterol: 140 mg/dL — ABNORMAL HIGH (ref 0–99)
NONHDL: 156.17
Triglycerides: 80 mg/dL (ref 0.0–149.0)
VLDL: 16 mg/dL (ref 0.0–40.0)

## 2015-09-30 LAB — TSH: TSH: 4.08 u[IU]/mL (ref 0.35–4.50)

## 2015-09-30 LAB — HEPATIC FUNCTION PANEL
ALBUMIN: 4.3 g/dL (ref 3.5–5.2)
ALK PHOS: 57 U/L (ref 39–117)
ALT: 15 U/L (ref 0–35)
AST: 13 U/L (ref 0–37)
Bilirubin, Direct: 0.1 mg/dL (ref 0.0–0.3)
TOTAL PROTEIN: 6.7 g/dL (ref 6.0–8.3)
Total Bilirubin: 0.5 mg/dL (ref 0.2–1.2)

## 2015-10-04 ENCOUNTER — Ambulatory Visit (INDEPENDENT_AMBULATORY_CARE_PROVIDER_SITE_OTHER): Payer: BLUE CROSS/BLUE SHIELD | Admitting: Family Medicine

## 2015-10-04 ENCOUNTER — Encounter: Payer: Self-pay | Admitting: Family Medicine

## 2015-10-04 VITALS — BP 120/82 | HR 102 | Temp 97.7°F | Ht 62.0 in | Wt 128.0 lb

## 2015-10-04 DIAGNOSIS — Z0000 Encounter for general adult medical examination without abnormal findings: Secondary | ICD-10-CM

## 2015-10-04 DIAGNOSIS — E785 Hyperlipidemia, unspecified: Secondary | ICD-10-CM

## 2015-10-04 NOTE — Progress Notes (Signed)
Pre visit review using our clinic review tool, if applicable. No additional management support is needed unless otherwise documented below in the visit note. 

## 2015-10-04 NOTE — Progress Notes (Signed)
Subjective:    Patient ID: Karen Salinas, female    DOB: 03/02/1956, 60 y.o.   MRN: [id number]  HPI Patient here for physical exam She continues to see gynecologist yearly for Pap smear though she has had previous hysterectomy for benign disease. She has hypertension which is controlled with low-dose losartan. Home readings are excellent. Immunizations up-to-date. Colonoscopy up-to-date. Exercises 2-3 days per week. Nonsmoker.  Past Medical History  Diagnosis Date  . IBS (irritable bowel syndrome)   . Low back pain   . Sinusitis   . Kidney stones   . Raynaud's disease   . Hyperlipidemia    Past Surgical History  Procedure Laterality Date  . Abdominal hysterectomy    . Colonoscopy  2002  . Tubal ligation    . Eye surgery for vision    . Dilation and curettage of uterus    . Cystoscopy/retrograde/ureteroscopy Right 12/16/2012    Procedure: CYSTOSCOPY/RETROGRADE/URETEROSCOPY;  Surgeon: Dutch Gray, MD;  Location: [facility];  Service: Urology;  Laterality: Right;  . Cystoscopy with stent placement Right 12/16/2012    Procedure: RIGHT URETERAL STENT PLACEMENT;  Surgeon: Dutch Gray, MD;  Location: [facility];  Service: Urology;  Laterality: Right;    reports that she has never smoked. She has never used smokeless tobacco. She reports that she drinks about 1.8 oz of alcohol per week. She reports that she does not use illicit drugs. family history includes Hyperlipidemia in her father; Hypertension in her father and mother; Stroke in her brother. Allergies  Allergen Reactions  . Shellfish Allergy Nausea And Vomiting      Review of Systems  Constitutional: Negative for fever, activity change, appetite change, fatigue and unexpected weight change.  HENT: Negative for ear pain, hearing loss, sore throat and trouble swallowing.   Eyes: Negative for visual disturbance.  Respiratory: Negative for cough and shortness of breath.   Cardiovascular: Negative for chest pain and palpitations.    Gastrointestinal: Negative for abdominal pain, diarrhea, constipation and blood in stool.  Genitourinary: Negative for dysuria and hematuria.  Musculoskeletal: Negative for myalgias, back pain and arthralgias.  Skin: Negative for rash.  Neurological: Negative for dizziness, syncope and headaches.  Hematological: Negative for adenopathy.  Psychiatric/Behavioral: Negative for confusion and dysphoric mood.       Objective:   Physical Exam  Constitutional: She is oriented to person, place, and time. She appears well-developed and well-nourished.  HENT:  Head: Normocephalic and atraumatic.  Eyes: EOM are normal. Pupils are equal, round, and reactive to light.  Neck: Normal range of motion. Neck supple. No thyromegaly present.  Cardiovascular: Normal rate, regular rhythm and normal heart sounds.   No murmur heard. Pulmonary/Chest: Breath sounds normal. No respiratory distress. She has no wheezes. She has no rales.  Abdominal: Soft. Bowel sounds are normal. She exhibits no distension and no mass. There is no tenderness. There is no rebound and no guarding.  Genitourinary:  Per GYN  Musculoskeletal: Normal range of motion. She exhibits no edema.  Lymphadenopathy:    She has no cervical adenopathy.  Neurological: She is alert and oriented to person, place, and time. She displays normal reflexes. No cranial nerve deficit.  Skin: No rash noted.  Psychiatric: She has a normal mood and affect. Her behavior is normal. Judgment and thought content normal.          Assessment & Plan:  Physical exam. Labs reviewed. She has mild hyperlipidemia. Glucose 101. We discussed low glycemic diet. Continue regular exercise habits. She  will set up repeat mammogram. Patient requests repeat lipids in 6 months and order given  Eulas Post MD [facility]

## 2015-10-06 DIAGNOSIS — L821 Other seborrheic keratosis: Secondary | ICD-10-CM | POA: Diagnosis not present

## 2015-10-06 DIAGNOSIS — D485 Neoplasm of uncertain behavior of skin: Secondary | ICD-10-CM | POA: Diagnosis not present

## 2015-10-06 DIAGNOSIS — Z86018 Personal history of other benign neoplasm: Secondary | ICD-10-CM | POA: Diagnosis not present

## 2015-10-06 DIAGNOSIS — D225 Melanocytic nevi of trunk: Secondary | ICD-10-CM | POA: Diagnosis not present

## 2015-10-07 ENCOUNTER — Ambulatory Visit
Admission: RE | Admit: 2015-10-07 | Discharge: 2015-10-07 | Disposition: A | Discharge status: Home / Self Care | Payer: BLUE CROSS/BLUE SHIELD | Source: Ambulatory Visit

## 2015-10-07 DIAGNOSIS — Z1231 Encounter for screening mammogram for malignant neoplasm of breast: Secondary | ICD-10-CM | POA: Diagnosis not present

## 2016-01-19 DIAGNOSIS — Z01419 Encounter for gynecological examination (general) (routine) without abnormal findings: Secondary | ICD-10-CM | POA: Diagnosis not present

## 2016-01-19 DIAGNOSIS — Z6824 Body mass index (BMI) 24.0-24.9, adult: Secondary | ICD-10-CM | POA: Diagnosis not present

## 2016-01-19 DIAGNOSIS — Z13820 Encounter for screening for osteoporosis: Secondary | ICD-10-CM | POA: Diagnosis not present

## 2016-02-21 DIAGNOSIS — Z23 Encounter for immunization: Secondary | ICD-10-CM | POA: Diagnosis not present

## 2016-03-31 ENCOUNTER — Other Ambulatory Visit (INDEPENDENT_AMBULATORY_CARE_PROVIDER_SITE_OTHER): Payer: BLUE CROSS/BLUE SHIELD

## 2016-03-31 ENCOUNTER — Encounter: Payer: Self-pay | Admitting: Family Medicine

## 2016-03-31 DIAGNOSIS — E785 Hyperlipidemia, unspecified: Secondary | ICD-10-CM

## 2016-03-31 LAB — BASIC METABOLIC PANEL
BUN: 19 mg/dL (ref 6–23)
CHLORIDE: 105 meq/L (ref 96–112)
CO2: 27 mEq/L (ref 19–32)
Calcium: 9.6 mg/dL (ref 8.4–10.5)
Creatinine, Ser: 1.01 mg/dL (ref 0.40–1.20)
GFR: 59.38 mL/min — AB (ref >60.00)
Glucose, Bld: 88 mg/dL (ref 70–99)
Potassium: 3.6 mEq/L (ref 3.5–5.1)
Sodium: 141 mEq/L (ref 135–145)

## 2016-03-31 LAB — LIPID PANEL
CHOL/HDL RATIO: 4
Cholesterol: 205 mg/dL — ABNORMAL HIGH (ref 0–200)
HDL: 50.6 mg/dL (ref >39.00)
LDL CALC: 136 mg/dL — AB (ref 0–99)
NONHDL: 154.23
Triglycerides: 89 mg/dL (ref 0.0–149.0)
VLDL: 17.8 mg/dL (ref 0.0–40.0)

## 2016-04-05 ENCOUNTER — Other Ambulatory Visit: Payer: Self-pay

## 2016-04-05 DIAGNOSIS — E785 Hyperlipidemia, unspecified: Secondary | ICD-10-CM

## 2016-04-10 ENCOUNTER — Other Ambulatory Visit: Payer: BLUE CROSS/BLUE SHIELD

## 2016-05-25 ENCOUNTER — Other Ambulatory Visit: Payer: Self-pay | Admitting: Family Medicine

## 2016-08-04 ENCOUNTER — Other Ambulatory Visit: Payer: Self-pay | Admitting: Obstetrics & Gynecology

## 2016-08-04 DIAGNOSIS — Z1231 Encounter for screening mammogram for malignant neoplasm of breast: Secondary | ICD-10-CM

## 2016-10-09 ENCOUNTER — Other Ambulatory Visit (INDEPENDENT_AMBULATORY_CARE_PROVIDER_SITE_OTHER): Payer: BLUE CROSS/BLUE SHIELD

## 2016-10-09 DIAGNOSIS — R030 Elevated blood-pressure reading, without diagnosis of hypertension: Secondary | ICD-10-CM | POA: Diagnosis not present

## 2016-10-09 DIAGNOSIS — I10 Essential (primary) hypertension: Secondary | ICD-10-CM | POA: Diagnosis not present

## 2016-10-09 DIAGNOSIS — E785 Hyperlipidemia, unspecified: Secondary | ICD-10-CM

## 2016-10-09 LAB — CBC WITH DIFFERENTIAL/PLATELET
BASOS ABS: 0 10*3/uL (ref 0.0–0.1)
Basophils Relative: 0.5 % (ref 0.0–3.0)
Eosinophils Absolute: 0.1 10*3/uL (ref 0.0–0.7)
Eosinophils Relative: 1.5 % (ref 0.0–5.0)
HEMATOCRIT: 39.7 % (ref 36.0–46.0)
HEMOGLOBIN: 13.2 g/dL (ref 12.0–15.0)
Lymphocytes Relative: 49.4 % — ABNORMAL HIGH (ref 12.0–46.0)
Lymphs Abs: 2.1 10*3/uL (ref 0.7–4.0)
MCHC: 33.3 g/dL (ref 30.0–36.0)
MCV: 89.5 fl (ref 78.0–100.0)
MONOS PCT: 8.3 % (ref 3.0–12.0)
Monocytes Absolute: 0.4 10*3/uL (ref 0.1–1.0)
NEUTROS PCT: 40.3 % — AB (ref 43.0–77.0)
Neutro Abs: 1.7 10*3/uL (ref 1.4–7.7)
Platelets: 289 10*3/uL (ref 150.0–400.0)
RBC: 4.43 Mil/uL (ref 3.87–5.11)
RDW: 13.2 % (ref 11.5–15.5)
WBC: 4.3 10*3/uL (ref 4.0–10.5)

## 2016-10-09 LAB — LIPID PANEL
CHOL/HDL RATIO: 5
Cholesterol: 226 mg/dL — ABNORMAL HIGH (ref 0–200)
HDL: 47.9 mg/dL (ref >39.00)
LDL Cholesterol: 153 mg/dL — ABNORMAL HIGH (ref 0–99)
NonHDL: 178.22
TRIGLYCERIDES: 126 mg/dL (ref 0.0–149.0)
VLDL: 25.2 mg/dL (ref 0.0–40.0)

## 2016-10-09 LAB — BASIC METABOLIC PANEL
BUN: 20 mg/dL (ref 6–23)
CALCIUM: 9.5 mg/dL (ref 8.4–10.5)
CO2: 27 meq/L (ref 19–32)
CREATININE: 0.9 mg/dL (ref 0.40–1.20)
Chloride: 110 mEq/L (ref 96–112)
GFR: 67.72 mL/min (ref >60.00)
Glucose, Bld: 102 mg/dL — ABNORMAL HIGH (ref 70–99)
Potassium: 4.4 mEq/L (ref 3.5–5.1)
Sodium: 143 mEq/L (ref 135–145)

## 2016-10-09 LAB — HEPATIC FUNCTION PANEL
ALBUMIN: 4.3 g/dL (ref 3.5–5.2)
ALK PHOS: 67 U/L (ref 39–117)
ALT: 17 U/L (ref 0–35)
AST: 14 U/L (ref 0–37)
Bilirubin, Direct: 0.1 mg/dL (ref 0.0–0.3)
TOTAL PROTEIN: 6.5 g/dL (ref 6.0–8.3)
Total Bilirubin: 0.6 mg/dL (ref 0.2–1.2)

## 2016-10-09 LAB — TSH: TSH: 4.97 u[IU]/mL — ABNORMAL HIGH (ref 0.35–4.50)

## 2016-10-10 ENCOUNTER — Ambulatory Visit
Admission: RE | Admit: 2016-10-10 | Discharge: 2016-10-10 | Disposition: A | Discharge status: Home / Self Care | Payer: BLUE CROSS/BLUE SHIELD | Source: Ambulatory Visit | Attending: Obstetrics & Gynecology | Admitting: Obstetrics & Gynecology

## 2016-10-10 DIAGNOSIS — Z86018 Personal history of other benign neoplasm: Secondary | ICD-10-CM | POA: Diagnosis not present

## 2016-10-10 DIAGNOSIS — D225 Melanocytic nevi of trunk: Secondary | ICD-10-CM | POA: Diagnosis not present

## 2016-10-10 DIAGNOSIS — Z1231 Encounter for screening mammogram for malignant neoplasm of breast: Secondary | ICD-10-CM | POA: Diagnosis not present

## 2016-10-10 DIAGNOSIS — L814 Other melanin hyperpigmentation: Secondary | ICD-10-CM | POA: Diagnosis not present

## 2016-10-10 DIAGNOSIS — D1801 Hemangioma of skin and subcutaneous tissue: Secondary | ICD-10-CM | POA: Diagnosis not present

## 2016-10-13 ENCOUNTER — Encounter: Payer: Self-pay | Admitting: Family Medicine

## 2016-10-13 ENCOUNTER — Ambulatory Visit (INDEPENDENT_AMBULATORY_CARE_PROVIDER_SITE_OTHER): Payer: BLUE CROSS/BLUE SHIELD | Admitting: Family Medicine

## 2016-10-13 VITALS — BP 130/70 | HR 60 | Temp 97.9°F | Ht 61.5 in | Wt 136.9 lb

## 2016-10-13 DIAGNOSIS — E785 Hyperlipidemia, unspecified: Secondary | ICD-10-CM | POA: Diagnosis not present

## 2016-10-13 DIAGNOSIS — Z0000 Encounter for general adult medical examination without abnormal findings: Secondary | ICD-10-CM

## 2016-10-13 DIAGNOSIS — R7989 Other specified abnormal findings of blood chemistry: Secondary | ICD-10-CM

## 2016-10-13 DIAGNOSIS — Z23 Encounter for immunization: Secondary | ICD-10-CM

## 2016-10-13 DIAGNOSIS — R946 Abnormal results of thyroid function studies: Secondary | ICD-10-CM | POA: Diagnosis not present

## 2016-10-13 NOTE — Patient Instructions (Signed)
Return in about 2 months for repeat thyroid functions.

## 2016-10-13 NOTE — Progress Notes (Signed)
Subjective:     Patient ID: Karen Salinas, female   DOB: Sep 27, 1955, 61 y.o.   MRN: [id number]  HPI  Patient seen for complete physical. She still sees dermatologist and gynecologist yearly. She has hyperlipidemia but no family history of premature CAD. She's had previous shingles vaccine this past fall. She is due for repeat tetanus. She has hypertension controlled with low-dose losartan.  Nonsmoker. No regular alcohol use. Exercises regularly. Has been frustrated with inability to lose weight recently  Past Medical History:  Diagnosis Date  . Hyperlipidemia   . IBS (irritable bowel syndrome)   . Kidney stones   . Low back pain   . Raynaud's disease   . Sinusitis    Past Surgical History:  Procedure Laterality Date  . ABDOMINAL HYSTERECTOMY    . BREAST CYST ASPIRATION    . COLONOSCOPY  2002  . CYSTOSCOPY WITH STENT PLACEMENT Right 12/16/2012   Procedure: RIGHT URETERAL STENT PLACEMENT;  Surgeon: Dutch Gray, MD;  Location: [facility];  Service: Urology;  Laterality: Right;  . CYSTOSCOPY/RETROGRADE/URETEROSCOPY Right 12/16/2012   Procedure: CYSTOSCOPY/RETROGRADE/URETEROSCOPY;  Surgeon: Dutch Gray, MD;  Location: [facility];  Service: Urology;  Laterality: Right;  . DILATION AND CURETTAGE OF UTERUS    . eye surgery for vision    . TUBAL LIGATION      reports that she has never smoked. She has never used smokeless tobacco. She reports that she drinks about 1.8 oz of alcohol per week . She reports that she does not use drugs. family history includes Hyperlipidemia in her father; Hypertension in her father and mother; Stroke in her brother. Allergies  Allergen Reactions  . Shellfish Allergy Nausea And Vomiting     Review of Systems  Constitutional: Negative for activity change, appetite change, fatigue, fever and unexpected weight change.  HENT: Negative for ear pain, hearing loss, sore throat and trouble swallowing.   Eyes: Negative for visual disturbance.  Respiratory: Negative for  cough and shortness of breath.   Cardiovascular: Negative for chest pain and palpitations.  Gastrointestinal: Negative for abdominal pain, blood in stool, constipation and diarrhea.  Genitourinary: Negative for dysuria and hematuria.  Musculoskeletal: Negative for arthralgias, back pain and myalgias.  Skin: Negative for rash.  Neurological: Negative for dizziness, syncope and headaches.  Hematological: Negative for adenopathy.  Psychiatric/Behavioral: Negative for confusion and dysphoric mood.       Objective:   Physical Exam  Constitutional: She is oriented to person, place, and time. She appears well-developed and well-nourished.  HENT:  Head: Normocephalic and atraumatic.  Eyes: EOM are normal. Pupils are equal, round, and reactive to light.  Neck: Normal range of motion. Neck supple. No thyromegaly present.  Cardiovascular: Normal rate, regular rhythm and normal heart sounds.   No murmur heard. Pulmonary/Chest: Breath sounds normal. No respiratory distress. She has no wheezes. She has no rales.  Abdominal: Soft. Bowel sounds are normal. She exhibits no distension and no mass. There is no tenderness. There is no rebound and no guarding.  Genitourinary:  Genitourinary Comments: Per gyn   Musculoskeletal: Normal range of motion. She exhibits no edema.  Lymphadenopathy:    She has no cervical adenopathy.  Neurological: She is alert and oriented to person, place, and time. She displays normal reflexes. No cranial nerve deficit.  Skin: No rash noted.  Psychiatric: She has a normal mood and affect. Her behavior is normal. Judgment and thought content normal.       Assessment:     Complete physical.  Generally healthy 61 year old female. She continues to see gynecologist yearly for Pap smears and mammograms.  Labs reviewed and she has minimally elevated TSH    Plan:     -Tetanus booster given -Continue with yearly flu vaccine -Consider new shingles vaccine in about 4-5  years -Continue current medication with losartan -Repeat TSH and free T4 in 2-3 months  Eulas Post MD [facility]

## 2016-11-02 ENCOUNTER — Encounter: Payer: Self-pay | Admitting: Family Medicine

## 2016-11-07 ENCOUNTER — Other Ambulatory Visit: Payer: Self-pay

## 2016-11-07 ENCOUNTER — Emergency Department (HOSPITAL_COMMUNITY): Payer: BLUE CROSS/BLUE SHIELD

## 2016-11-07 ENCOUNTER — Encounter (HOSPITAL_COMMUNITY): Payer: Self-pay | Admitting: Nurse Practitioner

## 2016-11-07 ENCOUNTER — Emergency Department (HOSPITAL_COMMUNITY)
Admission: EM | Admit: 2016-11-07 | Discharge: 2016-11-08 | Disposition: A | Discharge status: Home / Self Care | Payer: BLUE CROSS/BLUE SHIELD | Attending: Emergency Medicine | Admitting: Emergency Medicine

## 2016-11-07 DIAGNOSIS — K219 Gastro-esophageal reflux disease without esophagitis: Secondary | ICD-10-CM | POA: Insufficient documentation

## 2016-11-07 DIAGNOSIS — Z91013 Allergy to seafood: Secondary | ICD-10-CM | POA: Diagnosis not present

## 2016-11-07 DIAGNOSIS — I10 Essential (primary) hypertension: Secondary | ICD-10-CM

## 2016-11-07 DIAGNOSIS — R072 Precordial pain: Secondary | ICD-10-CM | POA: Diagnosis not present

## 2016-11-07 DIAGNOSIS — R079 Chest pain, unspecified: Secondary | ICD-10-CM | POA: Diagnosis not present

## 2016-11-07 LAB — BASIC METABOLIC PANEL
Anion gap: 11 (ref 5–15)
BUN: 17 mg/dL (ref 6–20)
CO2: 21 mmol/L — ABNORMAL LOW (ref 22–32)
CREATININE: 1 mg/dL (ref 0.44–1.00)
Calcium: 9.7 mg/dL (ref 8.9–10.3)
Chloride: 107 mmol/L (ref 101–111)
GFR calc Af Amer: >60 mL/min (ref >60)
GFR, EST NON AFRICAN AMERICAN: 60 mL/min — AB (ref >60)
GLUCOSE: 105 mg/dL — AB (ref 65–99)
POTASSIUM: 3.3 mmol/L — AB (ref 3.5–5.1)
Sodium: 139 mmol/L (ref 135–145)

## 2016-11-07 LAB — CBC
HEMATOCRIT: 41 % (ref 36.0–46.0)
Hemoglobin: 13.6 g/dL (ref 12.0–15.0)
MCH: 29 pg (ref 26.0–34.0)
MCHC: 33.2 g/dL (ref 30.0–36.0)
MCV: 87.4 fL (ref 78.0–100.0)
PLATELETS: 262 10*3/uL (ref 150–400)
RBC: 4.69 MIL/uL (ref 3.87–5.11)
RDW: 12.5 % (ref 11.5–15.5)
WBC: 7.1 10*3/uL (ref 4.0–10.5)

## 2016-11-07 LAB — I-STAT TROPONIN, ED: Troponin i, poc: 0 ng/mL (ref 0.00–0.08)

## 2016-11-07 MED ORDER — GI COCKTAIL ~~LOC~~
30.0000 mL | Freq: Once | ORAL | Status: AC
  Administered 2016-11-08: 30 mL via ORAL
  Filled 2016-11-07: qty 30

## 2016-11-07 NOTE — ED Triage Notes (Signed)
Pt presents with c/o CP. The pain began this afternoon while she was at rest. She describes the pain as constant midsternal pressure. She reports lightheadedness, elevated blood pressure, heartburn. She denies fevers, shortness of breath, cough, nausea, vomiting. She has been taking increased advil over the past several weeks due to a new workout routine.

## 2016-11-08 ENCOUNTER — Other Ambulatory Visit: Payer: Self-pay

## 2016-11-08 LAB — I-STAT TROPONIN, ED: TROPONIN I, POC: 0.01 ng/mL (ref 0.00–0.08)

## 2016-11-08 MED ORDER — OMEPRAZOLE 20 MG PO CPDR: 20.0000 mg | DELAYED_RELEASE_CAPSULE | Freq: Every day | ORAL | 0 refills | Status: DC

## 2016-11-08 NOTE — ED Provider Notes (Signed)
[facility] DEPT Provider Note   CSN: [id number] Arrival date & time: 11/07/16  1814     History   Chief Complaint Chief Complaint  Patient presents with  . Chest Pain    HPI Karen Salinas is a 61 y.o. female.  Karen Salinas is a 61 y.o. Female who presents to the emergency department complaining of chest pressure beginning this afternoon. Patient reports for the past several weeks she is had increased acid reflux and burping and belching. She reports taking ibuprofen for some knee pain. She reports today around 12:00 pm she began having some substernal chest pressure that is nonradiating. She denies shortness of breath. She also reports associated burping and belching. She also reports feeling anxious today. She denies history of MI, DVT or PE. She denies recent long travel or recent surgery. She does take low-dose estrogen patch after her hysterectomy more than 20 years ago. She denies fevers, coughing, shortness of breath, hemoptysis, leg pain, leg swelling, syncope, rashes, or abdominal pain.    The history is provided by the patient, medical records and a relative. No language interpreter was used.  Chest Pain   Pertinent negatives include no abdominal pain, no back pain, no cough, no dizziness, no fever, no headaches, no nausea, no numbness, no palpitations, no shortness of breath, no vomiting and no weakness.    Past Medical History:  Diagnosis Date  . Hyperlipidemia   . IBS (irritable bowel syndrome)   . Kidney stones   . Low back pain   . Sinusitis     Patient Active Problem List   Diagnosis Date Noted  . Essential hypertension 03/16/2015  . ELEVATED BLOOD PRESSURE WITHOUT DIAGNOSIS OF HYPERTENSION 11/19/2009  . Hyperlipidemia 06/04/2007    Past Surgical History:  Procedure Laterality Date  . ABDOMINAL HYSTERECTOMY    . BREAST CYST ASPIRATION    . COLONOSCOPY  2002  . CYSTOSCOPY WITH STENT PLACEMENT Right 12/16/2012   Procedure: RIGHT URETERAL STENT  PLACEMENT;  Surgeon: Dutch Gray, MD;  Location: [facility];  Service: Urology;  Laterality: Right;  . CYSTOSCOPY/RETROGRADE/URETEROSCOPY Right 12/16/2012   Procedure: CYSTOSCOPY/RETROGRADE/URETEROSCOPY;  Surgeon: Dutch Gray, MD;  Location: [facility];  Service: Urology;  Laterality: Right;  . DILATION AND CURETTAGE OF UTERUS    . eye surgery for vision    . TUBAL LIGATION      OB History    No data available       Home Medications    Prior to Admission medications   Medication Sig Start Date End Date Taking? Authorizing Provider  Calcium Carb-Cholecalciferol (CALCIUM + D3 PO) Take 1 tablet by mouth daily.    [provider]  estradiol (MINIVELLE) 0.05 MG/24HR Place 1 patch onto the skin. Two times per week Tues and Saturdays    [provider]  losartan (COZAAR) 25 MG tablet take 1 tablet by mouth once daily 05/26/16   Burchette, Alinda Sierras, MD  omeprazole (PRILOSEC) 20 MG capsule Take 1 capsule (20 mg total) by mouth daily. 11/08/16   Waynetta Pean, PA-C    Family History Family History  Problem Relation Age of Onset  . Hypertension Mother   . Hypertension Father   . Hyperlipidemia Father   . Stroke Brother     Social History Social History  Substance Use Topics  . Smoking status: Never Smoker  . Smokeless tobacco: Never Used  . Alcohol use 1.8 oz/week    3 Glasses of wine per week     Allergies  Shellfish allergy   Review of Systems Review of Systems  Constitutional: Negative for chills and fever.  HENT: Negative for congestion and sore throat.   Eyes: Negative for visual disturbance.  Respiratory: Negative for cough, shortness of breath and wheezing.   Cardiovascular: Positive for chest pain. Negative for palpitations and leg swelling.  Gastrointestinal: Negative for abdominal pain, diarrhea, nausea and vomiting.  Genitourinary: Negative for dysuria.  Musculoskeletal: Negative for back pain and neck pain.  Skin: Negative for rash.  Neurological:  Negative for dizziness, syncope, weakness, numbness and headaches.     Physical Exam Updated Vital Signs BP 138/72   Pulse 82   Temp 98.8 F (37.1 C) (Oral)   Resp 12   SpO2 99%   Physical Exam  Constitutional: She is oriented to person, place, and time. She appears well-developed and well-nourished. No distress.  Nontoxic appearing.  HENT:  Head: Normocephalic and atraumatic.  Mouth/Throat: Oropharynx is clear and moist.  Eyes: Conjunctivae are normal. Pupils are equal, round, and reactive to light. Right eye exhibits no discharge. Left eye exhibits no discharge.  Neck: Neck supple. No JVD present. No tracheal deviation present.  Cardiovascular: Normal rate, regular rhythm, normal heart sounds and intact distal pulses.  Exam reveals no gallop and no friction rub.   No murmur heard. Bilateral radial, posterior tibialis and dorsalis pedis pulses are intact.    Pulmonary/Chest: Effort normal and breath sounds normal. No stridor. No respiratory distress. She has no wheezes. She has no rales.  Lungs are clear to ascultation bilaterally. Symmetric chest expansion bilaterally. No increased work of breathing. No rales or rhonchi.    Abdominal: Soft. There is no tenderness. There is no guarding.  Musculoskeletal: She exhibits no edema or tenderness.  No lower extremity edema or tenderness.  Lymphadenopathy:    She has no cervical adenopathy.  Neurological: She is alert and oriented to person, place, and time. She exhibits normal muscle tone. Coordination normal.  Skin: Skin is warm and dry. Capillary refill takes less than 2 seconds. No rash noted. She is not diaphoretic. No erythema. No pallor.  Psychiatric: She has a normal mood and affect. Her behavior is normal.  Nursing note and vitals reviewed.    ED Treatments / Results  Labs (all labs ordered are listed, but only abnormal results are displayed) Labs Reviewed  BASIC METABOLIC PANEL - Abnormal; Notable for the following:        Result Value   Potassium 3.3 (*)    CO2 21 (*)    Glucose, Bld 105 (*)    GFR calc non Af Amer 60 (*)    All other components within normal limits  CBC  I-STAT TROPOININ, ED  I-STAT TROPOININ, ED    EKG  EKG Interpretation  Date/Time:  Wednesday November 08 2016 00:22:10 EDT Ventricular Rate:  70 PR Interval:    QRS Duration: 98 QT Interval:  377 QTC Calculation: 407 R Axis:   36 Text Interpretation:  Sinus rhythm Low voltage, precordial leads Borderline T wave abnormalities No acute changes No old tracing to compare no [id number] Confirmed by Varney Biles [phone]) on 11/08/2016 12:41:30 AM       Radiology Dg Chest 2 View  Result Date: 11/07/2016 CLINICAL DATA:  Pt c/o central chest pain and pressure, heartburn, light-headedness, and high blood pressure x 1 day. No hx of heart or lung problems. Pt is a nonsmoker. EXAM: CHEST  2 VIEW COMPARISON:  None. FINDINGS: Midline trachea. Normal heart size and mediastinal  contours. No pleural effusion or pneumothorax. Clear lungs. IMPRESSION: No acute cardiopulmonary disease. Electronically Signed   By: Abigail Miyamoto M.D.   On: 11/07/2016 19:05    Procedures Procedures (including critical care time)  Medications Ordered in ED Medications  gi cocktail (Maalox,Lidocaine,Donnatal) (30 mLs Oral Given 11/08/16 0019)     Initial Impression / Assessment and Plan / ED Course  I have reviewed the triage vital signs and the nursing notes.  Pertinent labs & imaging results that were available during my care of the patient were reviewed by me and considered in my medical decision making (see chart for details).    This is a 61 y.o. Female who presents to the emergency department complaining of chest pressure beginning this afternoon. Patient reports for the past several weeks she is had increased acid reflux and burping and belching. She reports taking ibuprofen for some knee pain. She reports today around 12:00 pm she began having some substernal  chest pressure that is nonradiating. She denies shortness of breath. She also reports associated burping and belching. She also reports feeling anxious today. She denies history of MI, DVT or PE. She denies recent long travel or recent surgery. She does take low-dose estrogen patch after her hysterectomy more than 20 years ago. She denies having any shortness of breath.  Patient presented with chest pain to the ED. Patient is to be discharged with recommendation to follow up with PCP and cardiology in regards to today's hospital visit. Chest pain is not likely of cardiac or pulmonary etiology due to presentation, VSS, no tracheal deviation, no JVD or new murmur, RRR, breath sounds equal bilaterally, EKG without acute abnormalities, negative troponin, and negative delta troponin, and negative CXR. She has no hypoxia, tachycardia or tachypnea on exam. At reevaluation following GI cocktail she reports her symptoms have resolved. Suspect GERD. Will start on omeprazole. Patient has been advised to return to the ED if chest pain becomes exertional, associated with diaphoresis or nausea, radiates to left jaw/arm, worsens or becomes concerning in any way. Patient appears reliable for follow up and is agreeable to discharge. I advised the patient to follow-up with their primary care provider this week. I advised the patient to return to the emergency department with new or worsening symptoms or new concerns. The patient verbalized understanding and agreement with plan.    This patient was discussed with and evaluated by Dr. Kathrynn Humble who agrees with assessment and plan.    Final Clinical Impressions(s) / ED Diagnoses   Final diagnoses:  Precordial pain  Essential hypertension  Gastroesophageal reflux disease, esophagitis presence not specified    New Prescriptions New Prescriptions   OMEPRAZOLE (PRILOSEC) 20 MG CAPSULE    Take 1 capsule (20 mg total) by mouth daily.     Waynetta Pean, PA-C 11/08/16  0101    Varney Biles, MD 11/08/16 [phone]

## 2016-11-10 ENCOUNTER — Telehealth: Payer: Self-pay | Admitting: *Deleted

## 2016-11-10 DIAGNOSIS — E785 Hyperlipidemia, unspecified: Secondary | ICD-10-CM

## 2016-11-10 NOTE — Telephone Encounter (Signed)
Call Documentation   Fay Records, MD at 11/10/2016 10:05 AM   Status: Signed    Spoke to pt yesterday   She was seen in ED for CP  I have reviewed with her  SOunds more GI in nature   Stopped NSAID  Taking omeprazole  No symptoms DIscussed lipids   With  FHx and high LDL in past I would recomm Ca score CT  Pt agrees to proceed   Wants to sched on July 16, 17 or 18     Order placed for Calcium Score CT scan.  Informed Williamson of available dates.

## 2016-11-10 NOTE — Telephone Encounter (Signed)
Spoke to pt yesterday   She was seen in ED for CP  I have reviewed with her  SOunds more GI in nature   Stopped NSAID  Taking omeprazole  No symptoms DIscussed lipids   With  FHx and high LDL in past I would recomm Ca score CT  Pt agrees to proceed   Wants to sched on July 16, 17 or 18

## 2016-12-04 ENCOUNTER — Ambulatory Visit (INDEPENDENT_AMBULATORY_CARE_PROVIDER_SITE_OTHER)
Admission: RE | Admit: 2016-12-04 | Discharge: 2016-12-04 | Disposition: A | Discharge status: Home / Self Care | Payer: BLUE CROSS/BLUE SHIELD | Source: Ambulatory Visit | Attending: Internal Medicine | Admitting: Internal Medicine

## 2016-12-04 ENCOUNTER — Inpatient Hospital Stay: Admission: RE | Admit: 2016-12-04 | Payer: BLUE CROSS/BLUE SHIELD | Source: Ambulatory Visit

## 2016-12-04 DIAGNOSIS — E785 Hyperlipidemia, unspecified: Secondary | ICD-10-CM

## 2016-12-08 ENCOUNTER — Encounter: Payer: Self-pay | Admitting: Internal Medicine

## 2016-12-13 ENCOUNTER — Encounter: Payer: Self-pay | Admitting: Family Medicine

## 2016-12-13 ENCOUNTER — Encounter: Payer: Self-pay | Admitting: Internal Medicine

## 2016-12-13 ENCOUNTER — Other Ambulatory Visit (INDEPENDENT_AMBULATORY_CARE_PROVIDER_SITE_OTHER): Payer: BLUE CROSS/BLUE SHIELD

## 2016-12-13 DIAGNOSIS — E785 Hyperlipidemia, unspecified: Secondary | ICD-10-CM | POA: Diagnosis not present

## 2016-12-13 DIAGNOSIS — R946 Abnormal results of thyroid function studies: Secondary | ICD-10-CM | POA: Diagnosis not present

## 2016-12-13 DIAGNOSIS — R7989 Other specified abnormal findings of blood chemistry: Secondary | ICD-10-CM

## 2016-12-13 LAB — TSH: TSH: 4.71 u[IU]/mL — AB (ref 0.35–4.50)

## 2016-12-13 LAB — T4, FREE: Free T4: 0.78 ng/dL (ref 0.60–1.60)

## 2016-12-13 LAB — LIPID PANEL
CHOLESTEROL: 205 mg/dL — AB (ref 0–200)
HDL: 46.8 mg/dL (ref >39.00)
LDL Cholesterol: 133 mg/dL — ABNORMAL HIGH (ref 0–99)
NonHDL: 157.71
TRIGLYCERIDES: 125 mg/dL (ref 0.0–149.0)
Total CHOL/HDL Ratio: 4
VLDL: 25 mg/dL (ref 0.0–40.0)

## 2016-12-14 ENCOUNTER — Encounter: Payer: Self-pay | Admitting: Internal Medicine

## 2016-12-14 ENCOUNTER — Other Ambulatory Visit: Payer: Self-pay | Admitting: *Deleted

## 2016-12-14 DIAGNOSIS — I2510 Atherosclerotic heart disease of native coronary artery without angina pectoris: Secondary | ICD-10-CM

## 2016-12-14 MED ORDER — ROSUVASTATIN CALCIUM 20 MG PO TABS: 20.0000 mg | ORAL_TABLET | Freq: Every day | ORAL | 3 refills | Status: DC

## 2016-12-14 NOTE — Progress Notes (Signed)
I Notes recorded by Fay Records, MD on 12/11/2016 at 12:42 PM EDT I sent an email re test results  Aorta had mild plaquing And also mild dilation of ascending aorta I would recomm she start a statin Crestor 20 mg  F/U lipids in 8 wks with AST  Told her to call me when back from [place] to review   Spoke with patient.  Sent prescription for rosuvastatin to [facility].  Appointment made for repeat lab work on 02/05/17.  She verbalizes understanding and agreement with taking medication.

## 2016-12-21 ENCOUNTER — Encounter: Payer: Self-pay | Admitting: Internal Medicine

## 2017-01-15 ENCOUNTER — Ambulatory Visit (INDEPENDENT_AMBULATORY_CARE_PROVIDER_SITE_OTHER): Payer: BLUE CROSS/BLUE SHIELD | Admitting: Internal Medicine

## 2017-01-15 ENCOUNTER — Other Ambulatory Visit: Payer: Self-pay | Admitting: Family Medicine

## 2017-01-15 ENCOUNTER — Encounter: Payer: Self-pay | Admitting: Internal Medicine

## 2017-01-15 VITALS — BP 136/80 | HR 107 | Ht 62.0 in | Wt 131.0 lb

## 2017-01-15 DIAGNOSIS — E785 Hyperlipidemia, unspecified: Secondary | ICD-10-CM | POA: Diagnosis not present

## 2017-01-15 NOTE — Progress Notes (Signed)
Cardiology Office Note   Date:  01/15/2017   ID:  Karen Salinas, Karen Salinas 18-May-1956, MRN [id number]  PCP:  Eulas Post, MD  Cardiologist:   Dorris Carnes, MD   Pt presents for eval of Lipids      History of Present Illness: Karen Salinas is a 61 y.o. female with a history of hyperlipidemia  I have not seen the pt before    She was seen in ED for CP back in June 2018  Described as chest pressure  Several weeks noted increased acid reflux with belching  Day of visit worse  Anxious   Spoke to pt on phone  Felt probably GI    REcomm CT calcium score   This was O.  She did have CT scan in past of abdomen which showed mild plaquing of aorta.   REcomm  Trial fo crestor Had increased chest tightness  I told her to stop  Chest tightness resolved  She said she read that it could be a potential SE     LDL in may was 153  In July LDL was 133    Active  Walks 3 to 4 miles per day  Feels she is in good shape   Current Meds  Medication Sig  . Calcium Carb-Cholecalciferol (CALCIUM + D3 PO) Take 1 tablet by mouth daily.  Marland Kitchen estradiol (MINIVELLE) 0.05 MG/24HR Place 1 patch onto the skin. Two times per week Tues and Saturdays  . losartan (COZAAR) 25 MG tablet take 1 tablet by mouth once daily     Allergies:   Shellfish allergy   Past Medical History:  Diagnosis Date  . Hyperlipidemia   . IBS (irritable bowel syndrome)   . Kidney stones   . Low back pain   . Sinusitis     Past Surgical History:  Procedure Laterality Date  . ABDOMINAL HYSTERECTOMY    . BREAST CYST ASPIRATION    . COLONOSCOPY  2002  . CYSTOSCOPY WITH STENT PLACEMENT Right 12/16/2012   Procedure: RIGHT URETERAL STENT PLACEMENT;  Surgeon: Dutch Gray, MD;  Location: [facility];  Service: Urology;  Laterality: Right;  . CYSTOSCOPY/RETROGRADE/URETEROSCOPY Right 12/16/2012   Procedure: CYSTOSCOPY/RETROGRADE/URETEROSCOPY;  Surgeon: Dutch Gray, MD;  Location: [facility];  Service: Urology;  Laterality: Right;  . DILATION AND CURETTAGE  OF UTERUS    . eye surgery for vision    . TUBAL LIGATION       Social History:  The patient  reports that she has never smoked. She has never used smokeless tobacco. She reports that she drinks about 1.8 oz of alcohol per week . She reports that she does not use drugs.   Family History:  The patient's family history includes Hyperlipidemia in her father; Hypertension in her father and mother; Stroke in her brother.    ROS:  Please see the history of present illness. All other systems are reviewed and  Negative to the above problem except as noted.    PHYSICAL EXAM: VS:  BP 136/80   Pulse (!) 107   Ht 5\' 2"  (1.575 m)   Wt 131 lb (59.4 kg)   SpO2 98%   BMI 23.96 kg/m   GEN: Well nourished, well developed, in no acute distress  HEENT: normal  Neck: no JVD, carotid bruits, or masses Cardiac: RRR; no murmurs, rubs, or gallops,no edema  Respiratory:  clear to auscultation bilaterally, normal work of breathing GI: soft, nontender, nondistended, + BS  No hepatomegaly  MS: no  deformity Moving all extremities   Skin: warm and dry, no rash Neuro:  Strength and sensation are intact Psych: euthymic mood, full affect   EKG:  EKG is ordered today.  From June SR 84 bpm     Lipid Panel    Component Value Date/Time   CHOL 205 (H) 12/13/2016 0820   TRIG 125.0 12/13/2016 0820   HDL 46.80 12/13/2016 0820   CHOLHDL 4 12/13/2016 0820   VLDL 25.0 12/13/2016 0820   LDLCALC 133 (H) 12/13/2016 0820   LDLDIRECT 155.9 06/12/2013 0837      Wt Readings from Last 3 Encounters:  01/15/17 131 lb (59.4 kg)  10/13/16 136 lb 14.4 oz (62.1 kg)  10/04/15 128 lb (58.1 kg)      ASSESSMENT AND PLAN:  1  Chest pain  Probable GI in orign    2 Hyperlipidemia  Improved some with diet  Pt would like to continue to work with diet to see how LDL can be lowered  Coronary calcium score is 0  Abdominal aorta on CT 2 years ago with minimal calcification. I think this is reasonable  Will check in  December. Reassured pt about CT findings  Ovreall very good.  3  Aorta  41 mm on CT   Will f/u in future     Current medicines are reviewed at length with the patient today.  The patient does not have concerns regarding medicines.  Signed, Dorris Carnes, MD  01/15/2017 3:35 PM    [facility] [address], [place], [place]  [address] Phone: [phone]; Fax: [phone]

## 2017-01-15 NOTE — Patient Instructions (Signed)
Your physician recommends that you continue on your current medications as directed. Please refer to the Current Medication list given to you today. Your physician recommends that you return for lab work in: December.

## 2017-01-23 DIAGNOSIS — Z6823 Body mass index (BMI) 23.0-23.9, adult: Secondary | ICD-10-CM | POA: Diagnosis not present

## 2017-01-23 DIAGNOSIS — Z01419 Encounter for gynecological examination (general) (routine) without abnormal findings: Secondary | ICD-10-CM | POA: Diagnosis not present

## 2017-02-05 ENCOUNTER — Other Ambulatory Visit: Payer: BLUE CROSS/BLUE SHIELD

## 2017-02-08 ENCOUNTER — Encounter: Payer: Self-pay | Admitting: Family Medicine

## 2017-04-18 ENCOUNTER — Other Ambulatory Visit: Payer: BLUE CROSS/BLUE SHIELD

## 2017-04-30 ENCOUNTER — Other Ambulatory Visit: Payer: BLUE CROSS/BLUE SHIELD

## 2017-05-02 ENCOUNTER — Other Ambulatory Visit: Payer: BLUE CROSS/BLUE SHIELD | Admitting: *Deleted

## 2017-05-02 DIAGNOSIS — I2510 Atherosclerotic heart disease of native coronary artery without angina pectoris: Secondary | ICD-10-CM

## 2017-05-02 DIAGNOSIS — E785 Hyperlipidemia, unspecified: Secondary | ICD-10-CM | POA: Diagnosis not present

## 2017-05-02 LAB — LIPID PANEL
CHOLESTEROL TOTAL: 193 mg/dL (ref 100–199)
Chol/HDL Ratio: 3.9 ratio (ref 0.0–4.4)
HDL: 49 mg/dL (ref >39)
LDL Calculated: 127 mg/dL — ABNORMAL HIGH (ref 0–99)
Triglycerides: 83 mg/dL (ref 0–149)
VLDL Cholesterol Cal: 17 mg/dL (ref 5–40)

## 2017-05-02 LAB — AST: AST: 12 IU/L (ref 0–40)

## 2017-05-03 ENCOUNTER — Encounter: Payer: Self-pay | Admitting: Internal Medicine

## 2017-05-07 ENCOUNTER — Encounter: Payer: Self-pay | Admitting: Internal Medicine

## 2017-07-02 ENCOUNTER — Other Ambulatory Visit: Payer: Self-pay | Admitting: Obstetrics & Gynecology

## 2017-07-02 DIAGNOSIS — Z1231 Encounter for screening mammogram for malignant neoplasm of breast: Secondary | ICD-10-CM

## 2017-07-06 ENCOUNTER — Other Ambulatory Visit: Payer: Self-pay | Admitting: Family Medicine

## 2017-07-15 DIAGNOSIS — E785 Hyperlipidemia, unspecified: Secondary | ICD-10-CM | POA: Diagnosis not present

## 2017-07-15 DIAGNOSIS — R072 Precordial pain: Secondary | ICD-10-CM | POA: Diagnosis not present

## 2017-07-15 DIAGNOSIS — Z87442 Personal history of urinary calculi: Secondary | ICD-10-CM | POA: Diagnosis not present

## 2017-07-15 DIAGNOSIS — I10 Essential (primary) hypertension: Secondary | ICD-10-CM | POA: Diagnosis not present

## 2017-07-15 DIAGNOSIS — Z8249 Family history of ischemic heart disease and other diseases of the circulatory system: Secondary | ICD-10-CM | POA: Diagnosis not present

## 2017-07-15 DIAGNOSIS — Z90710 Acquired absence of both cervix and uterus: Secondary | ICD-10-CM | POA: Diagnosis not present

## 2017-07-15 DIAGNOSIS — R079 Chest pain, unspecified: Secondary | ICD-10-CM | POA: Diagnosis not present

## 2017-07-15 DIAGNOSIS — Z9889 Other specified postprocedural states: Secondary | ICD-10-CM | POA: Diagnosis not present

## 2017-07-15 DIAGNOSIS — Z8679 Personal history of other diseases of the circulatory system: Secondary | ICD-10-CM | POA: Diagnosis not present

## 2017-09-20 ENCOUNTER — Telehealth: Payer: Self-pay | Admitting: Internal Medicine

## 2017-09-20 DIAGNOSIS — I77810 Thoracic aortic ectasia: Secondary | ICD-10-CM

## 2017-09-20 DIAGNOSIS — E785 Hyperlipidemia, unspecified: Secondary | ICD-10-CM

## 2017-09-20 NOTE — Telephone Encounter (Signed)
New Message:    Pt is scheduled for her annual c

## 2017-09-20 NOTE — Telephone Encounter (Signed)
Placed orders for MRA of aorta/bmet.  Also due for fasting lipids. She lives out of town [facility]) most of the time.  Will be around 5/24 through 5/31 and so is hoping to make appointments for during that time.

## 2017-09-20 NOTE — Telephone Encounter (Signed)
New Message:    Pt is scheduled for her yearly check up on 12-31-17. She said she thought she were supposed to have a test before she saw Dr Harrington Challenger. She could not remember the name of the test,something about her Aorta.Marland Kitchen

## 2017-09-27 ENCOUNTER — Telehealth: Payer: Self-pay | Admitting: Internal Medicine

## 2017-09-27 NOTE — Telephone Encounter (Signed)
New Message    Patient is calling because she has not heard anything about have the MRA of aorta scheduled. Please call to discuss.

## 2017-10-03 ENCOUNTER — Telehealth: Payer: Self-pay | Admitting: Internal Medicine

## 2017-10-03 ENCOUNTER — Encounter: Payer: Self-pay | Admitting: Internal Medicine

## 2017-10-03 NOTE — Telephone Encounter (Signed)
Called patient and gave her the date, time and location of cardiac MRI.  I let the patient know that she can go 45 minutes before her test to get her lab work completed.  Letter mailed to the patient today.

## 2017-10-04 NOTE — Telephone Encounter (Signed)
Cardiac CT scheduled for 10/16/17.

## 2017-10-16 ENCOUNTER — Ambulatory Visit: Payer: BLUE CROSS/BLUE SHIELD

## 2017-10-16 ENCOUNTER — Ambulatory Visit
Admission: RE | Admit: 2017-10-16 | Discharge: 2017-10-16 | Disposition: A | Discharge status: Home / Self Care | Payer: BLUE CROSS/BLUE SHIELD | Source: Ambulatory Visit | Attending: Obstetrics & Gynecology | Admitting: Obstetrics & Gynecology

## 2017-10-16 ENCOUNTER — Other Ambulatory Visit: Payer: BLUE CROSS/BLUE SHIELD | Admitting: *Deleted

## 2017-10-16 ENCOUNTER — Ambulatory Visit (HOSPITAL_COMMUNITY)
Admission: RE | Admit: 2017-10-16 | Discharge: 2017-10-16 | Disposition: A | Discharge status: Home / Self Care | Payer: BLUE CROSS/BLUE SHIELD | Source: Ambulatory Visit | Attending: Internal Medicine | Admitting: Internal Medicine

## 2017-10-16 DIAGNOSIS — I2510 Atherosclerotic heart disease of native coronary artery without angina pectoris: Secondary | ICD-10-CM

## 2017-10-16 DIAGNOSIS — I77810 Thoracic aortic ectasia: Secondary | ICD-10-CM

## 2017-10-16 DIAGNOSIS — E785 Hyperlipidemia, unspecified: Secondary | ICD-10-CM

## 2017-10-16 DIAGNOSIS — Z1231 Encounter for screening mammogram for malignant neoplasm of breast: Secondary | ICD-10-CM | POA: Diagnosis not present

## 2017-10-16 DIAGNOSIS — S91311A Laceration without foreign body, right foot, initial encounter: Secondary | ICD-10-CM | POA: Diagnosis not present

## 2017-10-16 LAB — BASIC METABOLIC PANEL
BUN/Creatinine Ratio: 19 (ref 12–28)
BUN: 19 mg/dL (ref 8–27)
CALCIUM: 9.3 mg/dL (ref 8.7–10.3)
CHLORIDE: 107 mmol/L — AB (ref 96–106)
CO2: 21 mmol/L (ref 20–29)
CREATININE: 0.99 mg/dL (ref 0.57–1.00)
GFR calc Af Amer: 71 mL/min/{1.73_m2} (ref >59)
GFR calc non Af Amer: 62 mL/min/{1.73_m2} (ref >59)
GLUCOSE: 97 mg/dL (ref 65–99)
Potassium: 4.3 mmol/L (ref 3.5–5.2)
Sodium: 141 mmol/L (ref 134–144)

## 2017-10-16 LAB — CREATININE, SERUM
CREATININE: 1.04 mg/dL — AB (ref 0.44–1.00)
GFR calc Af Amer: >60 mL/min (ref >60)
GFR, EST NON AFRICAN AMERICAN: 57 mL/min — AB (ref >60)

## 2017-10-16 LAB — LIPID PANEL
CHOL/HDL RATIO: 4.1 ratio (ref 0.0–4.4)
Cholesterol, Total: 197 mg/dL (ref 100–199)
HDL: 48 mg/dL (ref >39)
LDL Calculated: 131 mg/dL — ABNORMAL HIGH (ref 0–99)
Triglycerides: 88 mg/dL (ref 0–149)
VLDL Cholesterol Cal: 18 mg/dL (ref 5–40)

## 2017-10-16 MED ORDER — GADOBENATE DIMEGLUMINE 529 MG/ML IV SOLN
13.0000 mL | Freq: Once | INTRAVENOUS | Status: AC | PRN
  Administered 2017-10-16: 13 mL via INTRAVENOUS

## 2017-10-17 ENCOUNTER — Encounter: Payer: Self-pay | Admitting: Family Medicine

## 2017-10-17 ENCOUNTER — Ambulatory Visit (INDEPENDENT_AMBULATORY_CARE_PROVIDER_SITE_OTHER): Payer: BLUE CROSS/BLUE SHIELD | Admitting: Family Medicine

## 2017-10-17 VITALS — BP 102/70 | HR 67 | Temp 98.0°F | Ht 61.5 in | Wt 127.3 lb

## 2017-10-17 DIAGNOSIS — Z23 Encounter for immunization: Secondary | ICD-10-CM | POA: Diagnosis not present

## 2017-10-17 DIAGNOSIS — Z86018 Personal history of other benign neoplasm: Secondary | ICD-10-CM | POA: Diagnosis not present

## 2017-10-17 DIAGNOSIS — D225 Melanocytic nevi of trunk: Secondary | ICD-10-CM | POA: Diagnosis not present

## 2017-10-17 DIAGNOSIS — D1801 Hemangioma of skin and subcutaneous tissue: Secondary | ICD-10-CM | POA: Diagnosis not present

## 2017-10-17 DIAGNOSIS — L814 Other melanin hyperpigmentation: Secondary | ICD-10-CM | POA: Diagnosis not present

## 2017-10-17 DIAGNOSIS — Z0000 Encounter for general adult medical examination without abnormal findings: Secondary | ICD-10-CM

## 2017-10-17 LAB — CBC WITH DIFFERENTIAL/PLATELET
BASOS PCT: 0.8 % (ref 0.0–3.0)
Basophils Absolute: 0 10*3/uL (ref 0.0–0.1)
Eosinophils Absolute: 0 10*3/uL (ref 0.0–0.7)
Eosinophils Relative: 0.6 % (ref 0.0–5.0)
HCT: 39.9 % (ref 36.0–46.0)
HEMOGLOBIN: 13.6 g/dL (ref 12.0–15.0)
LYMPHS ABS: 1.6 10*3/uL (ref 0.7–4.0)
Lymphocytes Relative: 41.4 % (ref 12.0–46.0)
MCHC: 33.9 g/dL (ref 30.0–36.0)
MCV: 92 fl (ref 78.0–100.0)
MONO ABS: 0.3 10*3/uL (ref 0.1–1.0)
Monocytes Relative: 6.9 % (ref 3.0–12.0)
NEUTROS ABS: 2 10*3/uL (ref 1.4–7.7)
Neutrophils Relative %: 50.3 % (ref 43.0–77.0)
PLATELETS: 249 10*3/uL (ref 150.0–400.0)
RBC: 4.34 Mil/uL (ref 3.87–5.11)
RDW: 12.9 % (ref 11.5–15.5)
WBC: 4 10*3/uL (ref 4.0–10.5)

## 2017-10-17 LAB — T4, FREE: Free T4: 0.78 ng/dL (ref 0.60–1.60)

## 2017-10-17 LAB — TSH: TSH: 2.43 u[IU]/mL (ref 0.35–4.50)

## 2017-10-17 NOTE — Progress Notes (Signed)
Subjective:     Patient ID: Karen Salinas, female   DOB: 03/30/1956, 62 y.o.   MRN: [id number]  HPI Patient here for physical exam. She has hypertension treated with low-dose losartan. She's been very active and has lost 8 pounds this past year. She feels good overall. She does have past history of mildly elevated TSH with normal free T4.  She's had previous shingles vaccine and would like to consider new shingles vaccine. Other immunizations up-to-date.  She had mammogram yesterday which was normal. She had MR angiogram of the chest to evaluate minimally dilated ascending thoracic aorta and this was stable compared to 2018. No recent chest pains. No dyspnea.  She has mild elevated lipids but 10 year risk of CAD only 3.4%.  Past Medical History:  Diagnosis Date  . Hyperlipidemia   . IBS (irritable bowel syndrome)   . Kidney stones   . Low back pain   . Sinusitis    Past Surgical History:  Procedure Laterality Date  . ABDOMINAL HYSTERECTOMY    . BREAST CYST ASPIRATION    . COLONOSCOPY  2002  . CYSTOSCOPY WITH STENT PLACEMENT Right 12/16/2012   Procedure: RIGHT URETERAL STENT PLACEMENT;  Surgeon: Dutch Gray, MD;  Location: [facility];  Service: Urology;  Laterality: Right;  . CYSTOSCOPY/RETROGRADE/URETEROSCOPY Right 12/16/2012   Procedure: CYSTOSCOPY/RETROGRADE/URETEROSCOPY;  Surgeon: Dutch Gray, MD;  Location: [facility];  Service: Urology;  Laterality: Right;  . DILATION AND CURETTAGE OF UTERUS    . eye surgery for vision    . TUBAL LIGATION      reports that she has never smoked. She has never used smokeless tobacco. She reports that she drinks about 1.8 oz of alcohol per week. She reports that she does not use drugs. family history includes Hyperlipidemia in her father; Hypertension in her father and mother; Stroke in her brother. Allergies  Allergen Reactions  . Shellfish Allergy Nausea And Vomiting     Review of Systems  Constitutional: Negative for activity change, appetite  change, fatigue, fever and unexpected weight change.  HENT: Negative for ear pain, hearing loss, sore throat and trouble swallowing.   Eyes: Negative for visual disturbance.  Respiratory: Negative for cough and shortness of breath.   Cardiovascular: Negative for chest pain and palpitations.  Gastrointestinal: Negative for abdominal pain, blood in stool, constipation and diarrhea.  Genitourinary: Negative for dysuria and hematuria.  Musculoskeletal: Negative for arthralgias, back pain and myalgias.  Skin: Negative for rash.  Neurological: Negative for dizziness, syncope and headaches.  Hematological: Negative for adenopathy.  Psychiatric/Behavioral: Negative for confusion and dysphoric mood.       Objective:   Physical Exam  Constitutional: She is oriented to person, place, and time. She appears well-developed and well-nourished.  HENT:  Head: Normocephalic and atraumatic.  Eyes: Pupils are equal, round, and reactive to light. EOM are normal.  Neck: Normal range of motion. Neck supple. No thyromegaly present.  Cardiovascular: Normal rate, regular rhythm and normal heart sounds.  No murmur heard. Pulmonary/Chest: Breath sounds normal. No respiratory distress. She has no wheezes. She has no rales.  Abdominal: Soft. Bowel sounds are normal. She exhibits no distension and no mass. There is no tenderness. There is no rebound and no guarding.  Musculoskeletal: Normal range of motion. She exhibits no edema.  Lymphadenopathy:    She has no cervical adenopathy.  Neurological: She is alert and oriented to person, place, and time. She displays normal reflexes. No cranial nerve deficit.  Skin: No rash noted.  Psychiatric: She has a normal mood and affect. Her behavior is normal. Judgment and thought content normal.       Assessment:     Physical exam. Patient has history of mildly elevated TSH with normal free to T4. Suspect subclinical hypothyroidism. Following issues were discussed     Plan:     -Patient requesting new shingles vaccine and this was given and she will get booster in 2-6 months -Recheck labs with CBC, TSH, free T4 -Lipids and basic metabolic panel were not checked since these were done yesterday -Continue with yearly flu vaccine  Eulas Post MD [facility]

## 2017-10-18 ENCOUNTER — Encounter: Payer: Self-pay | Admitting: Internal Medicine

## 2017-10-18 NOTE — Progress Notes (Signed)
Pre test for imaging of aorta.

## 2017-10-22 NOTE — Telephone Encounter (Signed)
Pt does not need f/u in Aug.   WIll get MRI in 1 1/2 year

## 2017-10-23 ENCOUNTER — Encounter: Payer: Self-pay | Admitting: Internal Medicine

## 2017-10-24 NOTE — Telephone Encounter (Signed)
Schedule pt to have cholesterol checked in 6 months

## 2017-10-25 ENCOUNTER — Other Ambulatory Visit: Payer: Self-pay | Admitting: *Deleted

## 2017-10-25 DIAGNOSIS — E785 Hyperlipidemia, unspecified: Secondary | ICD-10-CM

## 2017-11-07 DIAGNOSIS — R202 Paresthesia of skin: Secondary | ICD-10-CM | POA: Diagnosis not present

## 2017-11-07 DIAGNOSIS — G5621 Lesion of ulnar nerve, right upper limb: Secondary | ICD-10-CM | POA: Diagnosis not present

## 2017-11-08 DIAGNOSIS — R200 Anesthesia of skin: Secondary | ICD-10-CM | POA: Diagnosis not present

## 2017-11-14 DIAGNOSIS — R202 Paresthesia of skin: Secondary | ICD-10-CM | POA: Diagnosis not present

## 2017-12-25 ENCOUNTER — Other Ambulatory Visit: Payer: Self-pay | Admitting: *Deleted

## 2017-12-25 ENCOUNTER — Other Ambulatory Visit (INDEPENDENT_AMBULATORY_CARE_PROVIDER_SITE_OTHER): Payer: BLUE CROSS/BLUE SHIELD

## 2017-12-25 ENCOUNTER — Encounter: Payer: Self-pay | Admitting: *Deleted

## 2017-12-25 DIAGNOSIS — Z1211 Encounter for screening for malignant neoplasm of colon: Secondary | ICD-10-CM | POA: Diagnosis not present

## 2017-12-25 LAB — POC HEMOCCULT BLD/STL (HOME/3-CARD/SCREEN)
FECAL OCCULT BLD: NEGATIVE
FECAL OCCULT BLD: NEGATIVE
FECAL OCCULT BLD: NEGATIVE

## 2017-12-27 ENCOUNTER — Other Ambulatory Visit: Payer: Self-pay | Admitting: Family Medicine

## 2017-12-31 ENCOUNTER — Ambulatory Visit: Payer: BLUE CROSS/BLUE SHIELD | Admitting: Internal Medicine

## 2018-01-28 ENCOUNTER — Ambulatory Visit: Payer: BLUE CROSS/BLUE SHIELD

## 2018-01-30 DIAGNOSIS — Z6823 Body mass index (BMI) 23.0-23.9, adult: Secondary | ICD-10-CM | POA: Diagnosis not present

## 2018-01-30 DIAGNOSIS — Z01419 Encounter for gynecological examination (general) (routine) without abnormal findings: Secondary | ICD-10-CM | POA: Diagnosis not present

## 2018-04-22 ENCOUNTER — Other Ambulatory Visit: Payer: BLUE CROSS/BLUE SHIELD | Admitting: *Deleted

## 2018-04-22 DIAGNOSIS — E785 Hyperlipidemia, unspecified: Secondary | ICD-10-CM | POA: Diagnosis not present

## 2018-04-22 LAB — LIPID PANEL
CHOL/HDL RATIO: 4.1 ratio (ref 0.0–4.4)
CHOLESTEROL TOTAL: 208 mg/dL — AB (ref 100–199)
HDL: 51 mg/dL (ref >39)
LDL Calculated: 136 mg/dL — ABNORMAL HIGH (ref 0–99)
TRIGLYCERIDES: 107 mg/dL (ref 0–149)
VLDL Cholesterol Cal: 21 mg/dL (ref 5–40)

## 2018-06-03 DIAGNOSIS — M722 Plantar fascial fibromatosis: Secondary | ICD-10-CM | POA: Diagnosis not present

## 2018-06-03 DIAGNOSIS — M79671 Pain in right foot: Secondary | ICD-10-CM | POA: Diagnosis not present

## 2018-06-18 ENCOUNTER — Other Ambulatory Visit: Payer: Self-pay | Admitting: Family Medicine

## 2018-06-18 DIAGNOSIS — M722 Plantar fascial fibromatosis: Secondary | ICD-10-CM | POA: Diagnosis not present

## 2018-06-20 DIAGNOSIS — M722 Plantar fascial fibromatosis: Secondary | ICD-10-CM | POA: Diagnosis not present

## 2018-06-25 DIAGNOSIS — M722 Plantar fascial fibromatosis: Secondary | ICD-10-CM | POA: Diagnosis not present

## 2018-06-27 DIAGNOSIS — M722 Plantar fascial fibromatosis: Secondary | ICD-10-CM | POA: Diagnosis not present

## 2018-07-09 DIAGNOSIS — M722 Plantar fascial fibromatosis: Secondary | ICD-10-CM | POA: Diagnosis not present

## 2018-07-11 DIAGNOSIS — M722 Plantar fascial fibromatosis: Secondary | ICD-10-CM | POA: Diagnosis not present

## 2018-07-16 DIAGNOSIS — M722 Plantar fascial fibromatosis: Secondary | ICD-10-CM | POA: Diagnosis not present

## 2018-07-18 DIAGNOSIS — M722 Plantar fascial fibromatosis: Secondary | ICD-10-CM | POA: Diagnosis not present

## 2018-07-24 ENCOUNTER — Other Ambulatory Visit: Payer: Self-pay | Admitting: Obstetrics & Gynecology

## 2018-07-24 DIAGNOSIS — Z1231 Encounter for screening mammogram for malignant neoplasm of breast: Secondary | ICD-10-CM

## 2018-07-30 DIAGNOSIS — M722 Plantar fascial fibromatosis: Secondary | ICD-10-CM | POA: Diagnosis not present

## 2018-10-28 ENCOUNTER — Other Ambulatory Visit: Payer: Self-pay

## 2018-10-28 ENCOUNTER — Encounter: Payer: Self-pay | Admitting: Sports Medicine

## 2018-10-28 ENCOUNTER — Ambulatory Visit: Payer: Self-pay

## 2018-10-28 ENCOUNTER — Ambulatory Visit: Payer: BC Managed Care – PPO | Admitting: Sports Medicine

## 2018-10-28 VITALS — BP 120/70 | Ht 62.0 in | Wt 126.0 lb

## 2018-10-28 DIAGNOSIS — D225 Melanocytic nevi of trunk: Secondary | ICD-10-CM | POA: Diagnosis not present

## 2018-10-28 DIAGNOSIS — Z86018 Personal history of other benign neoplasm: Secondary | ICD-10-CM | POA: Diagnosis not present

## 2018-10-28 DIAGNOSIS — L821 Other seborrheic keratosis: Secondary | ICD-10-CM | POA: Diagnosis not present

## 2018-10-28 DIAGNOSIS — M79671 Pain in right foot: Secondary | ICD-10-CM | POA: Diagnosis not present

## 2018-10-28 DIAGNOSIS — M722 Plantar fascial fibromatosis: Secondary | ICD-10-CM | POA: Diagnosis not present

## 2018-10-28 DIAGNOSIS — L814 Other melanin hyperpigmentation: Secondary | ICD-10-CM | POA: Diagnosis not present

## 2018-10-28 DIAGNOSIS — D485 Neoplasm of uncertain behavior of skin: Secondary | ICD-10-CM | POA: Diagnosis not present

## 2018-10-28 NOTE — Progress Notes (Signed)
   Subjective:    Patient ID: KALIA VAHEY, female    DOB: 03/01/1956, 63 y.o.   MRN: [id number]  HPI chief complaint: Right heel pain  Very pleasant 63 year old female comes in today complaining of right heel pain is been present since July of last year.  Pain began as she was doing a walk/run exercise routine daily.  She denies any specific trauma but does describe a sudden onset of pain around that time.  In fact, she had pain in both feet but her left heel pain resolved.  She bought new sneakers and that did help some.  She was living in [place] at the time and sought treatment at [place].  She was diagnosed with Achilles tendinitis and referred to physical therapy.  She received some dry needling and physical therapy which was helpful but she was only able to get limited visits due to the onset of COVID-19.  She has since moved to [place].  She localizes her pain to the plantar aspect of the right heel but also endorses pain along the lateral aspect of the heel as well.  She is not noticed any swelling.  She has purchased a good supportive pair of sandals which are comfortable.  She has also tried an orthotic which was given to her at a shoe store but she finds it to be uncomfortable in her walking shoes.  She does endorse some pain intermittently in the Achilles.  Pain in her right heel is also becoming more intermittent.  No numbness or tingling.  No imaging to date.  Past medical history reviewed Medications reviewed Allergies reviewed    Review of Systems As above    Objective:   Physical Exam  Well-developed, well-nourished.  No acute distress.  Awake alert and oriented x3.  Vital signs reviewed  Right heel: There is tenderness to palpation at the calcaneal origin of the plantar fascia.  Negative calcaneal squeeze.  No soft tissue swelling.  Skin is intact.  No tenderness to palpation along the Achilles tendon.  No thickening of the Achilles tendon.  Neutral  longitudinal arch with standing.  Collapsed transverse arch.  Good pulses.  Walking without a limp.  Brief bedside MSK ultrasound shows thickening of the plantar fascia.  Measures 0.55 cm.  There is also diffuse hypoechoic change throughout the insertion.  Achilles tendon is unremarkable.      Assessment & Plan:  Right heel pain secondary to plantar fasciitis  I referred the patient to Barbaraann Barthel for physical therapy.  If he offers dry needling, she can get that through his office.  However, I have also provided her with information for [facility] and [facility].  I did inspect her current shoe inserts and they are quite rigid with no cushioning at the heel.  Patient would like to return to our office sometime within the next couple weeks for custom orthotics.  I do think that would be better for her than her current inserts.  This note was dictated using Dragon naturally speaking software and may contain errors in syntax, spelling, or content which may not have been identified prior to signing this note.

## 2018-10-29 ENCOUNTER — Other Ambulatory Visit: Payer: Self-pay

## 2018-10-29 ENCOUNTER — Ambulatory Visit
Admission: RE | Admit: 2018-10-29 | Discharge: 2018-10-29 | Disposition: A | Discharge status: Home / Self Care | Payer: BC Managed Care – PPO | Source: Ambulatory Visit | Attending: Obstetrics & Gynecology | Admitting: Obstetrics & Gynecology

## 2018-10-29 ENCOUNTER — Encounter: Payer: BLUE CROSS/BLUE SHIELD | Admitting: Family Medicine

## 2018-10-29 DIAGNOSIS — Z1231 Encounter for screening mammogram for malignant neoplasm of breast: Secondary | ICD-10-CM

## 2018-11-04 ENCOUNTER — Other Ambulatory Visit: Payer: Self-pay

## 2018-11-04 ENCOUNTER — Ambulatory Visit (INDEPENDENT_AMBULATORY_CARE_PROVIDER_SITE_OTHER): Payer: BC Managed Care – PPO | Admitting: Sports Medicine

## 2018-11-04 VITALS — BP 130/70 | Ht 62.0 in | Wt 127.0 lb

## 2018-11-04 DIAGNOSIS — M722 Plantar fascial fibromatosis: Secondary | ICD-10-CM | POA: Diagnosis not present

## 2018-11-05 NOTE — Progress Notes (Signed)
  Patient comes in today for custom orthotics.  Please see the office note from October 28, 2018 for details regarding history and physical exam findings.  In short, this patient is dealing with plantar fasciitis.  She is scheduled to see Barbaraann Barthel later this week.  We had previously discussed custom orthotics at her previous office visit.  She is now ready to proceed with those.  Custom orthotics were created for this patient today.  She found them to be comfortable prior to leaving the office.  Gait was neutral with orthotics in place.  She will start physical therapy and may wean to a home exercise program per the therapist discretion.  Follow-up with me as needed.  Patient was fitted for a : standard, cushioned, semi-rigid orthotic. The orthotic was heated and afterward the patient stood on the orthotic blank positioned on the orthotic stand. The patient was positioned in subtalar neutral position and 10 degrees of ankle dorsiflexion in a weight bearing stance. After completion of molding, a stable base was applied to the orthotic blank. The blank was ground to a stable position for weight bearing. Size: 7 Base: Blue EVA Posting: none Additional orthotic padding: none

## 2018-11-07 DIAGNOSIS — M79671 Pain in right foot: Secondary | ICD-10-CM | POA: Diagnosis not present

## 2018-11-08 ENCOUNTER — Encounter: Payer: Self-pay | Admitting: Family Medicine

## 2018-11-08 ENCOUNTER — Other Ambulatory Visit: Payer: Self-pay

## 2018-11-08 ENCOUNTER — Ambulatory Visit (INDEPENDENT_AMBULATORY_CARE_PROVIDER_SITE_OTHER): Payer: BC Managed Care – PPO | Admitting: Family Medicine

## 2018-11-08 VITALS — BP 132/78 | HR 68 | Temp 98.0°F | Ht 61.5 in | Wt 126.0 lb

## 2018-11-08 DIAGNOSIS — Z0000 Encounter for general adult medical examination without abnormal findings: Secondary | ICD-10-CM

## 2018-11-08 LAB — CBC WITH DIFFERENTIAL/PLATELET
Basophils Absolute: 0 10*3/uL (ref 0.0–0.1)
Basophils Relative: 0.5 % (ref 0.0–3.0)
Eosinophils Absolute: 0 10*3/uL (ref 0.0–0.7)
Eosinophils Relative: 1 % (ref 0.0–5.0)
HCT: 42.7 % (ref 36.0–46.0)
Hemoglobin: 14.5 g/dL (ref 12.0–15.0)
Lymphocytes Relative: 45.2 % (ref 12.0–46.0)
Lymphs Abs: 1.7 10*3/uL (ref 0.7–4.0)
MCHC: 33.8 g/dL (ref 30.0–36.0)
MCV: 90 fl (ref 78.0–100.0)
Monocytes Absolute: 0.3 10*3/uL (ref 0.1–1.0)
Monocytes Relative: 6.8 % (ref 3.0–12.0)
Neutro Abs: 1.7 10*3/uL (ref 1.4–7.7)
Neutrophils Relative %: 46.5 % (ref 43.0–77.0)
Platelets: 226 10*3/uL (ref 150.0–400.0)
RBC: 4.75 Mil/uL (ref 3.87–5.11)
RDW: 14.2 % (ref 11.5–15.5)
WBC: 3.7 10*3/uL — ABNORMAL LOW (ref 4.0–10.5)

## 2018-11-08 LAB — LIPID PANEL
Cholesterol: 179 mg/dL (ref 0–200)
HDL: 45.9 mg/dL (ref >39.00)
LDL Cholesterol: 117 mg/dL — ABNORMAL HIGH (ref 0–99)
NonHDL: 133.53
Total CHOL/HDL Ratio: 4
Triglycerides: 84 mg/dL (ref 0.0–149.0)
VLDL: 16.8 mg/dL (ref 0.0–40.0)

## 2018-11-08 LAB — HEPATIC FUNCTION PANEL
ALT: 11 U/L (ref 0–35)
AST: 12 U/L (ref 0–37)
Albumin: 4.1 g/dL (ref 3.5–5.2)
Alkaline Phosphatase: 65 U/L (ref 39–117)
Bilirubin, Direct: 0.1 mg/dL (ref 0.0–0.3)
Total Bilirubin: 0.5 mg/dL (ref 0.2–1.2)
Total Protein: 6 g/dL (ref 6.0–8.3)

## 2018-11-08 LAB — BASIC METABOLIC PANEL
BUN: 17 mg/dL (ref 6–23)
CO2: 27 mEq/L (ref 19–32)
Calcium: 9.1 mg/dL (ref 8.4–10.5)
Chloride: 109 mEq/L (ref 96–112)
Creatinine, Ser: 0.88 mg/dL (ref 0.40–1.20)
GFR: 64.94 mL/min (ref >60.00)
Glucose, Bld: 89 mg/dL (ref 70–99)
Potassium: 4.4 mEq/L (ref 3.5–5.1)
Sodium: 142 mEq/L (ref 135–145)

## 2018-11-08 LAB — TSH: TSH: 2.89 u[IU]/mL (ref 0.35–4.50)

## 2018-11-08 NOTE — Progress Notes (Signed)
The 10-year ASCVD risk score Mikey Bussing DC Brooke Bonito., et al., 2013) is: 6.2%   Values used to calculate the score:     Age: 63 years     Sex: Female     Is Non-Hispanic African American: No     Diabetic: No     Tobacco smoker: No     Systolic Blood Pressure: 650 mmHg     Is BP treated: Yes     HDL Cholesterol: 51 mg/dL     Total Cholesterol: 208 mg/dL

## 2018-11-08 NOTE — Progress Notes (Signed)
Subjective:     Patient ID: Karen Salinas, female   DOB: 07-02-1955, 63 y.o.   MRN: [id number]  HPI   Patient here for physical exam.  She sees gynecologist yearly.  She had previous hysterectomy but still gets Pap smears every 3 years.  She had recent mammogram just couple weeks ago that was normal.  Previous hep C screening negative.  Tetanus due 2028.  Colonoscopy due 2022.  She is had previous shingles vaccine.  Gets yearly flu vaccine.  Her exercise from running has been curtailed because of some recent plantar fasciitis for which she is getting treatment from podiatrist.  Overall though she has done a great job controlling her weight.  She remains on low-dose losartan 25 mg daily for blood pressure and this is stable.  She is on estrogen patch per GYN  Past Medical History:  Diagnosis Date  . Hyperlipidemia   . IBS (irritable bowel syndrome)   . Kidney stones   . Low back pain   . Sinusitis    Past Surgical History:  Procedure Laterality Date  . ABDOMINAL HYSTERECTOMY    . BREAST CYST ASPIRATION    . COLONOSCOPY  2002  . CYSTOSCOPY WITH STENT PLACEMENT Right 12/16/2012   Procedure: RIGHT URETERAL STENT PLACEMENT;  Surgeon: Dutch Gray, MD;  Location: [facility];  Service: Urology;  Laterality: Right;  . CYSTOSCOPY/RETROGRADE/URETEROSCOPY Right 12/16/2012   Procedure: CYSTOSCOPY/RETROGRADE/URETEROSCOPY;  Surgeon: Dutch Gray, MD;  Location: [facility];  Service: Urology;  Laterality: Right;  . DILATION AND CURETTAGE OF UTERUS    . eye surgery for vision    . TUBAL LIGATION      reports that she has never smoked. She has never used smokeless tobacco. She reports current alcohol use of about 3.0 standard drinks of alcohol per week. She reports that she does not use drugs. family history includes Hyperlipidemia in her father; Hypertension in her father and mother; Stroke in her brother. Allergies  Allergen Reactions  . Shellfish Allergy Nausea And Vomiting     Review of Systems   Constitutional: Negative for activity change, appetite change, fatigue, fever and unexpected weight change.  HENT: Negative for ear pain, hearing loss, sore throat and trouble swallowing.   Eyes: Negative for visual disturbance.  Respiratory: Negative for cough and shortness of breath.   Cardiovascular: Negative for chest pain and palpitations.  Gastrointestinal: Negative for abdominal pain, blood in stool, constipation and diarrhea.  Genitourinary: Negative for dysuria and hematuria.  Musculoskeletal: Negative for arthralgias, back pain and myalgias.  Skin: Negative for rash.  Neurological: Negative for dizziness, syncope and headaches.  Hematological: Negative for adenopathy.  Psychiatric/Behavioral: Negative for confusion and dysphoric mood.       Objective:   Physical Exam Constitutional:      Appearance: She is well-developed.  HENT:     Head: Normocephalic and atraumatic.  Eyes:     Pupils: Pupils are equal, round, and reactive to light.  Neck:     Musculoskeletal: Normal range of motion and neck supple.     Thyroid: No thyromegaly.  Cardiovascular:     Rate and Rhythm: Normal rate and regular rhythm.     Heart sounds: Normal heart sounds. No murmur.  Pulmonary:     Effort: No respiratory distress.     Breath sounds: Normal breath sounds. No wheezing or rales.  Abdominal:     General: Bowel sounds are normal. There is no distension.     Palpations: Abdomen is soft. There is  no mass.     Tenderness: There is no abdominal tenderness. There is no guarding or rebound.  Musculoskeletal: Normal range of motion.  Lymphadenopathy:     Cervical: No cervical adenopathy.  Skin:    Findings: No rash.  Neurological:     Mental Status: She is alert and oriented to person, place, and time.     Cranial Nerves: No cranial nerve deficit.     Deep Tendon Reflexes: Reflexes normal.  Psychiatric:        Behavior: Behavior normal.        Thought Content: Thought content normal.         Judgment: Judgment normal.        Assessment:     Physical exam.  Patient has hypertension which is controlled with low-dose losartan.  History of mild hyperlipidemia but previous calcium score of 0    Plan:     -Continue yearly flu vaccine -Obtain screening labs -Continue regular exercise habits -consider DEXA by 65.  Eulas Post MD [facility] at [facility]

## 2018-11-15 DIAGNOSIS — M79671 Pain in right foot: Secondary | ICD-10-CM | POA: Diagnosis not present

## 2018-11-20 DIAGNOSIS — M79671 Pain in right foot: Secondary | ICD-10-CM | POA: Diagnosis not present

## 2018-12-03 DIAGNOSIS — M79671 Pain in right foot: Secondary | ICD-10-CM | POA: Diagnosis not present

## 2018-12-13 DIAGNOSIS — M79671 Pain in right foot: Secondary | ICD-10-CM | POA: Diagnosis not present

## 2018-12-16 ENCOUNTER — Other Ambulatory Visit: Payer: Self-pay | Admitting: Family Medicine

## 2018-12-24 DIAGNOSIS — M79671 Pain in right foot: Secondary | ICD-10-CM | POA: Diagnosis not present

## 2018-12-31 ENCOUNTER — Ambulatory Visit (INDEPENDENT_AMBULATORY_CARE_PROVIDER_SITE_OTHER): Payer: BC Managed Care – PPO | Admitting: Sports Medicine

## 2018-12-31 ENCOUNTER — Ambulatory Visit
Admission: RE | Admit: 2018-12-31 | Discharge: 2018-12-31 | Disposition: A | Discharge status: Home / Self Care | Payer: BC Managed Care – PPO | Source: Ambulatory Visit | Attending: Sports Medicine | Admitting: Sports Medicine

## 2018-12-31 ENCOUNTER — Other Ambulatory Visit: Payer: Self-pay

## 2018-12-31 VITALS — BP 132/84 | Ht 62.0 in | Wt 126.0 lb

## 2018-12-31 DIAGNOSIS — M7731 Calcaneal spur, right foot: Secondary | ICD-10-CM | POA: Diagnosis not present

## 2018-12-31 DIAGNOSIS — M722 Plantar fascial fibromatosis: Secondary | ICD-10-CM | POA: Diagnosis not present

## 2018-12-31 DIAGNOSIS — M79671 Pain in right foot: Secondary | ICD-10-CM

## 2019-01-01 NOTE — Progress Notes (Addendum)
   Subjective:    Patient ID: Karen Salinas, female    DOB: 08-18-1955, 63 y.o.   MRN: [id number]  HPI   Tuere comes in today with persistent right heel pain.  She continues to localize her pain to the plantar aspect of the heel.  Pain is most noticeable after exercise.  It is tolerable with exercise.  She denies pain elsewhere in the foot or ankle.  She has had several physical therapy visits with limited improvement.  She does note some improvement with the Strassburg sock.  She is wearing her custom orthotics and finds them to be comfortable.  Her main question for me today is whether or not I think she has a bone spur contributing to her pain.    Review of Systems    As above Objective:   Physical Exam  Well-developed, well-nourished.  No acute distress.  Awake alert and oriented x3.  Vital signs reviewed.  Right foot: Full ankle range of motion.  No effusion.  No soft tissue swelling.  There is tenderness to palpation along the medial aspect of the calcaneus at the origin of the plantar fascia.  Reproducible pain with ankle dorsiflexion and great toe extension.  Negative calcaneal squeeze.  No other tenderness to palpation around the foot or ankle.  Good pulses.  Walking without significant limp.  X-ray of the right heel does show a small bone spur at the origin of the plantar fascia.  Otherwise unremarkable.      Assessment & Plan:   Right heel pain secondary to plantar fasciitis.  Patient has had symptoms now for well over a year.  I did explain that although she does have a small bone spur on her x-ray, her pain generator is the plantar fascia.  Given the chronicity of her symptoms I did offer surgical consultation but she is not interested in surgery.  I have also reassured her that she may continue with exercise and activity using pain as her guide.  I think she has had enough formal physical therapy but she does understand the importance of continuing with her home exercise  program.  I recommended that she continue with her custom orthotics and also post exercise icing.  Continue with her Strassburg sock as long as she finds it to be helpful.  I also discussed the merits of further diagnostic imaging with an MRI but I think that is unnecessary at this point.  If she changes her mind, either about surgical consultation or further diagnostic imaging, she will let me know.  Otherwise, follow-up as needed.  Addendum: After leaving the office, I did consider referring Lorell to Dr. Amalia Hailey to discuss the possibility of extracorporal shockwave treatment.  I called Evangelyn to discuss this further.  This is something that she would be interested in discussing.  Therefore, I will make the referral to Dr. Amalia Hailey specifically for this.

## 2019-01-01 NOTE — Addendum Note (Signed)
Addended by: Cyd Silence on: 01/01/2019 03:49 PM   Modules accepted: Orders

## 2019-01-15 ENCOUNTER — Other Ambulatory Visit: Payer: Self-pay

## 2019-01-15 ENCOUNTER — Ambulatory Visit (INDEPENDENT_AMBULATORY_CARE_PROVIDER_SITE_OTHER): Payer: BC Managed Care – PPO

## 2019-01-15 ENCOUNTER — Ambulatory Visit: Payer: BC Managed Care – PPO | Admitting: Podiatry

## 2019-01-15 VITALS — BP 138/78 | HR 81

## 2019-01-15 DIAGNOSIS — M722 Plantar fascial fibromatosis: Secondary | ICD-10-CM | POA: Diagnosis not present

## 2019-01-15 MED ORDER — METHYLPREDNISOLONE 4 MG PO TBPK: ORAL_TABLET | ORAL | 0 refills | Status: DC

## 2019-01-18 NOTE — Progress Notes (Signed)
   Subjective: 63 y.o. female presenting today as a new patient with a chief complaint of plantar heel pain of the right foot that began about one year ago. Being on her feet for long periods of time increases the pain. She reports having physical therapy which provided no significant relief. Patient is here for further evaluation and treatment.   Past Medical History:  Diagnosis Date  . Hyperlipidemia   . IBS (irritable bowel syndrome)   . Kidney stones   . Low back pain   . Sinusitis      Objective: Physical Exam General: The patient is alert and oriented x3 in no acute distress.  Dermatology: Skin is warm, dry and supple bilateral lower extremities. Negative for open lesions or macerations bilateral.   Vascular: Dorsalis Pedis and Posterior Tibial pulses palpable bilateral.  Capillary fill time is immediate to all digits.  Neurological: Epicritic and protective threshold intact bilateral.   Musculoskeletal: Tenderness to palpation to the plantar aspect of the right heel along the plantar fascia. All other joints range of motion within normal limits bilateral. Strength 5/5 in all groups bilateral.   Radiographic exam: Normal osseous mineralization. Joint spaces preserved. No fracture/dislocation/boney destruction. No other soft tissue abnormalities or radiopaque foreign bodies.   Assessment: 1. Plantar fasciitis right  Plan of Care:  1. Patient evaluated. Xrays reviewed.   2. Injection of 0.5cc Celestone soluspan injected into the right plantar fascia  3. Rx for Medrol Dose Pack placed 4. Patient states NSAIDs cause heartburn.  5. Continue using custom orthotics from Dr. Alinda Money office.  6. Instructed patient regarding therapies and modalities at home to alleviate symptoms.  7. Return to clinic in 4 weeks. Possible EPAT if injection does not work.   Pickle ball player.    Edrick Kins, DPM [facility]  Dr. Edrick Kins, DPM    [address], [place] [address]                Office [phone]  Fax [phone]

## 2019-02-12 ENCOUNTER — Other Ambulatory Visit: Payer: Self-pay

## 2019-02-12 ENCOUNTER — Ambulatory Visit (INDEPENDENT_AMBULATORY_CARE_PROVIDER_SITE_OTHER): Payer: BC Managed Care – PPO | Admitting: Podiatry

## 2019-02-12 DIAGNOSIS — M722 Plantar fascial fibromatosis: Secondary | ICD-10-CM

## 2019-02-12 MED ORDER — DICLOFENAC SODIUM 75 MG PO TBEC: 75.0000 mg | DELAYED_RELEASE_TABLET | Freq: Two times a day (BID) | ORAL | 1 refills | Status: DC

## 2019-02-13 DIAGNOSIS — Z6824 Body mass index (BMI) 24.0-24.9, adult: Secondary | ICD-10-CM | POA: Diagnosis not present

## 2019-02-13 DIAGNOSIS — Z01419 Encounter for gynecological examination (general) (routine) without abnormal findings: Secondary | ICD-10-CM | POA: Diagnosis not present

## 2019-02-16 NOTE — Progress Notes (Signed)
   Subjective: 63 y.o. female presenting today for follow up evaluation of plantar fasciitis of the right foot. She states she is about 50% better after receiving the injection and taking the Medrol Dose Pak. She reports some mild intermittent pain when on the foot for too long. She has questions regarding EPAT. Patient is here for further evaluation and treatment.   Past Medical History:  Diagnosis Date  . Hyperlipidemia   . IBS (irritable bowel syndrome)   . Kidney stones   . Low back pain   . Sinusitis      Objective: Physical Exam General: The patient is alert and oriented x3 in no acute distress.  Dermatology: Skin is warm, dry and supple bilateral lower extremities. Negative for open lesions or macerations bilateral.   Vascular: Dorsalis Pedis and Posterior Tibial pulses palpable bilateral.  Capillary fill time is immediate to all digits.  Neurological: Epicritic and protective threshold intact bilateral.   Musculoskeletal: Tenderness to palpation to the plantar aspect of the right heel along the plantar fascia. All other joints range of motion within normal limits bilateral. Strength 5/5 in all groups bilateral.     Assessment: 1. Plantar fasciitis right - improved   Plan of Care:  1. Patient evaluated.    2. Injection of 0.5cc Celestone soluspan injected into the right plantar fascia  3. Prescription for Diclofenac provided to patient.  4. We will hold off on EPAT for now.   5. Continue using custom orthotics from Dr. Alinda Money office.  6. Return to clinic in 6 weeks.   Pickle ball player.    Edrick Kins, DPM [facility]  Dr. Edrick Kins, DPM    [address], [place] [address]                Office [phone]  Fax [phone]

## 2019-02-19 DIAGNOSIS — Z1211 Encounter for screening for malignant neoplasm of colon: Secondary | ICD-10-CM | POA: Diagnosis not present

## 2019-03-12 ENCOUNTER — Other Ambulatory Visit: Payer: Self-pay

## 2019-03-12 DIAGNOSIS — Z20822 Contact with and (suspected) exposure to covid-19: Secondary | ICD-10-CM

## 2019-03-12 DIAGNOSIS — Z20828 Contact with and (suspected) exposure to other viral communicable diseases: Secondary | ICD-10-CM | POA: Diagnosis not present

## 2019-03-14 LAB — NOVEL CORONAVIRUS, NAA: SARS-CoV-2, NAA: NOT DETECTED

## 2019-03-26 ENCOUNTER — Telehealth: Payer: Self-pay | Admitting: *Deleted

## 2019-03-26 ENCOUNTER — Ambulatory Visit: Payer: BC Managed Care – PPO | Admitting: Podiatry

## 2019-03-26 ENCOUNTER — Other Ambulatory Visit: Payer: Self-pay

## 2019-03-26 DIAGNOSIS — M722 Plantar fascial fibromatosis: Secondary | ICD-10-CM | POA: Diagnosis not present

## 2019-03-26 NOTE — Telephone Encounter (Signed)
Left message informing pt she could stop by the office between 8:00am and 5:00pm and either I or an assistant would fit her with a proper size of night splint.

## 2019-03-26 NOTE — Telephone Encounter (Signed)
Pt states she was given a night splint and she reviewed the instructions and they say her toes should not hang off, and her toes do hang off and she would like to get a proper fitted night splint.

## 2019-03-26 NOTE — Telephone Encounter (Signed)
Pt states she was out and picked up a larger night splint, and thanked me for my call.

## 2019-03-31 NOTE — Progress Notes (Signed)
   Subjective: 63 y.o. female presenting today for follow up evaluation of right foot pain. She states she is doing well. She states the injection helped alleviate the pain. She was taking the Diclofenac but discontinued because she states it caused heart burn. Being on the foot for long periods of time increases the pain. Patient is here for further evaluation and treatment.   Past Medical History:  Diagnosis Date  . Hyperlipidemia   . IBS (irritable bowel syndrome)   . Kidney stones   . Low back pain   . Sinusitis      Objective: Physical Exam General: The patient is alert and oriented x3 in no acute distress.  Dermatology: Skin is warm, dry and supple bilateral lower extremities. Negative for open lesions or macerations bilateral.   Vascular: Dorsalis Pedis and Posterior Tibial pulses palpable bilateral.  Capillary fill time is immediate to all digits.  Neurological: Epicritic and protective threshold intact bilateral.   Musculoskeletal: Pain on palpation noted to the posterior tubercle of the right calcaneus at the insertion of the Achilles tendon consistent with retrocalcaneal bursitis. Range of motion within normal limits. Muscle strength 5/5 in all muscle groups bilateral lower extremities.     Assessment: 1. Plantar fasciitis right - resolved  2. Insertional Achilles tendinitis right - lateral   Plan of Care:  1. Patient evaluated.    2. Continue conservative treatment.  3. Night splint dispensed.  4. Recommended good shoe gear.  5. Return to clinic as needed.    Pickle ball player.    Edrick Kins, DPM [facility]  Dr. Edrick Kins, DPM    [address], [place] [address]                Office [phone]  Fax [phone]

## 2019-06-07 ENCOUNTER — Other Ambulatory Visit: Payer: Self-pay | Admitting: Family Medicine

## 2019-07-16 DIAGNOSIS — G5623 Lesion of ulnar nerve, bilateral upper limbs: Secondary | ICD-10-CM | POA: Diagnosis not present

## 2019-07-16 DIAGNOSIS — R200 Anesthesia of skin: Secondary | ICD-10-CM | POA: Diagnosis not present

## 2019-08-10 NOTE — Progress Notes (Signed)
Cardiology Office Note   Date:  08/11/2019   ID:  Naisha, Wisdom 12/04/1955, MRN [id number]  PCP:  Eulas Post, MD  Cardiologist:   Dorris Carnes, MD   F/U of hyperlipidemia    History of Present Illness: Karen Salinas is a 64 y.o. female with a history of HL  I saw her back in 2018    Calcium score in 2018 was 0   CT of abdomen in past had minimal calcium   Since she was last in clinic she hsa done well  Breathing is good  Denies CP   Very active     Current Meds  Medication Sig  . estradiol (MINIVELLE) 0.05 MG/24HR Place 1 patch onto the skin. Two times per week Tues and Saturdays  . losartan (COZAAR) 25 MG tablet TAKE 1 TABLET BY MOUTH DAILY     Allergies:   Shellfish allergy   Past Medical History:  Diagnosis Date  . Hyperlipidemia   . IBS (irritable bowel syndrome)   . Kidney stones   . Low back pain   . Sinusitis     Past Surgical History:  Procedure Laterality Date  . ABDOMINAL HYSTERECTOMY    . BREAST CYST ASPIRATION    . COLONOSCOPY  2002  . CYSTOSCOPY WITH STENT PLACEMENT Right 12/16/2012   Procedure: RIGHT URETERAL STENT PLACEMENT;  Surgeon: Dutch Gray, MD;  Location: [facility];  Service: Urology;  Laterality: Right;  . CYSTOSCOPY/RETROGRADE/URETEROSCOPY Right 12/16/2012   Procedure: CYSTOSCOPY/RETROGRADE/URETEROSCOPY;  Surgeon: Dutch Gray, MD;  Location: [facility];  Service: Urology;  Laterality: Right;  . DILATION AND CURETTAGE OF UTERUS    . eye surgery for vision    . TUBAL LIGATION       Social History:  The patient  reports that she has never smoked. She has never used smokeless tobacco. She reports current alcohol use of about 3.0 standard drinks of alcohol per week. She reports that she does not use drugs.   Family History:  The patient's family history includes Hyperlipidemia in her father; Hypertension in her father and mother; Stroke in her brother.    ROS:  Please see the history of present illness. All other systems are reviewed and   Negative to the above problem except as noted.    PHYSICAL EXAM: VS:  BP (!) 142/80   Pulse 72   Ht 5\' 2"  (1.575 m)   Wt 132 lb (59.9 kg)   BMI 24.14 kg/m   GEN: Well nourished, well developed, in no acute distress  HEENT: normal  Neck: no JVD  No carotid bruits Cardiac: RRR; no murmurs, rubs, or gallops,no LE  edema  Respiratory:  clear to auscultation bilaterally  GI: soft, nontender, nondistended, + BS  No hepatomegaly  MS: no deformity Moving all extremities   Skin: warm and dry, no rash Neuro:  Strength and sensation are intact Psych: euthymic mood, full affect   EKG:  EKG is ordered today.  SR 72 bpm    Lipid Panel    Component Value Date/Time   CHOL 179 11/08/2018 1117   CHOL 208 (H) 04/22/2018 0743   TRIG 84.0 11/08/2018 1117   HDL 45.90 11/08/2018 1117   HDL 51 04/22/2018 0743   CHOLHDL 4 11/08/2018 1117   VLDL 16.8 11/08/2018 1117   LDLCALC 117 (H) 11/08/2018 1117   LDLCALC 136 (H) 04/22/2018 0743   LDLDIRECT 155.9 06/12/2013 0837      Wt Readings from Last 3 Encounters:  08/11/19 132 lb (59.9 kg)  12/31/18 126 lb (57.2 kg)  11/08/18 126 lb (57.2 kg)      ASSESSMENT AND PLAN:  1  Dyslipidemia   Will get fasting lipids   In JUne LDL was 117  2  Blood pressure   BP on home check:  Initial reading is 140s   Then repeated attempts is lower    I  Would recomm increasing losartan to 25 bid.  FOlllow BP at home to see if readings tighter/lower     3  Aortic dilation   Proximal asc aorta is 39 mm at level of PA  WIll repeat Will check BMET , CBC, Lipids and Vit D   F/U in 12 months in clinic   Current medicines are reviewed at length with the patient today.  The patient does not have concerns regarding medicines.  Signed, Dorris Carnes, MD  08/11/2019 9:42 AM    [address], [place], [place]  [address] Phone: [phone]; Fax: [phone]

## 2019-08-11 ENCOUNTER — Ambulatory Visit: Payer: BC Managed Care – PPO | Admitting: Internal Medicine

## 2019-08-11 ENCOUNTER — Other Ambulatory Visit: Payer: Self-pay

## 2019-08-11 ENCOUNTER — Encounter: Payer: Self-pay | Admitting: Internal Medicine

## 2019-08-11 VITALS — BP 142/80 | HR 72 | Ht 62.0 in | Wt 132.0 lb

## 2019-08-11 DIAGNOSIS — I77810 Thoracic aortic ectasia: Secondary | ICD-10-CM

## 2019-08-11 DIAGNOSIS — E559 Vitamin D deficiency, unspecified: Secondary | ICD-10-CM | POA: Diagnosis not present

## 2019-08-11 DIAGNOSIS — I10 Essential (primary) hypertension: Secondary | ICD-10-CM

## 2019-08-11 DIAGNOSIS — E785 Hyperlipidemia, unspecified: Secondary | ICD-10-CM

## 2019-08-11 MED ORDER — LOSARTAN POTASSIUM 25 MG PO TABS: 25.0000 mg | ORAL_TABLET | Freq: Two times a day (BID) | ORAL | 3 refills | Status: DC

## 2019-08-11 NOTE — Patient Instructions (Signed)
Medication Instructions:  Your physician has recommended you make the following change in your medication:  1.) increase losartan to 25 mg twice daily  *If you need a refill on your cardiac medications before your next appointment, please call your pharmacy*   Lab Work: Today: bmet, cbc, vit d, lipids  If you have labs (blood work) drawn today and your tests are completely normal, you will receive your results only by: Marland Kitchen MyChart Message (if you have MyChart) OR . A paper copy in the mail If you have any lab test that is abnormal or we need to change your treatment, we will call you to review the results.   Testing/Procedures: MR angio chest to eval aorta   Follow-Up: At [facility], you and your health needs are our priority.  As part of our continuing mission to provide you with exceptional heart care, we have created designated Provider Care Teams.  These Care Teams include your primary Cardiologist (physician) and Advanced Practice Providers (APPs -  Physician Assistants and Nurse Practitioners) who all work together to provide you with the care you need, when you need it.  Your next appointment:   12 month(s)  The format for your next appointment:   Either In Person or Virtual  Provider:   Dorris Carnes, MD   Other Instructions

## 2019-08-12 LAB — LIPID PANEL
Chol/HDL Ratio: 4.1 ratio (ref 0.0–4.4)
Cholesterol, Total: 206 mg/dL — ABNORMAL HIGH (ref 100–199)
HDL: 50 mg/dL (ref >39)
LDL Chol Calc (NIH): 136 mg/dL — ABNORMAL HIGH (ref 0–99)
Triglycerides: 112 mg/dL (ref 0–149)
VLDL Cholesterol Cal: 20 mg/dL (ref 5–40)

## 2019-08-12 LAB — BASIC METABOLIC PANEL
BUN/Creatinine Ratio: 14 (ref 12–28)
BUN: 14 mg/dL (ref 8–27)
CO2: 21 mmol/L (ref 20–29)
Calcium: 9 mg/dL (ref 8.7–10.3)
Chloride: 103 mmol/L (ref 96–106)
Creatinine, Ser: 0.98 mg/dL (ref 0.57–1.00)
GFR calc Af Amer: 71 mL/min/{1.73_m2} (ref >59)
GFR calc non Af Amer: 62 mL/min/{1.73_m2} (ref >59)
Glucose: 106 mg/dL — ABNORMAL HIGH (ref 65–99)
Potassium: 4.4 mmol/L (ref 3.5–5.2)
Sodium: 140 mmol/L (ref 134–144)

## 2019-08-12 LAB — CBC
Hematocrit: 45.2 % (ref 34.0–46.6)
Hemoglobin: 15.8 g/dL (ref 11.1–15.9)
MCH: 31.2 pg (ref 26.6–33.0)
MCHC: 35 g/dL (ref 31.5–35.7)
MCV: 89 fL (ref 79–97)
Platelets: 279 10*3/uL (ref 150–450)
RBC: 5.06 x10E6/uL (ref 3.77–5.28)
RDW: 12.8 % (ref 11.7–15.4)
WBC: 4.9 10*3/uL (ref 3.4–10.8)

## 2019-08-12 LAB — VITAMIN D 25 HYDROXY (VIT D DEFICIENCY, FRACTURES): Vit D, 25-Hydroxy: 49 ng/mL (ref 30.0–100.0)

## 2019-08-27 ENCOUNTER — Other Ambulatory Visit: Payer: Self-pay

## 2019-08-27 ENCOUNTER — Ambulatory Visit (HOSPITAL_COMMUNITY)
Admission: RE | Admit: 2019-08-27 | Discharge: 2019-08-27 | Disposition: A | Discharge status: Home / Self Care | Payer: BC Managed Care – PPO | Source: Ambulatory Visit | Attending: Internal Medicine | Admitting: Internal Medicine

## 2019-08-27 DIAGNOSIS — I77810 Thoracic aortic ectasia: Secondary | ICD-10-CM

## 2019-08-27 DIAGNOSIS — I10 Essential (primary) hypertension: Secondary | ICD-10-CM | POA: Diagnosis not present

## 2019-08-27 DIAGNOSIS — E785 Hyperlipidemia, unspecified: Secondary | ICD-10-CM | POA: Diagnosis not present

## 2019-08-27 DIAGNOSIS — I712 Thoracic aortic aneurysm, without rupture: Secondary | ICD-10-CM | POA: Diagnosis not present

## 2019-08-27 MED ORDER — GADOBUTROL 1 MMOL/ML IV SOLN
6.0000 mL | Freq: Once | INTRAVENOUS | Status: AC | PRN
  Administered 2019-08-27: 6 mL via INTRAVENOUS

## 2019-08-28 DIAGNOSIS — G5623 Lesion of ulnar nerve, bilateral upper limbs: Secondary | ICD-10-CM | POA: Diagnosis not present

## 2019-09-03 ENCOUNTER — Other Ambulatory Visit: Payer: Self-pay | Admitting: Obstetrics & Gynecology

## 2019-09-03 DIAGNOSIS — Z1231 Encounter for screening mammogram for malignant neoplasm of breast: Secondary | ICD-10-CM

## 2019-11-03 ENCOUNTER — Ambulatory Visit
Admission: RE | Admit: 2019-11-03 | Discharge: 2019-11-03 | Disposition: A | Discharge status: Home / Self Care | Payer: BC Managed Care – PPO | Source: Ambulatory Visit | Attending: Obstetrics & Gynecology | Admitting: Obstetrics & Gynecology

## 2019-11-03 ENCOUNTER — Other Ambulatory Visit: Payer: Self-pay

## 2019-11-03 DIAGNOSIS — Z1231 Encounter for screening mammogram for malignant neoplasm of breast: Secondary | ICD-10-CM | POA: Diagnosis not present

## 2019-11-04 DIAGNOSIS — D225 Melanocytic nevi of trunk: Secondary | ICD-10-CM | POA: Diagnosis not present

## 2019-11-04 DIAGNOSIS — Z86018 Personal history of other benign neoplasm: Secondary | ICD-10-CM | POA: Diagnosis not present

## 2019-11-04 DIAGNOSIS — L821 Other seborrheic keratosis: Secondary | ICD-10-CM | POA: Diagnosis not present

## 2019-11-04 DIAGNOSIS — L814 Other melanin hyperpigmentation: Secondary | ICD-10-CM | POA: Diagnosis not present

## 2019-11-10 ENCOUNTER — Ambulatory Visit: Payer: BC Managed Care – PPO | Admitting: Podiatry

## 2020-02-02 ENCOUNTER — Telehealth: Payer: Self-pay | Admitting: Gastroenterology

## 2020-02-02 NOTE — Telephone Encounter (Signed)
Patient called will call for recall colonoscopy with Dr. Ardis Hughs 03/2022.

## 2020-02-11 DIAGNOSIS — M25551 Pain in right hip: Secondary | ICD-10-CM | POA: Diagnosis not present

## 2020-03-22 ENCOUNTER — Encounter: Payer: Self-pay | Admitting: Family Medicine

## 2020-03-22 ENCOUNTER — Ambulatory Visit (INDEPENDENT_AMBULATORY_CARE_PROVIDER_SITE_OTHER): Payer: BC Managed Care – PPO | Admitting: Family Medicine

## 2020-03-22 ENCOUNTER — Other Ambulatory Visit: Payer: Self-pay

## 2020-03-22 VITALS — BP 116/78 | HR 78 | Temp 97.7°F | Ht 62.0 in | Wt 129.0 lb

## 2020-03-22 DIAGNOSIS — Z0000 Encounter for general adult medical examination without abnormal findings: Secondary | ICD-10-CM

## 2020-03-22 NOTE — Progress Notes (Signed)
Established Patient Office Visit  Subjective:  Patient ID: Karen Salinas, female    DOB: 1956/01/27  Age: 64 y.o. MRN: [id number]  CC:  Chief Complaint  Patient presents with  . Annual Exam    SINUS DRAINAGE IN BACK OF THROAT, NEG COVID TEST     HPI [name] presents for physical exam.  She has recently had some nasal congestion and she thinks this is dust related.  She has not taken anything for this thus far.  Does not have any acute sinusitis type symptoms.  She has history of hypertension currently controlled with losartan.  Generally very healthy.  She exercises regularly.  Non-smoker.  Maintenance reviewed  -Flu vaccine already given -She had J&J Covid vaccine and plans to get Moderna booster soon -Previous hepatitis C screen negative -Tetanus due 2028 -Mammogram 6/21 -Colonoscopy due 2022 -She has received previous Shingrix vaccine -Schedule for repeat Pap smear next week with OB/GYN -Gets yearly dermatology checks  Social history-married.  She and her husband just bought a place in [place] that is a second home but they live here predominantly.  Non-smoker.  Occasional alcohol use  Family history-reviewed with no significant changes  Past Medical History:  Diagnosis Date  . Hyperlipidemia   . IBS (irritable bowel syndrome)   . Kidney stones   . Low back pain   . Sinusitis     Past Surgical History:  Procedure Laterality Date  . ABDOMINAL HYSTERECTOMY    . BREAST CYST ASPIRATION    . COLONOSCOPY  2002  . CYSTOSCOPY WITH STENT PLACEMENT Right 12/16/2012   Procedure: RIGHT URETERAL STENT PLACEMENT;  Surgeon: Dutch Gray, MD;  Location: [facility];  Service: Urology;  Laterality: Right;  . CYSTOSCOPY/RETROGRADE/URETEROSCOPY Right 12/16/2012   Procedure: CYSTOSCOPY/RETROGRADE/URETEROSCOPY;  Surgeon: Dutch Gray, MD;  Location: [facility];  Service: Urology;  Laterality: Right;  . DILATION AND CURETTAGE OF UTERUS    . eye surgery for vision    . TUBAL LIGATION       Family History  Problem Relation Age of Onset  . Hypertension Mother   . Hypertension Father   . Hyperlipidemia Father   . Stroke Brother     Social History   Socioeconomic History  . Marital status: Divorced    Spouse name: Not on file  . Number of children: Not on file  . Years of education: Not on file  . Highest education level: Not on file  Occupational History  . Not on file  Tobacco Use  . Smoking status: Never Smoker  . Smokeless tobacco: Never Used  Vaping Use  . Vaping Use: Never used  Substance and Sexual Activity  . Alcohol use: Yes    Alcohol/week: 3.0 standard drinks    Types: 3 Glasses of wine per week    Comment: rare  . Drug use: No  . Sexual activity: Not on file  Other Topics Concern  . Not on file  Social History Narrative   Regular exercise         Social Determinants of Health   Financial Resource Strain:   . Difficulty of Paying Living Expenses: Not on file  Food Insecurity:   . Worried About Charity fundraiser in the Last Year: Not on file  . Ran Out of Food in the Last Year: Not on file  Transportation Needs:   . Lack of Transportation (Medical): Not on file  . Lack of Transportation (Non-Medical): Not on file  Physical  Activity:   . Days of Exercise per Week: Not on file  . Minutes of Exercise per Session: Not on file  Stress:   . Feeling of Stress : Not on file  Social Connections:   . Frequency of Communication with Friends and Family: Not on file  . Frequency of Social Gatherings with Friends and Family: Not on file  . Attends Religious Services: Not on file  . Active Member of Clubs or Organizations: Not on file  . Attends Archivist Meetings: Not on file  . Marital Status: Not on file  Intimate Partner Violence:   . Fear of Current or Ex-Partner: Not on file  . Emotionally Abused: Not on file  . Physically Abused: Not on file  . Sexually Abused: Not on file    Outpatient Medications Prior to Visit   Medication Sig Dispense Refill  . estradiol (MINIVELLE) 0.05 MG/24HR Place 1 patch onto the skin. Two times per week Tues and Saturdays    . losartan (COZAAR) 25 MG tablet TAKE 1 TABLET BY MOUTH DAILY 90 tablet 0  . losartan (COZAAR) 25 MG tablet Take 1 tablet (25 mg total) by mouth in the morning and at bedtime. 180 tablet 3   No facility-administered medications prior to visit.    Allergies  Allergen Reactions  . Shellfish Allergy Nausea And Vomiting    ROS Review of Systems  Constitutional: Negative for activity change, appetite change, fatigue, fever and unexpected weight change.  HENT: Positive for congestion and postnasal drip. Negative for ear pain, hearing loss, sinus pressure, sinus pain, sore throat and trouble swallowing.   Eyes: Negative for visual disturbance.  Respiratory: Negative for cough and shortness of breath.   Cardiovascular: Negative for chest pain and palpitations.  Gastrointestinal: Negative for abdominal pain, blood in stool, constipation and diarrhea.  Genitourinary: Negative for dysuria and hematuria.  Musculoskeletal: Negative for arthralgias, back pain and myalgias.  Skin: Negative for rash.  Neurological: Negative for dizziness, syncope and headaches.  Hematological: Negative for adenopathy.  Psychiatric/Behavioral: Negative for confusion and dysphoric mood.      Objective:    Physical Exam Constitutional:      Appearance: She is well-developed.  HENT:     Right Ear: Tympanic membrane, ear canal and external ear normal.     Left Ear: Tympanic membrane, ear canal and external ear normal.  Eyes:     Pupils: Pupils are equal, round, and reactive to light.  Neck:     Thyroid: No thyromegaly.     Vascular: No JVD.  Cardiovascular:     Rate and Rhythm: Normal rate and regular rhythm.     Heart sounds: No gallop.   Pulmonary:     Effort: Pulmonary effort is normal. No respiratory distress.     Breath sounds: Normal breath sounds. No wheezing  or rales.  Musculoskeletal:     Cervical back: Neck supple.     Right lower leg: No edema.     Left lower leg: No edema.  Neurological:     Mental Status: She is alert.     BP 116/78 (BP Location: Left Arm, Patient Position: Sitting, Cuff Size: Normal)   Pulse 78   Temp 97.7 F (36.5 C) (Oral)   Ht 5\' 2"  (1.575 m)   Wt 129 lb (58.5 kg)   SpO2 98%   BMI 23.59 kg/m  Wt Readings from Last 3 Encounters:  03/22/20 129 lb (58.5 kg)  08/11/19 132 lb (59.9 kg)  12/31/18 126  lb (57.2 kg)     Health Maintenance Due  Topic Date Due  . HIV Screening  Never done  . PAP SMEAR-Modifier  01/17/2018    There are no preventive care reminders to display for this patient.  Lab Results  Component Value Date   TSH 2.89 11/08/2018   Lab Results  Component Value Date   WBC 4.9 08/11/2019   HGB 15.8 08/11/2019   HCT 45.2 08/11/2019   MCV 89 08/11/2019   PLT 279 08/11/2019   Lab Results  Component Value Date   NA 140 08/11/2019   K 4.4 08/11/2019   CO2 21 08/11/2019   GLUCOSE 106 (H) 08/11/2019   BUN 14 08/11/2019   CREATININE 0.98 08/11/2019   BILITOT 0.5 11/08/2018   ALKPHOS 65 11/08/2018   AST 12 11/08/2018   ALT 11 11/08/2018   PROT 6.0 11/08/2018   ALBUMIN 4.1 11/08/2018   CALCIUM 9.0 08/11/2019   ANIONGAP 11 11/07/2016   GFR 64.94 11/08/2018   Lab Results  Component Value Date   CHOL 206 (H) 08/11/2019   Lab Results  Component Value Date   HDL 50 08/11/2019   Lab Results  Component Value Date   LDLCALC 136 (H) 08/11/2019   Lab Results  Component Value Date   TRIG 112 08/11/2019   Lab Results  Component Value Date   CHOLHDL 4.1 08/11/2019   No results found for: HGBA1C    Assessment & Plan:   Problem List Items Addressed This Visit    None    Visit Diagnoses    Physical exam    -  Primary   Relevant Orders   Basic metabolic panel   Lipid panel   CBC with Differential/Platelet   TSH   Hepatic function panel   Hemoglobin A1c    Generally  healthy 64 year old female.  She has hypertension which is well controlled.  -Obtain labs as above -Continue with annual flu vaccine -She plans to get Covid vaccine booster this week -She plans to continue with GYN follow-up for mammograms and Pap smears -Continue leg with weightbearing exercise -She will discuss with her GYN pros and cons regarding ongoing estrogen use  No orders of the defined types were placed in this encounter.   Follow-up: No follow-ups on file.    Carolann Littler, MD

## 2020-03-22 NOTE — Patient Instructions (Signed)

## 2020-03-23 ENCOUNTER — Encounter: Payer: Self-pay | Admitting: Family Medicine

## 2020-03-23 LAB — HEMOGLOBIN A1C
Hgb A1c MFr Bld: 5.2 % of total Hgb (ref <5.7)
Mean Plasma Glucose: 103 (calc)
eAG (mmol/L): 5.7 (calc)

## 2020-03-23 LAB — CBC WITH DIFFERENTIAL/PLATELET
Absolute Monocytes: 340 cells/uL (ref 200–950)
Basophils Absolute: 19 cells/uL (ref 0–200)
Basophils Relative: 0.5 %
Eosinophils Absolute: 30 cells/uL (ref 15–500)
Eosinophils Relative: 0.8 %
HCT: 45.1 % — ABNORMAL HIGH (ref 35.0–45.0)
Hemoglobin: 14.6 g/dL (ref 11.7–15.5)
Lymphs Abs: 1454 cells/uL (ref 850–3900)
MCH: 29.7 pg (ref 27.0–33.0)
MCHC: 32.4 g/dL (ref 32.0–36.0)
MCV: 91.7 fL (ref 80.0–100.0)
MPV: 9.6 fL (ref 7.5–12.5)
Monocytes Relative: 9.2 %
Neutro Abs: 1857 cells/uL (ref 1500–7800)
Neutrophils Relative %: 50.2 %
Platelets: 294 10*3/uL (ref 140–400)
RBC: 4.92 10*6/uL (ref 3.80–5.10)
RDW: 12.7 % (ref 11.0–15.0)
Total Lymphocyte: 39.3 %
WBC: 3.7 10*3/uL — ABNORMAL LOW (ref 3.8–10.8)

## 2020-03-23 LAB — HEPATIC FUNCTION PANEL
AG Ratio: 2 (calc) (ref 1.0–2.5)
ALT: 18 U/L (ref 6–29)
AST: 16 U/L (ref 10–35)
Albumin: 4.4 g/dL (ref 3.6–5.1)
Alkaline phosphatase (APISO): 68 U/L (ref 37–153)
Bilirubin, Direct: 0.1 mg/dL (ref 0.0–0.2)
Globulin: 2.2 g/dL (calc) (ref 1.9–3.7)
Indirect Bilirubin: 0.4 mg/dL (calc) (ref 0.2–1.2)
Total Bilirubin: 0.5 mg/dL (ref 0.2–1.2)
Total Protein: 6.6 g/dL (ref 6.1–8.1)

## 2020-03-23 LAB — LIPID PANEL
Cholesterol: 203 mg/dL — ABNORMAL HIGH (ref <200)
HDL: 46 mg/dL — ABNORMAL LOW (ref >=50)
LDL Cholesterol (Calc): 135 mg/dL (calc) — ABNORMAL HIGH
Non-HDL Cholesterol (Calc): 157 mg/dL (calc) — ABNORMAL HIGH (ref <130)
Total CHOL/HDL Ratio: 4.4 (calc) (ref <5.0)
Triglycerides: 112 mg/dL (ref <150)

## 2020-03-23 LAB — BASIC METABOLIC PANEL
BUN: 16 mg/dL (ref 7–25)
CO2: 26 mmol/L (ref 20–32)
Calcium: 9.5 mg/dL (ref 8.6–10.4)
Chloride: 106 mmol/L (ref 98–110)
Creat: 0.96 mg/dL (ref 0.50–0.99)
Glucose, Bld: 89 mg/dL (ref 65–99)
Potassium: 5 mmol/L (ref 3.5–5.3)
Sodium: 140 mmol/L (ref 135–146)

## 2020-03-23 LAB — TSH: TSH: 2.56 mIU/L (ref 0.40–4.50)

## 2020-03-24 DIAGNOSIS — Z6824 Body mass index (BMI) 24.0-24.9, adult: Secondary | ICD-10-CM | POA: Diagnosis not present

## 2020-03-24 DIAGNOSIS — Z01419 Encounter for gynecological examination (general) (routine) without abnormal findings: Secondary | ICD-10-CM | POA: Diagnosis not present

## 2020-04-27 DIAGNOSIS — M7601 Gluteal tendinitis, right hip: Secondary | ICD-10-CM | POA: Diagnosis not present

## 2020-04-27 DIAGNOSIS — M25551 Pain in right hip: Secondary | ICD-10-CM | POA: Diagnosis not present

## 2020-05-04 DIAGNOSIS — M6281 Muscle weakness (generalized): Secondary | ICD-10-CM | POA: Diagnosis not present

## 2020-05-04 DIAGNOSIS — M25551 Pain in right hip: Secondary | ICD-10-CM | POA: Diagnosis not present

## 2020-05-04 DIAGNOSIS — M7602 Gluteal tendinitis, left hip: Secondary | ICD-10-CM | POA: Diagnosis not present

## 2020-05-04 DIAGNOSIS — M7601 Gluteal tendinitis, right hip: Secondary | ICD-10-CM | POA: Diagnosis not present

## 2020-06-17 IMAGING — MG DIGITAL SCREENING BILAT W/ TOMO W/ CAD
8 series · 9 of 24 positions shown · non-contrast
Comparison: Previous exam(s).

CLINICAL DATA: Screening.

EXAM:
DIGITAL SCREENING BILATERAL MAMMOGRAM WITH TOMO AND CAD

[L MLO synth-2D]
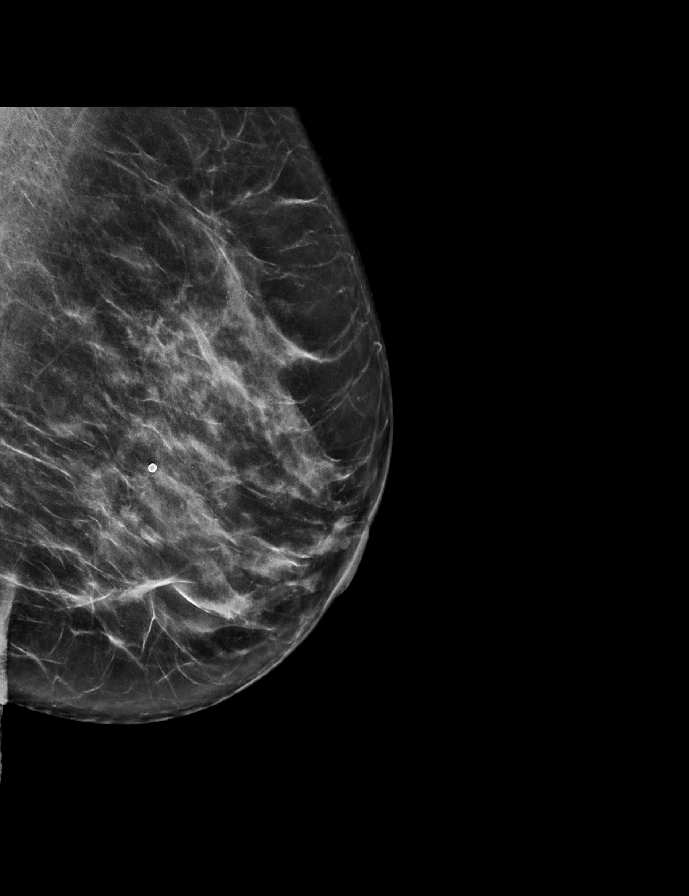

[L CC synth-2D]
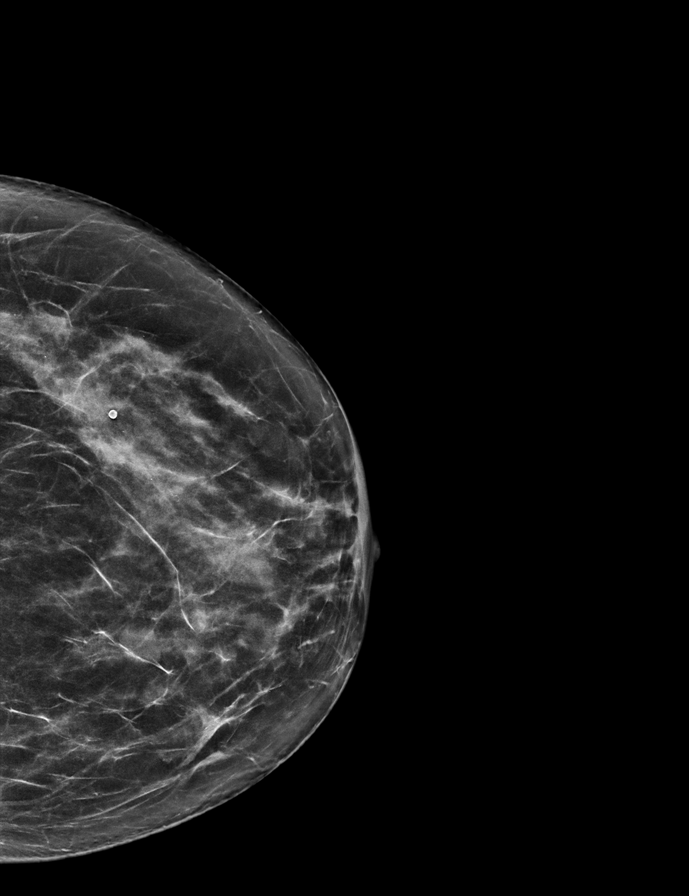

[R CC synth-2D]
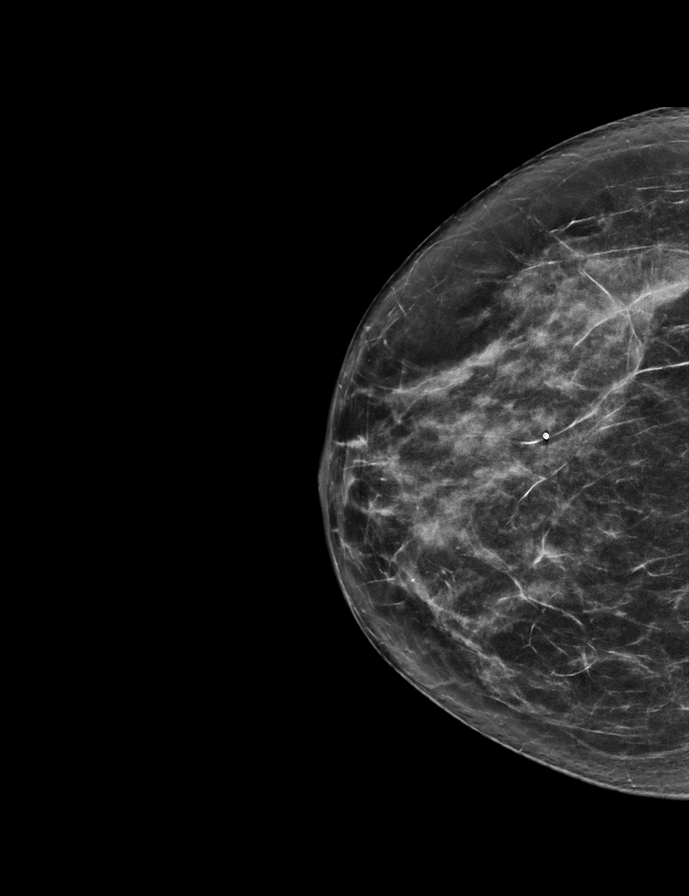

[R MLO synth-2D]
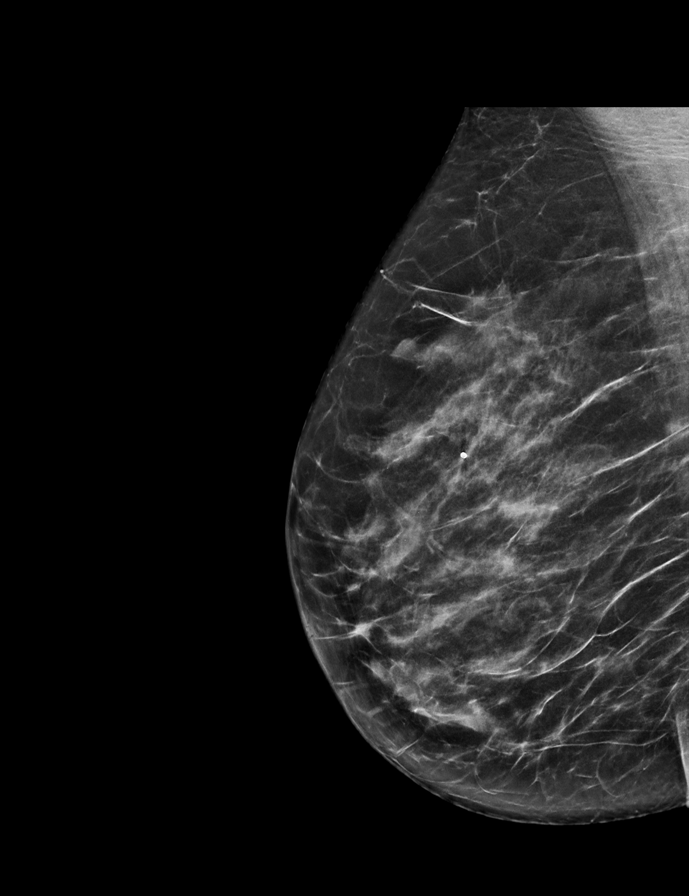

[L CC tomo · 2 of 62 frames shown]
[frame 21/62]
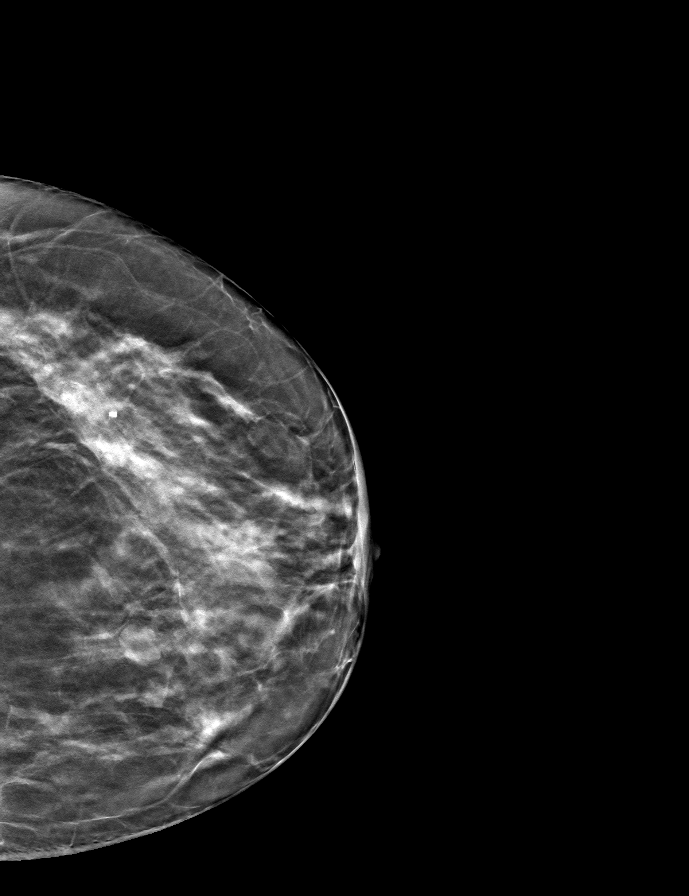
[frame 31/62]
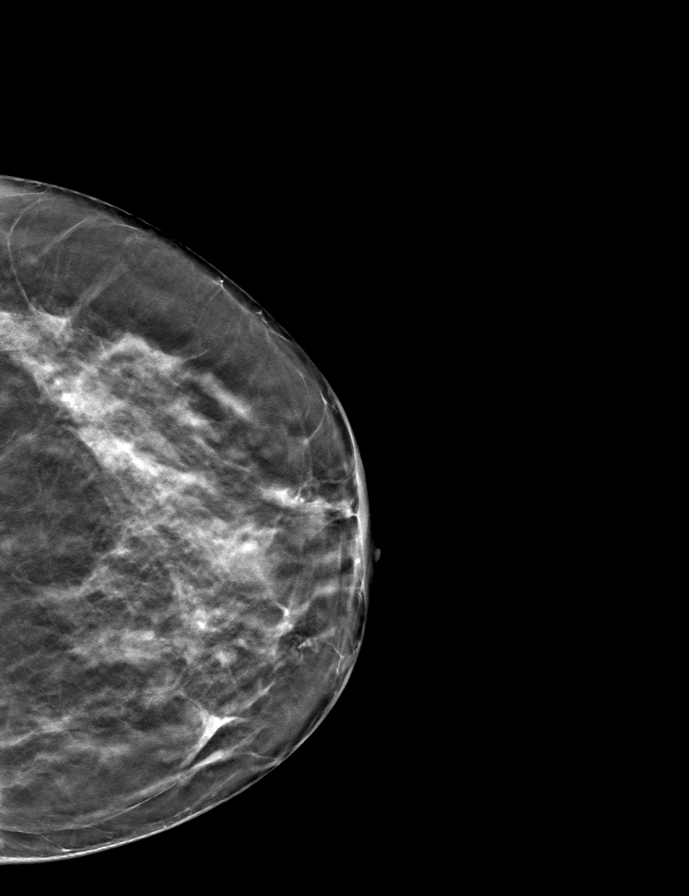

[R CC tomo · tomo slice 31/62.0]
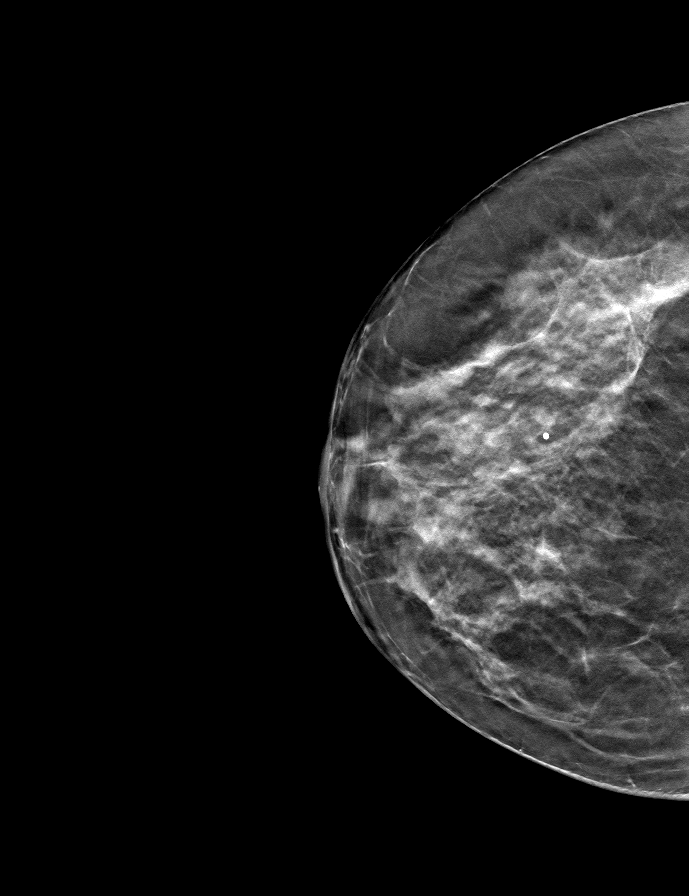

[R MLO tomo · tomo slice 31/61.0]
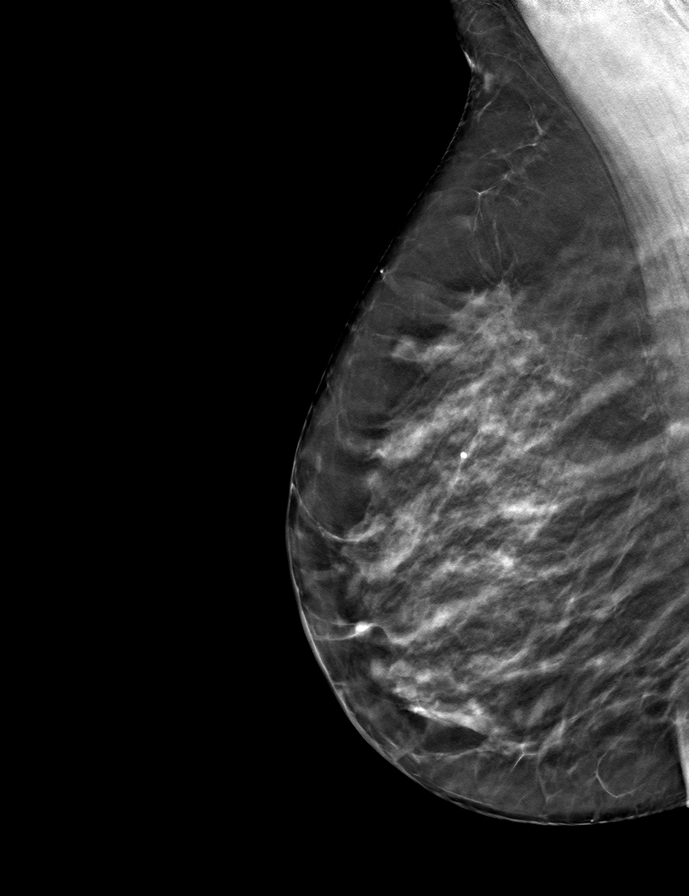

[L MLO tomo · tomo slice 33/66.0]
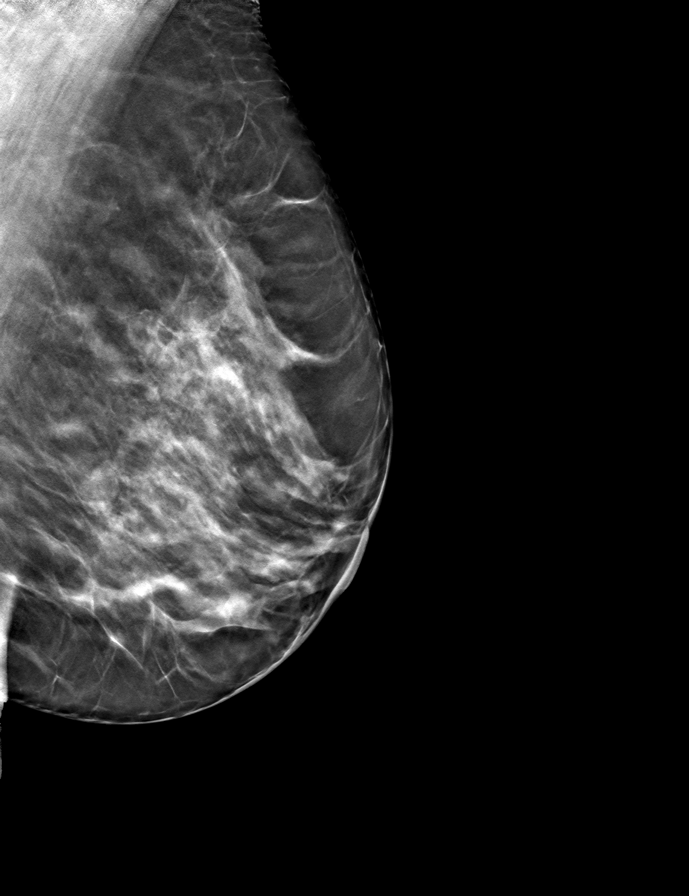

[9 of 24 positions shown; findings below may reference images not displayed]

ACR Breast Density Category c: The breast tissue is heterogeneously
dense, which may obscure small masses.
FINDINGS: There are no findings suspicious for malignancy. Images were
processed with CAD.
IMPRESSION: No mammographic evidence of malignancy. A result letter of this
screening mammogram will be mailed directly to the patient.

RECOMMENDATION:
Screening mammogram in one year. (Code:[id number])

BI-RADS CATEGORY  1: Negative.

## 2020-07-29 ENCOUNTER — Other Ambulatory Visit: Payer: Self-pay | Admitting: Internal Medicine

## 2020-09-20 ENCOUNTER — Other Ambulatory Visit: Payer: Self-pay | Admitting: Obstetrics & Gynecology

## 2020-09-20 ENCOUNTER — Telehealth: Payer: Self-pay | Admitting: Internal Medicine

## 2020-09-20 DIAGNOSIS — Z1231 Encounter for screening mammogram for malignant neoplasm of breast: Secondary | ICD-10-CM

## 2020-09-20 DIAGNOSIS — E785 Hyperlipidemia, unspecified: Secondary | ICD-10-CM

## 2020-09-20 DIAGNOSIS — Z79899 Other long term (current) drug therapy: Secondary | ICD-10-CM

## 2020-09-20 DIAGNOSIS — I10 Essential (primary) hypertension: Secondary | ICD-10-CM

## 2020-09-20 DIAGNOSIS — I77810 Thoracic aortic ectasia: Secondary | ICD-10-CM

## 2020-09-20 NOTE — Telephone Encounter (Signed)
nw message: \    Patient would like to have a order for labs before apt.

## 2020-09-20 NOTE — Telephone Encounter (Signed)
Fay Records, MD  08/13/2019 12:13 PM EDT      CBC is normal Electrolytes and kidney function are OK  Total cholesterol 206  LDL 136 It has been better in the past  HDL good at 50 Watch saturated fats     Vit D is very good at 49   Would f/u lipids in 1 year     Pt is calling to schedule her yearly lab appt to be done prior to her one year office visit with [place] on 6/6 to see Laurann [facility] Extender.  Per note copied from pts previous lab results by Dr. Harrington Challenger, she wanted her to have repeat lipids done in one year.  Pt had full lab panel done by her PCP, back in Nov 2021.  Called and spoke with the pt and she would like to come in to have her fasting lipids checked on 5/24, then see [place] for visit on 6/6.  Lab appt made for 5/24 to check lipids, as advised by Dr. Harrington Challenger above.  Pt is aware to come fasting to this lab appointment.  Pt verbalized understanding and agrees with this plan.

## 2020-10-04 ENCOUNTER — Telehealth: Payer: Self-pay

## 2020-10-04 NOTE — Telephone Encounter (Signed)
Yes thanks- im accepting family members of current patients

## 2020-10-04 NOTE — Telephone Encounter (Addendum)
Patient is calling in wanting to do a TOC to Dr.Hunter from [place] as Dr.Hunter currently sees her son Narcissa Melder) and her sister Vilma Prader). Okay to do TOC?

## 2020-10-12 ENCOUNTER — Other Ambulatory Visit: Payer: Self-pay

## 2020-10-12 ENCOUNTER — Other Ambulatory Visit: Payer: BC Managed Care – PPO | Admitting: *Deleted

## 2020-10-12 DIAGNOSIS — I10 Essential (primary) hypertension: Secondary | ICD-10-CM

## 2020-10-12 DIAGNOSIS — I77810 Thoracic aortic ectasia: Secondary | ICD-10-CM

## 2020-10-12 DIAGNOSIS — Z79899 Other long term (current) drug therapy: Secondary | ICD-10-CM

## 2020-10-12 DIAGNOSIS — E785 Hyperlipidemia, unspecified: Secondary | ICD-10-CM | POA: Diagnosis not present

## 2020-10-12 LAB — LIPID PANEL
Chol/HDL Ratio: 3.7 ratio (ref 0.0–4.4)
Cholesterol, Total: 205 mg/dL — ABNORMAL HIGH (ref 100–199)
HDL: 55 mg/dL (ref >39)
LDL Chol Calc (NIH): 135 mg/dL — ABNORMAL HIGH (ref 0–99)
Triglycerides: 86 mg/dL (ref 0–149)
VLDL Cholesterol Cal: 15 mg/dL (ref 5–40)

## 2020-10-25 ENCOUNTER — Ambulatory Visit: Payer: BC Managed Care – PPO | Admitting: Family

## 2020-11-04 ENCOUNTER — Ambulatory Visit: Payer: BC Managed Care – PPO | Admitting: Family

## 2020-11-04 ENCOUNTER — Other Ambulatory Visit: Payer: Self-pay

## 2020-11-04 ENCOUNTER — Encounter: Payer: Self-pay | Admitting: Family

## 2020-11-04 VITALS — BP 130/82 | HR 104 | Ht 62.0 in | Wt 129.6 lb

## 2020-11-04 DIAGNOSIS — I77810 Thoracic aortic ectasia: Secondary | ICD-10-CM | POA: Diagnosis not present

## 2020-11-04 DIAGNOSIS — I712 Thoracic aortic aneurysm, without rupture, unspecified: Secondary | ICD-10-CM

## 2020-11-04 DIAGNOSIS — Z79899 Other long term (current) drug therapy: Secondary | ICD-10-CM

## 2020-11-04 DIAGNOSIS — E785 Hyperlipidemia, unspecified: Secondary | ICD-10-CM

## 2020-11-04 DIAGNOSIS — Z789 Other specified health status: Secondary | ICD-10-CM

## 2020-11-04 DIAGNOSIS — I10 Essential (primary) hypertension: Secondary | ICD-10-CM | POA: Diagnosis not present

## 2020-11-04 MED ORDER — EZETIMIBE 10 MG PO TABS: 10.0000 mg | ORAL_TABLET | Freq: Every day | ORAL | 2 refills | Status: DC

## 2020-11-04 NOTE — Patient Instructions (Addendum)
Medication Instructions:  Your physician has recommended you make the following change in your medication:   START Zetia 10 mg daily (when you get back from your trip)  *If you need a refill on your cardiac medications before your next appointment, please call your pharmacy*   Lab Work: None ordered today.   Testing/Procedures: Your EKG today shows sinus tachycardia which is a regular heart rhythm.   Recommend having an MRA to assess your thoracic aorta in 1 year.  Follow-Up: At [facility], you and your health needs are our priority.  As part of our continuing mission to provide you with exceptional heart care, we have created designated Provider Care Teams.  These Care Teams include your primary Cardiologist (physician) and Advanced Practice Providers (APPs -  Physician Assistants and Nurse Practitioners) who all work together to provide you with the care you need, when you need it.  We recommend signing up for the patient portal called "MyChart".  Sign up information is provided on this After Visit Summary.  MyChart is used to connect with patients for Virtual Visits (Telemedicine).  Patients are able to view lab/test results, encounter notes, upcoming appointments, etc.  Non-urgent messages can be sent to your provider as well.   To learn more about what you can do with MyChart, go to [email or web address].    Your next appointment:   1 year(s)  The format for your next appointment:   In Person  Provider:   You may see Dorris Carnes, MD or one of the following Advanced Practice Providers on your designated Care Team:   Richardson Dopp, PA-C [place], [place]   Other Instructions  Heart Healthy Diet Recommendations: A low-salt diet is recommended. Meats should be grilled, baked, or boiled. Avoid fried foods. Focus on lean protein sources like fish or chicken with vegetables and fruits. The American Heart Association is a Microbiologist!  American Heart Association Diet and  Lifeystyle Recommendations   Exercise recommendations: The American Heart Association recommends 150 minutes of moderate intensity exercise weekly. Try 30 minutes of moderate intensity exercise 4-5 times per week. This could include walking, jogging, or swimming.

## 2020-11-04 NOTE — Progress Notes (Addendum)
Office Visit    Patient Name: Karen Salinas Date of Encounter: 06/27/2021  PCP:  Marin Olp, [facility]  Cardiologist:  Dorris Carnes, MD  Advanced Practice Provider:  No care team member to display Electrophysiologist:  None   Chief Complaint    Karen Salinas is a 65 y.o. female with a hx of hyperlipidemia, mild thoracic aortic dilation, hypertension presents today for follow up of dyslipidemia and aortic dilation.    Past Medical History    Past Medical History:  Diagnosis Date   Hyperlipidemia    on meds   Hypertension    on meds   IBS (irritable bowel syndrome)    years ago- better later in lfe   Kidney stones    hx of   Low back pain    on and off through most of life- compression fracture, prior gymnast   Past Surgical History:  Procedure Laterality Date   BREAST CYST ASPIRATION     COLONOSCOPY  2012   Medoff-MAC-mira(exc)-normal- 10 yr recall   CYSTOSCOPY WITH STENT PLACEMENT Right 12/16/2012   for stone. Procedure: RIGHT URETERAL STENT PLACEMENT;  Surgeon: Dutch Gray, MD;  Location: [facility];  Service: Urology;  Laterality: Right;   CYSTOSCOPY/RETROGRADE/URETEROSCOPY Right 12/16/2012   Procedure: CYSTOSCOPY/RETROGRADE/URETEROSCOPY;  Surgeon: Dutch Gray, MD;  Location: [facility];  Service: Urology;  Laterality: Right;   DILATION AND CURETTAGE OF UTERUS     no heartbeat   INTERCOSTAL NERVE BLOCK Right 06/23/2021   Procedure: INTERCOSTAL NERVE BLOCK;  Surgeon: [name], MD;  Location: [place];  Service: Thoracic;  Laterality: Right;   LASIK     NODE DISSECTION Right 06/23/2021   Procedure: NODE DISSECTION;  Surgeon: [name], MD;  Location: [place];  Service: Thoracic;  Laterality: Right;   TOTAL VAGINAL HYSTERECTOMY  2002   TUBAL LIGATION     VIDEO BRONCHOSCOPY WITH ENDOBRONCHIAL NAVIGATION N/A 06/23/2021   Procedure: VIDEO BRONCHOSCOPY WITH ENDOBRONCHIAL NAVIGATION;  Surgeon: [name], MD;  Location:  [place];  Service: Thoracic;  Laterality: N/A;   XI ROBOTIC ASSISTED THORACOSCOPY- SEGMENTECTOMY Right 06/23/2021   Procedure: XI ROBOTIC ASSISTED THORACOSCOPY-RIGHT LOWER LOBE SUPERIOR SEGMENTECTOMY;  Surgeon: [name], MD;  Location: [place];  Service: Thoracic;  Laterality: Right;    Allergies  Allergies  Allergen Reactions   Shellfish Allergy Diarrhea    History of Present Illness    Karen Salinas is a 65 y.o. female with a hx of hyperlipidemia, mild thoracic aortic dilation, hypertension last seen 08/11/19.  MRA aorta 08/2019 with stable minor uncomplicated fusiform ectasia/aneurysm dilation of ascending thoracic aorta. Maximal diameter 3.8cm which was stable compared to previous. Dr. Harrington Challenger recommended to consider repeat study for monitoring in 2 years.  She presents today for follow-up.  She has been doing overall well.  Tells me she has a trip to [place] planned later this week in [place] in July.  She is very excited for both trips. Reports no shortness of breath nor dyspnea on exertion. Reports no chest pain, pressure, or tightness. No edema, orthopnea, PND. Reports no palpitations. Discussed hyperlipidemia and previous intolerance to statins. She is interested in alternate agent. Previously on Zetia in 2015, discontinued for unclear reason, no documented side effect and she does not recall side effects.   EKGs/Labs/Other Studies Reviewed:   The following studies were reviewed today:  MR angio chest 08/27/19 IMPRESSION: VASCULAR   Stable minor uncomplicated fusiform ectasia/aneurysm  dilatation of the ascending thoracic aorta. Maximal diameter 3.8 cm as before.   Recommend annual imaging followup by CTA or MRA. This recommendation follows 2010 ACCF/AHA/AATS/ACR/ASA/SCA/SCAI/SIR/STS/SVM Guidelines for the Diagnosis and Management of Patients with Thoracic Aortic Disease. Circulation. 2010; 121: J681-L572. Aortic aneurysm NOS (ICD10-I71.9)  EKG:  EKG is ordered today.  The  ekg ordered today demonstrates ST 104 bpm with no acute ST/T wave changes.   Recent Labs: 06/25/2021: ALT 42; BUN 13; Creatinine, Ser 0.88; Hemoglobin 12.4; Platelets 203; Potassium 3.8; Sodium 141  Recent Lipid Panel    Component Value Date/Time   CHOL 135 03/29/2021 0724   TRIG 80 03/29/2021 0724   HDL 50 03/29/2021 0724   CHOLHDL 2.7 03/29/2021 0724   CHOLHDL 4.4 03/22/2020 1013   VLDL 16.8 11/08/2018 1117   LDLCALC 69 03/29/2021 0724   LDLCALC 135 (H) 03/22/2020 1013   LDLDIRECT 155.9 06/12/2013 0837    Home Medications   Current Meds  Medication Sig   estradiol (VIVELLE-DOT) 0.05 MG/24HR patch Place 1 patch onto the skin 2 (two) times a week.   [DISCONTINUED] ezetimibe (ZETIA) 10 MG tablet Take 1 tablet (10 mg total) by mouth daily.   [DISCONTINUED] losartan (COZAAR) 25 MG tablet TAKE 1 TABLET BY MOUTH DAILY   [DISCONTINUED] losartan (COZAAR) 25 MG tablet Take 37.5 mg by mouth daily.     Review of Systems   All other systems reviewed and are otherwise negative except as noted above.  Physical Exam    VS:  BP 130/82   Pulse (!) 104   Ht _0  (1.575 m)   Wt 129 lb 9.6 oz (58.8 kg)   SpO2 99%   BMI 23.70 kg/m  , BMI Body mass index is 23.7 kg/m.  Wt Readings from Last 3 Encounters:  06/23/21 123 lb (55.8 kg)  06/21/21 125 lb 4.8 oz (56.8 kg)  06/09/21 123 lb (55.8 kg)     GEN: Well nourished, well developed, in no acute distress. HEENT: normal. Neck: Supple, no JVD, carotid bruits, or masses. Cardiac: RRR, no murmurs, rubs, or gallops. No clubbing, cyanosis, edema.  Radials/DP/PT 2+ and equal bilaterally.  Respiratory:  Respirations regular and unlabored, clear to auscultation bilaterally. GI: Soft, nontender, nondistended. MS: No deformity or atrophy. Skin: Warm and dry, no rash. Neuro:  Strength and sensation are intact. Psych: Normal affect.  Assessment & Plan    HTN - BP well controlled. Continue current antihypertensive regimen. Heart healthy diet  and regular cardiovascular exercise encouraged.   HLD - Previous intolerance to statin including rosuvastatin. Start Zetia 60m daily. LDL goal <100 in setting of thoracic aorta dilation.   Ascending aorta dilation / Thoracic aortic dilation - Thoracic aorta 330mby MRA 08/2019. Continue optimal BP control. HLD management, as above. Repeat MRA in 09/2020.  Disposition: Follow up in 1 year(s) with Dr. RoHarrington Challengerr APP   Signed, CaLoel DubonnetNP  [place]

## 2020-11-15 ENCOUNTER — Other Ambulatory Visit: Payer: Self-pay

## 2020-11-15 ENCOUNTER — Ambulatory Visit
Admission: RE | Admit: 2020-11-15 | Discharge: 2020-11-15 | Disposition: A | Discharge status: Home / Self Care | Payer: BC Managed Care – PPO | Source: Ambulatory Visit

## 2020-11-15 DIAGNOSIS — Z1231 Encounter for screening mammogram for malignant neoplasm of breast: Secondary | ICD-10-CM | POA: Diagnosis not present

## 2020-11-19 NOTE — Progress Notes (Signed)
Repeat labs ordered as requested by Dr. Harrington Challenger. Anticipate collection late August or early September. Will reach out to patient to schedule date for lab collection via MyChart message.   Karen Dubonnet, NP

## 2020-11-19 NOTE — Progress Notes (Signed)
Called pt   She is doing good  Saw she had a clinic visit with C Walker    Pt says her BP is good at home    I told her to bring cuff with her to next doctor's appt to confirm accurate Discussed lipds    She is going to [place]   Fine to start Zetia after    Will follow up with labs later this summer    Make sure lipomed with ApoB and Lp(a)

## 2020-11-19 NOTE — Addendum Note (Signed)
Addended by: Loel Dubonnet on: 11/19/2020 12:31 PM   Modules accepted: Orders

## 2020-11-26 DIAGNOSIS — L821 Other seborrheic keratosis: Secondary | ICD-10-CM | POA: Diagnosis not present

## 2020-11-26 DIAGNOSIS — Z86018 Personal history of other benign neoplasm: Secondary | ICD-10-CM | POA: Diagnosis not present

## 2020-11-26 DIAGNOSIS — L570 Actinic keratosis: Secondary | ICD-10-CM | POA: Diagnosis not present

## 2020-11-26 DIAGNOSIS — D485 Neoplasm of uncertain behavior of skin: Secondary | ICD-10-CM | POA: Diagnosis not present

## 2020-11-26 DIAGNOSIS — L814 Other melanin hyperpigmentation: Secondary | ICD-10-CM | POA: Diagnosis not present

## 2020-11-26 DIAGNOSIS — D225 Melanocytic nevi of trunk: Secondary | ICD-10-CM | POA: Diagnosis not present

## 2020-12-24 ENCOUNTER — Encounter (HOSPITAL_BASED_OUTPATIENT_CLINIC_OR_DEPARTMENT_OTHER): Payer: Self-pay

## 2021-01-06 ENCOUNTER — Encounter (HOSPITAL_BASED_OUTPATIENT_CLINIC_OR_DEPARTMENT_OTHER): Payer: Self-pay

## 2021-01-06 ENCOUNTER — Other Ambulatory Visit: Payer: Self-pay | Admitting: Internal Medicine

## 2021-01-21 ENCOUNTER — Other Ambulatory Visit: Payer: Self-pay

## 2021-01-21 ENCOUNTER — Other Ambulatory Visit: Payer: Medicare Other | Admitting: *Deleted

## 2021-01-21 DIAGNOSIS — I712 Thoracic aortic aneurysm, without rupture, unspecified: Secondary | ICD-10-CM

## 2021-01-21 DIAGNOSIS — E785 Hyperlipidemia, unspecified: Secondary | ICD-10-CM

## 2021-01-21 DIAGNOSIS — Z789 Other specified health status: Secondary | ICD-10-CM

## 2021-01-23 LAB — COMPREHENSIVE METABOLIC PANEL
ALT: 21 IU/L (ref 0–32)
AST: 15 IU/L (ref 0–40)
Albumin/Globulin Ratio: 2.9 — ABNORMAL HIGH (ref 1.2–2.2)
Albumin: 4.6 g/dL (ref 3.8–4.8)
Alkaline Phosphatase: 62 IU/L (ref 44–121)
BUN/Creatinine Ratio: 19 (ref 12–28)
BUN: 18 mg/dL (ref 8–27)
Bilirubin Total: 0.8 mg/dL (ref 0.0–1.2)
CO2: 24 mmol/L (ref 20–29)
Calcium: 9.7 mg/dL (ref 8.7–10.3)
Chloride: 103 mmol/L (ref 96–106)
Creatinine, Ser: 0.94 mg/dL (ref 0.57–1.00)
Globulin, Total: 1.6 g/dL (ref 1.5–4.5)
Glucose: 98 mg/dL (ref 65–99)
Potassium: 4.2 mmol/L (ref 3.5–5.2)
Sodium: 141 mmol/L (ref 134–144)
Total Protein: 6.2 g/dL (ref 6.0–8.5)
eGFR: 67 mL/min/{1.73_m2} (ref >59)

## 2021-01-23 LAB — NMR, LIPOPROFILE
Cholesterol, Total: 217 mg/dL — ABNORMAL HIGH (ref 100–199)
HDL Particle Number: 41.1 umol/L (ref >=30.5)
HDL-C: 68 mg/dL (ref >39)
LDL Particle Number: 1416 nmol/L — ABNORMAL HIGH (ref <1000)
LDL Size: 21.5 nm (ref >20.5)
LDL-C (NIH Calc): 132 mg/dL — ABNORMAL HIGH (ref 0–99)
LP-IR Score: 29 (ref <=45)
Small LDL Particle Number: 253 nmol/L (ref <=527)
Triglycerides: 94 mg/dL (ref 0–149)

## 2021-01-23 LAB — APOLIPOPROTEIN B: Apolipoprotein B: 100 mg/dL — ABNORMAL HIGH (ref <90)

## 2021-01-23 LAB — LIPOPROTEIN A (LPA): Lipoprotein (a): 226.8 nmol/L — ABNORMAL HIGH (ref <75.0)

## 2021-01-23 LAB — LIPID PANEL
Chol/HDL Ratio: 3.4 ratio (ref 0.0–4.4)
Cholesterol, Total: 219 mg/dL — ABNORMAL HIGH (ref 100–199)
HDL: 65 mg/dL (ref >39)
LDL Chol Calc (NIH): 137 mg/dL — ABNORMAL HIGH (ref 0–99)
Triglycerides: 95 mg/dL (ref 0–149)
VLDL Cholesterol Cal: 17 mg/dL (ref 5–40)

## 2021-01-28 ENCOUNTER — Other Ambulatory Visit: Payer: Self-pay | Admitting: Family

## 2021-01-28 DIAGNOSIS — E785 Hyperlipidemia, unspecified: Secondary | ICD-10-CM

## 2021-01-28 NOTE — Telephone Encounter (Signed)
Rx(s) sent to pharmacy electronically.  

## 2021-02-08 ENCOUNTER — Encounter (HOSPITAL_BASED_OUTPATIENT_CLINIC_OR_DEPARTMENT_OTHER): Payer: Self-pay

## 2021-02-08 DIAGNOSIS — E785 Hyperlipidemia, unspecified: Secondary | ICD-10-CM

## 2021-02-08 MED ORDER — PRAVASTATIN SODIUM 40 MG PO TABS: 40.0000 mg | ORAL_TABLET | Freq: Every evening | ORAL | 3 refills | Status: DC

## 2021-02-08 NOTE — Telephone Encounter (Signed)
Pt aware that pravastatin 40 mg daily has been sent to pharmacy and lab appointment for Nov 9 is scheduled. (Lipo B, lipids, liver panel per Lenna Sciara, PharmD.)

## 2021-03-17 ENCOUNTER — Other Ambulatory Visit: Payer: Self-pay

## 2021-03-17 ENCOUNTER — Ambulatory Visit (INDEPENDENT_AMBULATORY_CARE_PROVIDER_SITE_OTHER): Payer: Medicare Other | Admitting: Family Medicine

## 2021-03-17 ENCOUNTER — Encounter: Payer: Self-pay | Admitting: Family Medicine

## 2021-03-17 VITALS — BP 122/78 | HR 81 | Temp 98.1°F | Ht 62.01 in | Wt 127.4 lb

## 2021-03-17 DIAGNOSIS — Z0000 Encounter for general adult medical examination without abnormal findings: Secondary | ICD-10-CM | POA: Diagnosis not present

## 2021-03-17 DIAGNOSIS — E785 Hyperlipidemia, unspecified: Secondary | ICD-10-CM | POA: Diagnosis not present

## 2021-03-17 DIAGNOSIS — I10 Essential (primary) hypertension: Secondary | ICD-10-CM

## 2021-03-17 DIAGNOSIS — Z90710 Acquired absence of both cervix and uterus: Secondary | ICD-10-CM | POA: Diagnosis not present

## 2021-03-17 DIAGNOSIS — I7121 Aneurysm of the ascending aorta, without rupture: Secondary | ICD-10-CM | POA: Diagnosis not present

## 2021-03-17 LAB — CBC WITH DIFFERENTIAL/PLATELET
Basophils Absolute: 0 10*3/uL (ref 0.0–0.1)
Basophils Relative: 0.2 % (ref 0.0–3.0)
Eosinophils Absolute: 0 10*3/uL (ref 0.0–0.7)
Eosinophils Relative: 0.2 % (ref 0.0–5.0)
HCT: 44 % (ref 36.0–46.0)
Hemoglobin: 15.2 g/dL — ABNORMAL HIGH (ref 12.0–15.0)
Lymphocytes Relative: 22.4 % (ref 12.0–46.0)
Lymphs Abs: 2.1 10*3/uL (ref 0.7–4.0)
MCHC: 34.5 g/dL (ref 30.0–36.0)
MCV: 93.9 fl (ref 78.0–100.0)
Monocytes Absolute: 0.4 10*3/uL (ref 0.1–1.0)
Monocytes Relative: 4.6 % (ref 3.0–12.0)
Neutro Abs: 6.7 10*3/uL (ref 1.4–7.7)
Neutrophils Relative %: 72.6 % (ref 43.0–77.0)
Platelets: 270 10*3/uL (ref 150.0–400.0)
RBC: 4.69 Mil/uL (ref 3.87–5.11)
RDW: 12 % (ref 11.5–15.5)
WBC: 9.2 10*3/uL (ref 4.0–10.5)

## 2021-03-17 NOTE — Progress Notes (Signed)
Phone [phone]   Subjective:  Patient presents today for their annual follow-up-no physical as long Medicare only-and to establish care-prior patient of Dr. Elease Hashimoto. Chief complaint-noted.   See problem oriented charting- ROS- not needed for problem oriented follow up  The following were reviewed and entered/updated in epic: Past Medical History:  Diagnosis Date   Hyperlipidemia    IBS (irritable bowel syndrome)    years ago- better later in lfe   Kidney stones    x1 now gone   Low back pain    on and off through most of life- compression fracture, prior gymnast   Patient Active Problem List   Diagnosis Date Noted   Ascending aortic aneurysm 03/17/2021    Priority: High   Essential hypertension 03/16/2015    Priority: Medium    Hyperlipidemia 06/04/2007    Priority: Medium    History of total hysterectomy 03/17/2021    Priority: Low   Past Surgical History:  Procedure Laterality Date   ABDOMINAL HYSTERECTOMY     total   BREAST CYST ASPIRATION     COLONOSCOPY     CYSTOSCOPY WITH STENT PLACEMENT Right 12/16/2012   for stone. Procedure: RIGHT URETERAL STENT PLACEMENT;  Surgeon: Dutch Gray, MD;  [facility]: [facility];  Service: Urology;  Laterality: Right;   CYSTOSCOPY/RETROGRADE/URETEROSCOPY Right 12/16/2012   Procedure: CYSTOSCOPY/RETROGRADE/URETEROSCOPY;  Surgeon: Dutch Gray, MD;  [facility];  Service: Urology;  Laterality: Right;   DILATION AND CURETTAGE OF UTERUS     no heartbeat   eye surgery for vision     LASIK     TUBAL LIGATION      Family History  Problem Relation Age of Onset   Hypertension Mother        age 26 in 2022   Hypertension Father        unknown cause of death MI? 67   Hyperlipidemia Father    Hypertension Sister    Stroke Brother    Prostate cancer Brother    High Cholesterol Brother    Sleep apnea Brother        cpap   Ovarian cancer Maternal Grandmother        age 55   Stroke Paternal Grandmother    Prostate cancer Paternal  Grandfather     Medications- reviewed and updated Current Outpatient Medications  Medication Sig Dispense Refill   ALPRAZolam (XANAX) 0.5 MG tablet Take 0.5 mg by mouth 2 (two) times daily as needed.     bimatoprost (LATISSE) 0.03 % ophthalmic solution SMARTSIG:Sparingly In Eye(s) Daily     estradiol (VIVELLE-DOT) 0.05 MG/24HR patch Place 1 patch onto the skin. Two times per week Tues and Saturdays     ezetimibe (ZETIA) 10 MG tablet TAKE 1 TABLET(10 MG) BY MOUTH DAILY 90 tablet 2   losartan (COZAAR) 25 MG tablet TAKE 1 TABLET(25 MG) BY MOUTH IN THE MORNING AND AT BEDTIME 135 tablet 1   pravastatin (PRAVACHOL) 40 MG tablet Take 1 tablet (40 mg total) by mouth every evening. 90 tablet 3   tretinoin (RETIN-A) 0.05 % cream SMARTSIG:Topical Every Evening     No current facility-administered medications for this visit.    Allergies-reviewed and updated Allergies  Allergen Reactions   Shellfish Allergy Nausea And Vomiting    Social History   Social History Narrative   Lives with long term boyfriend (Gershon Mussel) for 12 years in 2022. Son Marylyn Ishihara (Dr. Yong Channel patient) and daughter med school [employer].    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 Oropharynx normal Neck: no thyromegaly CV: RRR no murmurs rubs or gallops Lungs: CTAB no crackles, wheeze, rhonchi Abdomen: soft/nontender/nondistended/normal bowel sounds. No rebound or guarding.  Ext: no edema Skin: warm, dry Neuro: grossly normal, moves all extremities, PERRLA   Assessment and Plan   65 y.o. female presenting for annual follow-up Health Maintenance counseling: 1. Anticipatory guidance: Patient  counseled regarding regular dental exams -q6 months, eye exams-just had one- was told every 2 years,  avoiding smoking and second hand smoke , limiting alcohol to 1 beverage per day , no illicit drugs .   2. Risk factor reduction:  Advised patient of need for regular exercise and diet rich and fruits and vegetables to reduce risk of heart attack and stroke. Exercise- excellent! See social history. Diet-reasonably healthy diet. Cutting out added sugars Wt Readings from Last 3 Encounters:  03/17/21 127 lb 6.4 oz (57.8 kg)  11/04/20 129 lb 9.6 oz (58.8 kg)  03/22/20 129 lb (58.5 kg)  3. Immunizations/screenings/ancillary studies- discussed Shingrix #2-had in [place], Omicron/Bivalent-has not had covid- but has had 2 moderna and then bivalent booster (team will add) , Prevnar 20-next visit (traveling tomorrow), and Flu shot-had a few weeks ago (team will add)  - otherwise up-to-date. Immunization History  Administered Date(s) Administered   Fluad Quad(high Dose 65+) 02/24/2021   H1N1 05/27/2008   Hepatitis A 11/10/2011   Influenza Split 03/09/2012, 02/20/2014, 03/08/2020   Influenza Whole 05/22/2005, 02/22/2008, 01/27/2009, 01/20/2010   Influenza-Unspecified 02/17/2015, 02/21/2016, 02/09/2017   Janssen (J&J) SARS-COV-2 Vaccination 07/26/2019   Moderna SARS-COV2 Booster Vaccination 03/23/2020, 09/14/2020, 01/30/2021   Pneumococcal Polysaccharide-23 03/09/2005   Td 05/22/2006   Tdap 10/13/2016   Typhoid Parenteral 11/10/2011   Zoster Recombinat (Shingrix) 10/17/2017   Zoster, Live 02/21/2016   4. Cervical cancer screening- last done 01/18/2015 with a 3-year repeat planned - overdue- due to total hysterectomy including cervix will d/c 5. Breast cancer screening-  breast exam with Dr. Nori Riis and mammogram done 11/15/2020 and every 2 years 6. Colon cancer screening - last done 04/03/2011 with a 10-year repeat planned- planning to do with [facility] soon in january 7. Skin cancer screening- Dr.  Renda Rolls- yearly dermatologist. advised regular sunscreen use. Denies worrisome, changing, or new skin lesions.  8. Birth control/STD check- Patient is monogamous and opts out of STD check as monogamous 9. Osteoporosis screening at 70-  had this year- waiting on results- was slightly better 10. Smoking associated screening - never smoker  Status of chronic or acute concerns   #hypertension S: medication: Losartan 25 mg twice daily listed- actually takes 37.5 mg once a day Home readings #s: 110s/70s BP Readings from Last 3 Encounters:  03/17/21 122/78  11/04/20 130/82  03/22/20 116/78  A/P: Controlled. Continue current medications.    #hyperlipidemia with history of prior statin intolerance- elevated lipoprotein A S: Medication:Pravastatin 40 mg daily, Zetia 10 mg daily  -prior issues with liver and myalgias- so far so good!  -calcium score 12/04/16 with score of 0 on coronary artery calcium scoring Lab  Results  Component Value Date   CHOL 219 (H) 01/21/2021   HDL 65 01/21/2021   LDLCALC 137 (H) 01/21/2021   LDLDIRECT 155.9 06/12/2013   TRIG 95 01/21/2021   CHOLHDL 3.4 01/21/2021   A/P: hyperlipidemia followed by cardiology due to statin myalgia- we are hoping that LFTs will remain normal - has upcoming labs and thankful no myalgias yet  #Mild thoracic aortic dilation/ascending aortic dilation-follows with Dr. Harrington Challenger S:Was noted-38 mm thoracic aorta MRA 09/09/2019-MRA repeat planned in 2 years A/P: has been stable and now on 2 year follow up- continue cardiology follow up    #hormone replacement- had hysterectomy around 40 - ca125 was positive- mild increase later and was given option of hysterectomy- still gets paps on cuff per Dr. Nori Riis. HRT since that time and still on that  #flight related anxiety - also gets alprazolam through Dr. Nori Riis   Recommended follow up: No follow-ups on file. Future Appointments  Date Time Provider [place]  03/29/2021  8:45 AM [facility]  [id number]   Lab/Order associations: NOT fasting   ICD-10-CM   1. Preventative health care  Z00.00     2. Hyperlipidemia, unspecified hyperlipidemia type  E78.5 CBC with Differential/Platelet    3. Essential hypertension  I10 CBC with Differential/Platelet    4. Aneurysm of ascending aorta without rupture  I71.21     5. History of total hysterectomy  Z90.710      Time Spent: 45 minutes of total time (1:41 PM- 2:26 PM) was spent on the date of the encounter performing the following actions: chart review prior to seeing the patient, obtaining history, performing a medically necessary exam, counseling on the treatment plan and health maintenance items, placing orders, and documenting in our EHR.   I,Harris Phan,acting as a Education administrator for Garret Reddish, MD.,have documented all relevant documentation on the behalf of Garret Reddish, MD,as directed by  Garret Reddish, MD while in the presence of Garret Reddish, MD.  I, Garret Reddish, MD, have reviewed all documentation for this visit. The documentation on 03/17/21 for the exam, diagnosis, procedures, and orders are all accurate and complete.    Return precautions advised.  Garret Reddish, MD

## 2021-03-17 NOTE — Patient Instructions (Addendum)
Health Maintenance Due  Topic Date Due   Pneumonia Vaccine 47+ Years old (2 - PCV) - Prevnar 20 at next visit.  03/09/2006   DEXA SCAN  - Please have Dr. Nori Riis fax our office a copy of your bone density scan results/information.  Never done   It was a pleasure to meet you today!  Please stop by lab before you go If you have mychart- we will send your results within 3 business days of [place] receiving them.  If you do not have mychart- we will call you about results within 5 business days of [place] receiving them.  *please also note that you will see labs on mychart as soon as they post. I will later go in and write notes on them- will say "notes from Dr. Yong Channel"  Recommended follow up: Return in about 1 year (around 03/17/2022) for follow up- or sooner if needed.

## 2021-03-28 ENCOUNTER — Encounter: Payer: Self-pay | Admitting: Gastroenterology

## 2021-03-29 ENCOUNTER — Other Ambulatory Visit: Payer: Self-pay

## 2021-03-29 ENCOUNTER — Encounter (HOSPITAL_BASED_OUTPATIENT_CLINIC_OR_DEPARTMENT_OTHER): Payer: Self-pay

## 2021-03-29 ENCOUNTER — Other Ambulatory Visit: Payer: Medicare Other | Admitting: *Deleted

## 2021-03-29 DIAGNOSIS — E785 Hyperlipidemia, unspecified: Secondary | ICD-10-CM

## 2021-03-30 ENCOUNTER — Other Ambulatory Visit: Payer: Self-pay | Admitting: Family

## 2021-03-30 ENCOUNTER — Other Ambulatory Visit: Payer: Self-pay

## 2021-03-30 DIAGNOSIS — E785 Hyperlipidemia, unspecified: Secondary | ICD-10-CM

## 2021-03-30 DIAGNOSIS — Z79899 Other long term (current) drug therapy: Secondary | ICD-10-CM

## 2021-03-30 LAB — SPECIMEN STATUS REPORT

## 2021-03-31 LAB — HEPATIC FUNCTION PANEL
ALT: 24 IU/L (ref 0–32)
AST: 19 IU/L (ref 0–40)
Albumin: 4.6 g/dL (ref 3.8–4.8)
Alkaline Phosphatase: 69 IU/L (ref 44–121)
Bilirubin Total: 0.8 mg/dL (ref 0.0–1.2)
Bilirubin, Direct: 0.18 mg/dL (ref 0.00–0.40)
Total Protein: 6.5 g/dL (ref 6.0–8.5)

## 2021-03-31 LAB — LIPID PANEL
Chol/HDL Ratio: 2.7 ratio (ref 0.0–4.4)
Cholesterol, Total: 135 mg/dL (ref 100–199)
HDL: 50 mg/dL (ref >39)
LDL Chol Calc (NIH): 69 mg/dL (ref 0–99)
Triglycerides: 80 mg/dL (ref 0–149)
VLDL Cholesterol Cal: 16 mg/dL (ref 5–40)

## 2021-03-31 LAB — APOLIPOPROTEIN B: Apolipoprotein B: 64 mg/dL (ref <90)

## 2021-04-04 LAB — NMR, LIPOPROFILE
Cholesterol, Total: 133 mg/dL (ref 100–199)
HDL Particle Number: 33.7 umol/L (ref >=30.5)
HDL-C: 53 mg/dL (ref >39)
LDL Particle Number: 971 nmol/L (ref <1000)
LDL Size: 21.1 nm (ref >20.5)
LDL-C (NIH Calc): 64 mg/dL (ref 0–99)
LP-IR Score: 27 (ref <=45)
Small LDL Particle Number: 337 nmol/L (ref <=527)
Triglycerides: 83 mg/dL (ref 0–149)

## 2021-04-04 LAB — SPECIMEN STATUS REPORT

## 2021-04-18 ENCOUNTER — Telehealth: Payer: Self-pay

## 2021-04-18 NOTE — Telephone Encounter (Signed)
error 

## 2021-05-12 ENCOUNTER — Telehealth: Payer: Medicare Other | Admitting: Physician Assistant

## 2021-05-12 DIAGNOSIS — U071 COVID-19: Secondary | ICD-10-CM

## 2021-05-12 MED ORDER — MOLNUPIRAVIR EUA 200MG CAPSULE: 4.0000 | ORAL_CAPSULE | Freq: Two times a day (BID) | ORAL | 0 refills | Status: DC

## 2021-05-12 MED ORDER — BENZONATATE 100 MG PO CAPS: 100.0000 mg | ORAL_CAPSULE | Freq: Three times a day (TID) | ORAL | 0 refills | Status: DC | PRN

## 2021-05-12 NOTE — Progress Notes (Signed)
Virtual Visit Consent   Karen Salinas, you are scheduled for a virtual visit with a [facility] provider today.     Just as with appointments in the office, your consent must be obtained to participate.  Your consent will be active for this visit and any virtual visit you may have with one of our providers in the next 365 days.     If you have a MyChart account, a copy of this consent can be sent to you electronically.  All virtual visits are billed to your insurance company just like a traditional visit in the office.    As this is a virtual visit, video technology does not allow for your provider to perform a traditional examination.  This may limit your provider's ability to fully assess your condition.  If your provider identifies any concerns that need to be evaluated in person or the need to arrange testing (such as labs, EKG, etc.), we will make arrangements to do so.     Although advances in technology are sophisticated, we cannot ensure that it will always work on either your end or our end.  If the connection with a video visit is poor, the visit may have to be switched to a telephone visit.  With either a video or telephone visit, we are not always able to ensure that we have a secure connection.     I need to obtain your verbal consent now.   Are you willing to proceed with your visit today?    Karen Salinas has provided verbal consent on 05/12/2021 for a virtual visit (video or telephone).   Mar Daring, PA-C   Date: 05/12/2021 10:13 AM   Virtual Visit via Video Note   I, Mar Daring, connected with  Karen Salinas  ([id number], 01/24/1956) on 05/12/21 at 10:00 AM EST by a video-enabled telemedicine application and verified that I am speaking with the correct person using two identifiers.  Location: Patient: Virtual Visit Location Patient: Home Provider: Virtual Visit Location Provider: Home Office   I discussed the limitations of evaluation and management by  telemedicine and the availability of in person appointments. The patient expressed understanding and agreed to proceed.    History of Present Illness: Karen Salinas is a 65 y.o. who identifies as a female who was assigned female at birth, and is being seen today for Covid 94.  HPI: URI  This is a new problem. Episode onset: Symptoms started couple of days ago; tested positive for covid 19 today. The problem has been gradually worsening. There has been no fever. Associated symptoms include congestion, coughing, headaches, rhinorrhea, sinus pain and a sore throat (scracthy, raspy). Pertinent negatives include no ear pain or plugged ear sensation. Treatments tried: tylenol, sudafed congestion. The treatment provided no relief.     Problems:  Patient Active Problem List   Diagnosis Date Noted   Ascending aortic aneurysm 03/17/2021   History of total hysterectomy 03/17/2021   Essential hypertension 03/16/2015   Hyperlipidemia 06/04/2007    Allergies:  Allergies  Allergen Reactions   Shellfish Allergy Nausea And Vomiting   Medications:  Current Outpatient Medications:    benzonatate (TESSALON) 100 MG capsule, Take 1 capsule (100 mg total) by mouth 3 (three) times daily as needed., Disp: 30 capsule, Rfl: 0   molnupiravir EUA (LAGEVRIO) 200 mg CAPS capsule, Take 4 capsules (800 mg total) by mouth 2 (two) times daily for 5 days., Disp: 40 capsule, Rfl: 0  ALPRAZolam (XANAX) 0.5 MG tablet, Take 0.5 mg by mouth 2 (two) times daily as needed., Disp: , Rfl:    bimatoprost (LATISSE) 0.03 % ophthalmic solution, SMARTSIG:Sparingly In Eye(s) Daily, Disp: , Rfl:    estradiol (VIVELLE-DOT) 0.05 MG/24HR patch, Place 1 patch onto the skin. Two times per week Tues and Saturdays, Disp: , Rfl:    ezetimibe (ZETIA) 10 MG tablet, TAKE 1 TABLET(10 MG) BY MOUTH DAILY, Disp: 90 tablet, Rfl: 2   losartan (COZAAR) 25 MG tablet, Take 1 tablet (25 mg total) by mouth 2 (two) times daily at 10 AM and 5 PM., Disp: 181  tablet, Rfl: 3   pravastatin (PRAVACHOL) 40 MG tablet, Take 1 tablet (40 mg total) by mouth every evening., Disp: 90 tablet, Rfl: 3   tretinoin (RETIN-A) 0.05 % cream, SMARTSIG:Topical Every Evening, Disp: , Rfl:   Observations/Objective: Patient is well-developed, well-nourished in no acute distress.  Resting comfortably at home.  Head is normocephalic, atraumatic.  No labored breathing.  Speech is clear and coherent with logical content.  Patient is alert and oriented at baseline.    Assessment and Plan: 1. COVID-19 - MyChart COVID-19 home monitoring program; Future - molnupiravir EUA (LAGEVRIO) 200 mg CAPS capsule; Take 4 capsules (800 mg total) by mouth 2 (two) times daily for 5 days.  Dispense: 40 capsule; Refill: 0 - benzonatate (TESSALON) 100 MG capsule; Take 1 capsule (100 mg total) by mouth 3 (three) times daily as needed.  Dispense: 30 capsule; Refill: 0  - Continue OTC symptomatic management of choice - Will send OTC vitamins and supplement information through AVS - Molnupiravir and tessalon prescribed - Patient enrolled in MyChart symptom monitoring - Push fluids - Rest as needed - Discussed return precautions and when to seek in-person evaluation, sent via AVS as well   Follow Up Instructions: I discussed the assessment and treatment plan with the patient. The patient was provided an opportunity to ask questions and all were answered. The patient agreed with the plan and demonstrated an understanding of the instructions.  A copy of instructions were sent to the patient via MyChart unless otherwise noted below.    The patient was advised to call back or seek an in-person evaluation if the symptoms worsen or if the condition fails to improve as anticipated.  Time:  I spent 12 minutes with the patient via telehealth technology discussing the above problems/concerns.    Mar Daring, PA-C

## 2021-05-12 NOTE — Patient Instructions (Signed)
Karen Salinas, thank you for joining Mar Daring, PA-C for today's virtual visit.  While this provider is not your primary care provider (PCP), if your PCP is located in our provider database this encounter information will be shared with them immediately following your visit.  Consent: (Patient) Karen Salinas provided verbal consent for this virtual visit at the beginning of the encounter.  Current Medications:  Current Outpatient Medications:    benzonatate (TESSALON) 100 MG capsule, Take 1 capsule (100 mg total) by mouth 3 (three) times daily as needed., Disp: 30 capsule, Rfl: 0   molnupiravir EUA (LAGEVRIO) 200 mg CAPS capsule, Take 4 capsules (800 mg total) by mouth 2 (two) times daily for 5 days., Disp: 40 capsule, Rfl: 0   ALPRAZolam (XANAX) 0.5 MG tablet, Take 0.5 mg by mouth 2 (two) times daily as needed., Disp: , Rfl:    bimatoprost (LATISSE) 0.03 % ophthalmic solution, SMARTSIG:Sparingly In Eye(s) Daily, Disp: , Rfl:    estradiol (VIVELLE-DOT) 0.05 MG/24HR patch, Place 1 patch onto the skin. Two times per week Tues and Saturdays, Disp: , Rfl:    ezetimibe (ZETIA) 10 MG tablet, TAKE 1 TABLET(10 MG) BY MOUTH DAILY, Disp: 90 tablet, Rfl: 2   losartan (COZAAR) 25 MG tablet, Take 1 tablet (25 mg total) by mouth 2 (two) times daily at 10 AM and 5 PM., Disp: 181 tablet, Rfl: 3   pravastatin (PRAVACHOL) 40 MG tablet, Take 1 tablet (40 mg total) by mouth every evening., Disp: 90 tablet, Rfl: 3   tretinoin (RETIN-A) 0.05 % cream, SMARTSIG:Topical Every Evening, Disp: , Rfl:    Medications ordered in this encounter:  Meds ordered this encounter  Medications   molnupiravir EUA (LAGEVRIO) 200 mg CAPS capsule    Sig: Take 4 capsules (800 mg total) by mouth 2 (two) times daily for 5 days.    Dispense:  40 capsule    Refill:  0    Order Specific Question:   Supervising Provider    Answer:   MILLER, BRIAN [[id number]]   benzonatate (TESSALON) 100 MG capsule    Sig: Take 1 capsule (100 mg  total) by mouth 3 (three) times daily as needed.    Dispense:  30 capsule    Refill:  0    Order Specific Question:   Supervising Provider    Answer:   Sabra Heck, [place]     *If you need refills on other medications prior to your next appointment, please contact your pharmacy*  Follow-Up: Call back or seek an in-person evaluation if the symptoms worsen or if the condition fails to improve as anticipated.  Other Instructions Molnupiravir Oral Capsules What is this medication? MOLNUPIRAVIR (mol nue pir a vir) treats COVID-19. It is an antiviral medication. It may decrease the risk of developing severe symptoms of COVID-19. It may also decrease the chance of going to the hospital. This medication is not approved by the FDA. The FDA has authorized emergency use of this medication during the COVID-19 pandemic. This medicine may be used for other purposes; ask your health care provider or pharmacist if you have questions. COMMON BRAND NAME(S): LAGEVRIO What should I tell my care team before I take this medication? They need to know if you have any of these conditions: Any allergies Any serious illness An unusual or allergic reaction to molnupiravir, other medications, foods, dyes, or preservatives Pregnant or trying to get pregnant Breast-feeding How should I use this medication? Take this medication by mouth with water.  Take it as directed on the prescription label at the same time every day. Do not cut, crush or chew this medication. Swallow the capsules whole. You can take it with or without food. If it upsets your stomach, take it with food. Take all of this medication unless your care team tells you to stop it early. Keep taking it even if you think you are better. Talk to your care team about the use of this medication in children. Special care may be needed. Overdosage: If you think you have taken too much of this medicine contact a poison control center or emergency room at  once. NOTE: This medicine is only for you. Do not share this medicine with others. What if I miss a dose? If you miss a dose, take it as soon as you can unless it is more than 10 hours late. If it is more than 10 hours late, skip the missed dose. Take the next dose at the normal time. Do not take extra or 2 doses at the same time to make up for the missed dose. What may interact with this medication? Interactions have not been studied. This list may not describe all possible interactions. Give your health care provider a list of all the medicines, herbs, non-prescription drugs, or dietary supplements you use. Also tell them if you smoke, drink alcohol, or use illegal drugs. Some items may interact with your medicine. What should I watch for while using this medication? Your condition will be monitored carefully while you are receiving this medication. Visit your care team for regular checkups. Tell your care team if your symptoms do not start to get better or if they get worse. Do not become pregnant while taking this medication. You may need a pregnancy test before starting this medication. Women must use a reliable form of birth control while taking this medication and for 4 days after stopping the medication. Women should inform their care team if they wish to become pregnant or think they might be pregnant. Men should not father a child while taking this medication and for 3 months after stopping it. There is potential for serious harm to an unborn child. Talk to your care team for more information. Do not breast-feed an infant while taking this medication and for 4 days after stopping the medication. What side effects may I notice from receiving this medication? Side effects that you should report to your care team as soon as possible: Allergic reactions--skin rash, itching, hives, swelling of the face, lips, tongue, or throat Side effects that usually do not require medical attention (report these  to your care team if they continue or are bothersome): Diarrhea Dizziness Nausea This list may not describe all possible side effects. Call your doctor for medical advice about side effects. You may report side effects to FDA at 1-800-FDA-1088. Where should I keep my medication? Keep out of the reach of children and pets. Store at room temperature between 20 and 25 degrees C (68 and 77 degrees F). Get rid of any unused medication after the expiration date. To get rid of medications that are no longer needed or have expired: Take the medication to a medication take-back program. Check with your pharmacy or law enforcement to find a location. If you cannot return the medication, check the label or package insert to see if the medication should be thrown out in the garbage or flushed down the toilet. If you are not sure, ask your care team. If it  is safe to put it in the trash, take the medication out of the container. Mix the medication with cat litter, dirt, coffee grounds, or other unwanted substance. Seal the mixture in a bag or container. Put it in the trash. NOTE: This sheet is a summary. It may not cover all possible information. If you have questions about this medicine, talk to your doctor, pharmacist, or health care provider.  2022 Elsevier/Gold Standard (2020-05-17 00:00:00)   10 Things You Can Do to Manage Your COVID-19 Symptoms at Home If you have possible or confirmed COVID-19 Stay home except to get medical care. Monitor your symptoms carefully. If your symptoms get worse, call your healthcare provider immediately. Get rest and stay hydrated. If you have a medical appointment, call the healthcare provider ahead of time and tell them that you have or may have COVID-19. For medical emergencies, call 911 and notify the dispatch personnel that you have or may have COVID-19. Cover your cough and sneezes with a tissue or use the inside of your elbow. Wash your hands often with soap and  water for at least 20 seconds or clean your hands with an alcohol-based hand sanitizer that contains at least 60% alcohol. As much as possible, stay in a specific room and away from other people in your home. Also, you should use a separate bathroom, if available. If you need to be around other people in or outside of the home, wear a mask. Avoid sharing personal items with other people in your household, like dishes, towels, and bedding. Clean all surfaces that are touched often, like counters, tabletops, and doorknobs. Use household cleaning sprays or wipes according to the label instructions. [email or web address] 12/05/2019 This information is not intended to replace advice given to you by your health care provider. Make sure you discuss any questions you have with your health care provider. Document Revised: 01/28/2021 Document Reviewed: 01/28/2021 Elsevier Patient Education  2022 [employer].    If you have been instructed to have an in-person evaluation today at a local Urgent Care facility, please use the link below. It will take you to a list of all of our available [facility], including address, phone number and hours of operation. Please do not delay care.  [facility]  If you or a family member do not have a primary care provider, use the link below to schedule a visit and establish care. When you choose a [facility] primary care physician or advanced practice provider, you gain a long-term partner in health. Find a Primary Care Provider  Learn more about 's in-office and virtual care options: [facility]

## 2021-05-17 ENCOUNTER — Ambulatory Visit (AMBULATORY_SURGERY_CENTER): Payer: Medicare Other

## 2021-05-17 ENCOUNTER — Other Ambulatory Visit: Payer: Self-pay

## 2021-05-17 VITALS — Ht 62.0 in | Wt 125.0 lb

## 2021-05-17 DIAGNOSIS — Z1211 Encounter for screening for malignant neoplasm of colon: Secondary | ICD-10-CM

## 2021-05-17 NOTE — Progress Notes (Signed)
Pre visit completed via phone call; Patient verified name, DOB, and address; No egg or soy allergy known to patient  No issues known to pt with past sedation with any surgeries or procedures Patient denies ever being told they had issues or difficulty with intubation  No FH of Malignant Hyperthermia Pt is not on diet pills Pt is not on home 02  Pt is not on blood thinners  Pt denies issues with constipation at this time;   No A fib or A flutter Pt is fully vaccinated for Covid x 2 + boosters NO PA's for preps discussed with pt in [facility] today  Discussed with pt there will be an out-of-pocket cost for prep and that varies from $0 to 70 + dollars - pt verbalized understanding  Due to the COVID-19 pandemic we are asking patients to follow certain guidelines in [place] and the [place]   Pt aware of COVID protocols and [place] guidelines    Patient tested positive for COVID on 05/12/2021 tx'd with anti-virals and last dose of antiviral on 05/16/2021; no longer having symptoms as of 05/17/2021.

## 2021-05-30 ENCOUNTER — Encounter: Payer: Self-pay | Admitting: Gastroenterology

## 2021-05-30 ENCOUNTER — Other Ambulatory Visit: Payer: Self-pay

## 2021-05-30 ENCOUNTER — Ambulatory Visit (AMBULATORY_SURGERY_CENTER): Payer: Medicare Other | Admitting: Gastroenterology

## 2021-05-30 VITALS — BP 117/63 | HR 65 | Temp 97.4°F | Resp 10 | Ht 62.0 in | Wt 125.0 lb

## 2021-05-30 DIAGNOSIS — Z1211 Encounter for screening for malignant neoplasm of colon: Secondary | ICD-10-CM

## 2021-05-30 MED ORDER — SODIUM CHLORIDE 0.9 % IV SOLN: 500.0000 mL | Freq: Once | INTRAVENOUS | Status: DC

## 2021-05-30 NOTE — Progress Notes (Signed)
[facility]  Called to room to assist during endoscopic procedure.  Patient ID and intended procedure confirmed with present staff. Received instructions for my participation in the procedure from the performing physician.  

## 2021-05-30 NOTE — Op Note (Signed)
[facility] Patient Name: Karen Salinas Procedure Date: 05/30/2021 7:23 AM MRN: [id number] Endoscopist: Mauri Pole , MD Age: 66 Referring MD:  Date of Birth: 01-Jul-1955 Gender: Female Account #: [id number] Procedure:                Colonoscopy Indications:              Screening for colorectal malignant neoplasm Medicines:                Monitored Anesthesia Care Procedure:                Pre-Anesthesia Assessment:                           - Prior to the procedure, a History and Physical                            was performed, and patient medications and                            allergies were reviewed. The patient's tolerance of                            previous anesthesia was also reviewed. The risks                            and benefits of the procedure and the sedation                            options and risks were discussed with the patient.                            All questions were answered, and informed consent                            was obtained. Prior Anticoagulants: The patient has                            taken no previous anticoagulant or antiplatelet                            agents. ASA Grade Assessment: II - A patient with                            mild systemic disease. After reviewing the risks                            and benefits, the patient was deemed in                            satisfactory condition to undergo the procedure.                           After obtaining informed consent, the colonoscope  was passed under direct vision. Throughout the                            procedure, the patient's blood pressure, pulse, and                            oxygen saturations were monitored continuously. The                            PCF-HQ190L Colonoscope was introduced through the                            anus and advanced to the the cecum, identified by                            appendiceal  orifice and ileocecal valve. The                            colonoscopy was performed without difficulty. The                            patient tolerated the procedure well. The quality                            of the bowel preparation was excellent. The                            ileocecal valve, appendiceal orifice, and rectum                            were photographed. Scope In: 8:21:25 AM Scope Out: 8:35:28 AM Scope Withdrawal Time: 0 hours 7 minutes 57 seconds  Total Procedure Duration: 0 hours 14 minutes 3 seconds  Findings:                 The perianal and digital rectal examinations were                            normal.                           A single localized non-bleeding erosion was found                            in the transverse colon.                           Scattered small-mouthed diverticula were found in                            the sigmoid colon. Peri-diverticular erythema was                            seen.  Non-bleeding external and internal hemorrhoids were                            found during retroflexion. The hemorrhoids were                            small.                           A few less than 5 mm scars were found in the                            rectum. The scar tissue was healthy in appearance. Complications:            No immediate complications. Estimated Blood Loss:     Estimated blood loss was minimal. Impression:               - A single erosion in the transverse colon.                           - Mild diverticulosis in the sigmoid colon.                            Peri-diverticular erythema was seen.                           - Non-bleeding external and internal hemorrhoids.                           - Scar in the rectum.                           - No specimens collected. Recommendation:           - Patient has a contact number available for                            emergencies. The signs and  symptoms of potential                            delayed complications were discussed with the                            patient. Return to normal activities tomorrow.                            Written discharge instructions were provided to the                            patient.                           - Resume previous diet.                           - Continue present medications.                           -  Repeat colonoscopy in 10 years for screening                            purposes.                           - No aspirin, ibuprofen, naproxen, or other                            non-steroidal anti-inflammatory drugs. Mauri Pole, MD 05/30/2021 8:39:59 AM This report has been signed electronically.

## 2021-05-30 NOTE — Progress Notes (Signed)
[facility] History and Physical   Primary Care Physician:  Marin Olp, MD   Reason for Procedure:  Colorectal cancer screening  Plan:    Screening colonoscopy with possible interventions as needed     HPI: Karen Salinas is a very pleasant 66 y.o. female here for screening colonoscopy. Denies any nausea, vomiting, abdominal pain, melena or bright red blood per rectum  The risks and benefits as well as alternatives of endoscopic procedure(s) have been discussed and reviewed. All questions answered. The patient agrees to proceed.    Past Medical History:  Diagnosis Date   Hyperlipidemia    on meds   Hypertension    on meds   IBS (irritable bowel syndrome)    years ago- better later in lfe   Kidney stones    hx of   Low back pain    on and off through most of life- compression fracture, prior gymnast    Past Surgical History:  Procedure Laterality Date   BREAST CYST ASPIRATION     COLONOSCOPY  2012   Medoff-MAC-mira(exc)-normal- 10 yr recall   CYSTOSCOPY WITH STENT PLACEMENT Right 12/16/2012   for stone. Procedure: RIGHT URETERAL STENT PLACEMENT;  Surgeon: Dutch Gray, MD;  Location: [facility];  Service: Urology;  Laterality: Right;   CYSTOSCOPY/RETROGRADE/URETEROSCOPY Right 12/16/2012   Procedure: CYSTOSCOPY/RETROGRADE/URETEROSCOPY;  Surgeon: Dutch Gray, MD;  Location: [facility];  Service: Urology;  Laterality: Right;   DILATION AND CURETTAGE OF UTERUS     no heartbeat   LASIK     TOTAL VAGINAL HYSTERECTOMY  2002   TUBAL LIGATION      Prior to Admission medications   Medication Sig Start Date End Date Taking? Authorizing Provider  estradiol (VIVELLE-DOT) 0.05 MG/24HR patch Place 1 patch onto the skin. Two times per week Tues and Saturdays   Yes [provider]  losartan (COZAAR) 25 MG tablet Take 1 tablet (25 mg total) by mouth 2 (two) times daily at 10 AM and 5 PM. 03/30/21  Yes Loel Dubonnet, NP  pravastatin (PRAVACHOL) 40 MG tablet Take  1 tablet (40 mg total) by mouth every evening. 02/08/21  Yes Fay Records, MD  tretinoin (RETIN-A) 0.05 % cream SMARTSIG:Topical Every Evening 11/27/20  Yes [provider]  ALPRAZolam Duanne Moron) 0.5 MG tablet Take 0.5 mg by mouth 2 (two) times daily as needed. 11/12/20   [provider]  bimatoprost (LATISSE) 0.03 % ophthalmic solution SMARTSIG:Sparingly In Eye(s) Daily Patient not taking: Reported on 05/17/2021 11/29/20   [provider]  ezetimibe (ZETIA) 10 MG tablet TAKE 1 TABLET(10 MG) BY MOUTH DAILY 01/28/21   Loel Dubonnet, NP    Current Outpatient Medications  Medication Sig Dispense Refill   estradiol (VIVELLE-DOT) 0.05 MG/24HR patch Place 1 patch onto the skin. Two times per week Tues and Saturdays     losartan (COZAAR) 25 MG tablet Take 1 tablet (25 mg total) by mouth 2 (two) times daily at 10 AM and 5 PM. 181 tablet 3   pravastatin (PRAVACHOL) 40 MG tablet Take 1 tablet (40 mg total) by mouth every evening. 90 tablet 3   tretinoin (RETIN-A) 0.05 % cream SMARTSIG:Topical Every Evening     ALPRAZolam (XANAX) 0.5 MG tablet Take 0.5 mg by mouth 2 (two) times daily as needed.     bimatoprost (LATISSE) 0.03 % ophthalmic solution SMARTSIG:Sparingly In Eye(s) Daily (Patient not taking: Reported on 05/17/2021)     ezetimibe (ZETIA) 10 MG tablet TAKE 1 TABLET(10 MG) BY  MOUTH DAILY 90 tablet 2   Current Facility-Administered Medications  Medication Dose Route Frequency Provider Last Rate Last Admin   0.9 %  sodium chloride infusion  500 mL Intravenous Once Abbigael Detlefsen, Venia Minks, MD        Allergies as of 05/30/2021 - Review Complete 05/30/2021  Allergen Reaction Noted   Shellfish allergy Diarrhea 12/13/2012   Shellfish-derived products Diarrhea 02/23/2021    Family History  Problem Relation Age of Onset   Hypertension Mother        age 18 in 2022   Hypertension Father        unknown cause of death MI? 50   Hyperlipidemia Father    Hypertension Sister     Colon polyps Brother 10   Stroke Brother    Prostate cancer Brother    High Cholesterol Brother    Sleep apnea Brother        cpap   Ovarian cancer Maternal Grandmother        age 20   Stroke Paternal Grandmother    Prostate cancer Paternal Grandfather    Colon cancer Neg Hx    Esophageal cancer Neg Hx    Rectal cancer Neg Hx    Stomach cancer Neg Hx     Social History   Socioeconomic History   Marital status: Divorced    Spouse name: Not on file   Number of children: Not on file   Years of education: Not on file   Highest education level: Not on file  Occupational History   Not on file  Tobacco Use   Smoking status: Never   Smokeless tobacco: Never  Vaping Use   Vaping Use: Never used  Substance and Sexual Activity   Alcohol use: Not Currently    Alcohol/week: 0.0 - 3.0 standard drinks    Comment: rare   Drug use: No   Sexual activity: Yes    Partners: Male    Comment: monogamous  Other Topics Concern   Not on file  Social History Narrative   Lives with long term boyfriend (Tom) for 12 years in 2022. Son Marylyn Ishihara (Dr. Yong Channel patient) and daughter med school [employer].    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  Physical Exam: Vital signs in last 24 hours: BP (!) 143/74    Pulse 68    Temp (!) 97.4 F (36.3 C) (Temporal)    Resp (!) 9    Ht _0  (1.575 m)    Wt 125 lb (56.7 kg)    SpO2 99%    BMI 22.86 kg/m  General:   Alert, NAD Lungs:  Clear .   Heart:  Regular rate and rhythm Abdomen:   Soft, nontender and nondistended. Neuro/Psych:  Alert and cooperative. Normal mood and affect. A and O x 3  Reviewed labs, radiology imaging, old records and pertinent past GI work up  Patient is appropriate for planned procedure(s) and anesthesia in  an ambulatory setting   K. Denzil Magnuson , MD [phone]

## 2021-05-30 NOTE — Patient Instructions (Signed)
Thank you for allowing [place] to care for you today! Recommend next screening colonoscopy in 10 years.  We will send a reminder. Resume previous diet and medications today. Use NSAIDS sparingly  ( Ibuprofen, Aleve, Naproxyn) Resume normal daily activities tomorrow, 05/31/21   YOU HAD AN ENDOSCOPIC PROCEDURE TODAY AT [facility]:   Refer to the procedure report that was given to you for any specific questions about what was found during the examination.  If the procedure report does not answer your questions, please call your gastroenterologist to clarify.  If you requested that your care partner not be given the details of your procedure findings, then the procedure report has been included in a sealed envelope for you to review at your convenience later.  YOU SHOULD EXPECT: Some feelings of bloating in the abdomen. Passage of more gas than usual.  Walking can help get rid of the air that was put into your GI tract during the procedure and reduce the bloating. If you had a lower endoscopy (such as a colonoscopy or flexible sigmoidoscopy) you may notice spotting of blood in your stool or on the toilet paper. If you underwent a bowel prep for your procedure, you may not have a normal bowel movement for a few days.  Please Note:  You might notice some irritation and congestion in your nose or some drainage.  This is from the oxygen used during your procedure.  There is no need for concern and it should clear up in a day or so.  SYMPTOMS TO REPORT IMMEDIATELY:  Following lower endoscopy (colonoscopy or flexible sigmoidoscopy):  Excessive amounts of blood in the stool  Significant tenderness or worsening of abdominal pains  Swelling of the abdomen that is new, acute  Fever of 100F or higher   For urgent or emergent issues, a gastroenterologist can be reached at any hour by calling [phone]. Do not use MyChart messaging for urgent concerns.    DIET:  We do recommend a small  meal at first, but then you may proceed to your regular diet.  Drink plenty of fluids but you should avoid alcoholic beverages for 24 hours.  ACTIVITY:  You should plan to take it easy for the rest of today and you should NOT DRIVE or use heavy machinery until tomorrow (because of the sedation medicines used during the test).    FOLLOW UP: Our staff will call the number listed on your records 48-72 hours following your procedure to check on you and address any questions or concerns that you may have regarding the information given to you following your procedure. If we do not reach you, we will leave a message.  We will attempt to reach you two times.  During this call, we will ask if you have developed any symptoms of COVID 19. If you develop any symptoms (ie: fever, flu-like symptoms, shortness of breath, cough etc.) before then, please call [phone].  If you test positive for Covid 19 in the 2 weeks post procedure, please call and report this information to [place].    If any biopsies were taken you will be contacted by phone or by letter within the next 1-3 weeks.  Please call us at [phone] if you have not heard about the biopsies in 3 weeks.    SIGNATURES/CONFIDENTIALITY: You and/or your care partner have signed paperwork which will be entered into your electronic medical record.  These signatures attest to the fact that that the information above on  your After Visit Summary has been reviewed and is understood.  Full responsibility of the confidentiality of this discharge information lies with you and/or your care-partner.

## 2021-05-30 NOTE — Progress Notes (Signed)
Vss nad transferred to pacu 

## 2021-06-01 ENCOUNTER — Telehealth: Payer: Self-pay | Admitting: *Deleted

## 2021-06-01 NOTE — Telephone Encounter (Signed)
°  Follow up Call-  Call back number 05/30/2021  Post procedure Call Back phone  # [phone]  Permission to leave phone message Yes  Some recent data might be hidden     Patient questions:  Do you have a fever, pain , or abdominal swelling? No. Pain Score  0 *  Have you tolerated food without any problems? Yes.    Have you been able to return to your normal activities? Yes.    Do you have any questions about your discharge instructions: Diet   No. Medications  No. Follow up visit  No.  Do you have questions or concerns about your Care? No.  Actions: * If pain score is 4 or above: No action needed, pain <4.  Have you developed a fever since your procedure? no  2.   Have you had an respiratory symptoms (SOB or cough) since your procedure? no  3.   Have you tested positive for COVID 19 since your procedure no  4.   Have you had any family members/close contacts diagnosed with the COVID 19 since your procedure?  no   If yes to any of these questions please route to Joylene John, RN and Joella Prince, RN

## 2021-06-06 ENCOUNTER — Other Ambulatory Visit: Payer: Self-pay

## 2021-06-06 ENCOUNTER — Emergency Department (HOSPITAL_BASED_OUTPATIENT_CLINIC_OR_DEPARTMENT_OTHER)
Admission: EM | Admit: 2021-06-06 | Discharge: 2021-06-06 | Disposition: A | Discharge status: Home / Self Care | Payer: Medicare Other | Attending: Emergency Medicine | Admitting: Emergency Medicine

## 2021-06-06 ENCOUNTER — Emergency Department (HOSPITAL_BASED_OUTPATIENT_CLINIC_OR_DEPARTMENT_OTHER): Payer: Medicare Other

## 2021-06-06 ENCOUNTER — Encounter (HOSPITAL_BASED_OUTPATIENT_CLINIC_OR_DEPARTMENT_OTHER): Payer: Self-pay

## 2021-06-06 DIAGNOSIS — Y92410 Unspecified street and highway as the place of occurrence of the external cause: Secondary | ICD-10-CM | POA: Diagnosis not present

## 2021-06-06 DIAGNOSIS — R0789 Other chest pain: Secondary | ICD-10-CM | POA: Diagnosis not present

## 2021-06-06 DIAGNOSIS — R911 Solitary pulmonary nodule: Secondary | ICD-10-CM | POA: Diagnosis not present

## 2021-06-06 DIAGNOSIS — R079 Chest pain, unspecified: Secondary | ICD-10-CM

## 2021-06-06 LAB — COMPREHENSIVE METABOLIC PANEL
ALT: 20 U/L (ref 0–44)
AST: 16 U/L (ref 15–41)
Albumin: 4.2 g/dL (ref 3.5–5.0)
Alkaline Phosphatase: 42 U/L (ref 38–126)
Anion gap: 8 (ref 5–15)
BUN: 17 mg/dL (ref 8–23)
CO2: 26 mmol/L (ref 22–32)
Calcium: 9.2 mg/dL (ref 8.9–10.3)
Chloride: 108 mmol/L (ref 98–111)
Creatinine, Ser: 0.87 mg/dL (ref 0.44–1.00)
GFR, Estimated: >60 mL/min (ref >60)
Glucose, Bld: 103 mg/dL — ABNORMAL HIGH (ref 70–99)
Potassium: 4.2 mmol/L (ref 3.5–5.1)
Sodium: 142 mmol/L (ref 135–145)
Total Bilirubin: 0.6 mg/dL (ref 0.3–1.2)
Total Protein: 6.4 g/dL — ABNORMAL LOW (ref 6.5–8.1)

## 2021-06-06 LAB — CBC WITH DIFFERENTIAL/PLATELET
Abs Immature Granulocytes: 0.01 10*3/uL (ref 0.00–0.07)
Basophils Absolute: 0 10*3/uL (ref 0.0–0.1)
Basophils Relative: 0 %
Eosinophils Absolute: 0 10*3/uL (ref 0.0–0.5)
Eosinophils Relative: 0 %
HCT: 42.2 % (ref 36.0–46.0)
Hemoglobin: 14.8 g/dL (ref 12.0–15.0)
Immature Granulocytes: 0 %
Lymphocytes Relative: 26 %
Lymphs Abs: 1.9 10*3/uL (ref 0.7–4.0)
MCH: 32.1 pg (ref 26.0–34.0)
MCHC: 35.1 g/dL (ref 30.0–36.0)
MCV: 91.5 fL (ref 80.0–100.0)
Monocytes Absolute: 0.4 10*3/uL (ref 0.1–1.0)
Monocytes Relative: 6 %
Neutro Abs: 4.9 10*3/uL (ref 1.7–7.7)
Neutrophils Relative %: 68 %
Platelets: 218 10*3/uL (ref 150–400)
RBC: 4.61 MIL/uL (ref 3.87–5.11)
RDW: 11.8 % (ref 11.5–15.5)
WBC: 7.2 10*3/uL (ref 4.0–10.5)
nRBC: 0 % (ref 0.0–0.2)

## 2021-06-06 MED ORDER — IOHEXOL 300 MG/ML  SOLN
80.0000 mL | Freq: Once | INTRAMUSCULAR | Status: AC | PRN
  Administered 2021-06-06: 80 mL via INTRAVENOUS
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
## 2021-06-06 NOTE — ED Notes (Signed)
Pt verbalizes understanding of discharge instructions. Opportunity for questioning and answers were provided. Pt discharged from ED to home.   ? ?

## 2021-06-06 NOTE — Discharge Instructions (Addendum)
Call your primary care doctor or specialist as discussed in the next 2-3 days.   Return immediately back to the ER if:  Your symptoms worsen within the next 12-24 hours. You develop new symptoms such as new fevers, persistent vomiting, new pain, shortness of breath, or new weakness or numbness, or if you have any other concerns.  

## 2021-06-09 ENCOUNTER — Encounter: Payer: Self-pay | Admitting: Thoracic Surgery (Cardiothoracic Vascular Surgery)

## 2021-06-09 ENCOUNTER — Other Ambulatory Visit: Payer: Self-pay | Admitting: Thoracic Surgery (Cardiothoracic Vascular Surgery)

## 2021-06-09 ENCOUNTER — Institutional Professional Consult (permissible substitution) (INDEPENDENT_AMBULATORY_CARE_PROVIDER_SITE_OTHER): Payer: Medicare Other | Admitting: Thoracic Surgery (Cardiothoracic Vascular Surgery)

## 2021-06-09 ENCOUNTER — Other Ambulatory Visit: Payer: Self-pay

## 2021-06-09 DIAGNOSIS — R911 Solitary pulmonary nodule: Secondary | ICD-10-CM | POA: Insufficient documentation
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
## 2021-06-10 ENCOUNTER — Other Ambulatory Visit: Payer: Self-pay | Admitting: *Deleted

## 2021-06-10 ENCOUNTER — Telehealth: Payer: Self-pay | Admitting: *Deleted

## 2021-06-10 DIAGNOSIS — R911 Solitary pulmonary nodule: Secondary | ICD-10-CM

## 2021-06-10 DIAGNOSIS — Z5181 Encounter for therapeutic drug level monitoring: Secondary | ICD-10-CM

## 2021-06-10 NOTE — Telephone Encounter (Signed)
Called patient to go over surgery details and she mentioned she was COVID + over Christmas break.  She took home tests only and she was still positive on 05/21/21.  Took another one first week of January and was negative.  Patient does not need a pre-op COVID swab and is all set for surgery with Dr. Roxan Hockey on 06/23/21.

## 2021-06-20 NOTE — Progress Notes (Signed)
Surgical Instructions    Your procedure is scheduled on Thursday, February 2nd, 2023.   Report to [facility] Main Entrance "A" at 06:00 A.M., then check in with the Admitting office.  Call this number if you have problems the morning of surgery:  [phone]   If you have any questions prior to your surgery date call [phone]: Open Monday-Friday 8am-4pm    Remember:  Do not eat or drink after midnight the night before your surgery    Take these medicines the morning of surgery with A SIP OF WATER:  ezetimibe (ZETIA)  ALPRAZolam (XANAX) - if needed    As of today, STOP taking any Aspirin (unless otherwise instructed by your surgeon) Aleve, Naproxen, Ibuprofen, Motrin, Advil, Goody's, BC's, all herbal medications, fish oil, and all vitamins.   The day of surgery:          Do not wear jewelry or makeup Do not wear lotions, powders, perfumes, or deodorant. Do not shave 48 hours prior to surgery.   Do not bring valuables to the hospital. Do not wear nail polish, gel polish, artificial nails, or any other type of covering on natural nails (fingers and toes) If you have artificial nails or gel coating that need to be removed by a nail salon, please have this removed prior to surgery. Artificial nails or gel coating may interfere with anesthesia's ability to adequately monitor your vital signs.              [facility] is not responsible for any belongings or valuables.  Do NOT Smoke (Tobacco/Vaping)  24 hours prior to your procedure  If you use a CPAP at night, you may bring your mask for your overnight stay.   Contacts, glasses, hearing aids, dentures or partials may not be worn into surgery, please bring cases for these belongings   For patients admitted to the hospital, discharge time will be determined by your treatment team.   Patients discharged the day of surgery will not be allowed to drive home, and someone needs to stay with them for 24 hours.  NO VISITORS WILL BE  ALLOWED IN PRE-OP WHERE PATIENTS ARE PREPPED FOR SURGERY.  ONLY 1 SUPPORT PERSON MAY BE PRESENT IN THE WAITING ROOM WHILE YOU ARE IN SURGERY.  IF YOU ARE TO BE ADMITTED, ONCE YOU ARE IN YOUR ROOM YOU WILL BE ALLOWED TWO (2) VISITORS. 1 (ONE) VISITOR MAY STAY OVERNIGHT BUT MUST ARRIVE TO THE ROOM BY 8pm.  Minor children may have two parents present. Special consideration for safety and communication needs will be reviewed on a case by case basis.  Special instructions:    Oral Hygiene is also important to reduce your risk of infection.  Remember - BRUSH YOUR TEETH THE MORNING OF SURGERY WITH YOUR REGULAR TOOTHPASTE   [facility]- Preparing For Surgery  Before surgery, you can play an important role. Because skin is not sterile, your skin needs to be as free of germs as possible. You can reduce the number of germs on your skin by washing with CHG (chlorahexidine gluconate) Soap before surgery.  CHG is an antiseptic cleaner which kills germs and bonds with the skin to continue killing germs even after washing.     Please do not use if you have an allergy to CHG or antibacterial soaps. If your skin becomes reddened/irritated stop using the CHG.  Do not shave (including legs and underarms) for at least 48 hours prior to first CHG shower. It is OK to  shave your face.  Please follow these instructions carefully.     Shower the NIGHT BEFORE SURGERY and the MORNING OF SURGERY with CHG Soap.   If you chose to wash your hair, wash your hair first as usual with your normal shampoo. After you shampoo, rinse your hair and body thoroughly to remove the shampoo.  Then [employer] and genitals (private parts) with your normal soap and rinse thoroughly to remove soap.  After that Use CHG Soap as you would any other liquid soap. You can apply CHG directly to the skin and wash gently with a scrungie or a clean washcloth.   Apply the CHG Soap to your body ONLY FROM THE NECK DOWN.  Do not use on open wounds or open  sores. Avoid contact with your eyes, ears, mouth and genitals (private parts). Wash Face and genitals (private parts)  with your normal soap.   Wash thoroughly, paying special attention to the area where your surgery will be performed.  Thoroughly rinse your body with warm water from the neck down.  DO NOT shower/wash with your normal soap after using and rinsing off the CHG Soap.  Pat yourself dry with a CLEAN TOWEL.  Wear CLEAN PAJAMAS to bed the night before surgery  Place CLEAN SHEETS on your bed the night before your surgery  DO NOT SLEEP WITH PETS.   Day of Surgery:  Take a shower with CHG soap. Wear Clean/Comfortable clothing the morning of surgery Do not apply any deodorants/lotions.   Remember to brush your teeth WITH YOUR REGULAR TOOTHPASTE.   Please read over the following fact sheets that you were given.

## 2021-06-21 ENCOUNTER — Encounter (HOSPITAL_COMMUNITY)
Admission: RE | Admit: 2021-06-21 | Discharge: 2021-06-21 | Disposition: A | Discharge status: Home / Self Care | Payer: Medicare Other | Source: Ambulatory Visit | Attending: Thoracic Surgery (Cardiothoracic Vascular Surgery) | Admitting: Thoracic Surgery (Cardiothoracic Vascular Surgery)

## 2021-06-21 ENCOUNTER — Ambulatory Visit (HOSPITAL_COMMUNITY)
Admission: RE | Admit: 2021-06-21 | Discharge: 2021-06-21 | Disposition: A | Discharge status: Home / Self Care | Payer: Medicare Other | Source: Ambulatory Visit | Attending: Thoracic Surgery (Cardiothoracic Vascular Surgery) | Admitting: Thoracic Surgery (Cardiothoracic Vascular Surgery)

## 2021-06-21 ENCOUNTER — Other Ambulatory Visit: Payer: Self-pay

## 2021-06-21 ENCOUNTER — Encounter (HOSPITAL_COMMUNITY): Payer: Self-pay

## 2021-06-21 DIAGNOSIS — Z01818 Encounter for other preprocedural examination: Secondary | ICD-10-CM | POA: Insufficient documentation

## 2021-06-21 DIAGNOSIS — R911 Solitary pulmonary nodule: Secondary | ICD-10-CM

## 2021-06-21 DIAGNOSIS — Z5181 Encounter for therapeutic drug level monitoring: Secondary | ICD-10-CM | POA: Insufficient documentation

## 2021-06-21 LAB — URINALYSIS, ROUTINE W REFLEX MICROSCOPIC
Bilirubin Urine: NEGATIVE
Glucose, UA: NEGATIVE mg/dL
Hgb urine dipstick: NEGATIVE
Ketones, ur: NEGATIVE mg/dL
Leukocytes,Ua: NEGATIVE
Nitrite: NEGATIVE
Protein, ur: NEGATIVE mg/dL
Specific Gravity, Urine: <1.005 — ABNORMAL LOW (ref 1.005–1.030)
pH: 7 (ref 5.0–8.0)

## 2021-06-21 LAB — COMPREHENSIVE METABOLIC PANEL
ALT: 29 U/L (ref 0–44)
AST: 23 U/L (ref 15–41)
Albumin: 4.1 g/dL (ref 3.5–5.0)
Alkaline Phosphatase: 51 U/L (ref 38–126)
Anion gap: 9 (ref 5–15)
BUN: 12 mg/dL (ref 8–23)
CO2: 21 mmol/L — ABNORMAL LOW (ref 22–32)
Calcium: 9.4 mg/dL (ref 8.9–10.3)
Chloride: 110 mmol/L (ref 98–111)
Creatinine, Ser: 0.86 mg/dL (ref 0.44–1.00)
GFR, Estimated: >60 mL/min (ref >60)
Glucose, Bld: 106 mg/dL — ABNORMAL HIGH (ref 70–99)
Potassium: 4 mmol/L (ref 3.5–5.1)
Sodium: 140 mmol/L (ref 135–145)
Total Bilirubin: 1 mg/dL (ref 0.3–1.2)
Total Protein: 6.6 g/dL (ref 6.5–8.1)

## 2021-06-21 LAB — BLOOD GAS, ARTERIAL
Acid-base deficit: 1.7 mmol/L (ref 0.0–2.0)
Bicarbonate: 22 mmol/L (ref 20.0–28.0)
Drawn by: 60286
FIO2: 21
O2 Saturation: 97.2 %
Patient temperature: 37
pCO2 arterial: 33.8 mmHg (ref 32.0–48.0)
pH, Arterial: 7.429 (ref 7.350–7.450)
pO2, Arterial: 89.5 mmHg (ref 83.0–108.0)

## 2021-06-21 LAB — TYPE AND SCREEN
ABO/RH(D): A POS
Antibody Screen: NEGATIVE

## 2021-06-21 LAB — PROTIME-INR
INR: 0.9 (ref 0.8–1.2)
Prothrombin Time: 12.1 seconds (ref 11.4–15.2)

## 2021-06-21 LAB — CBC
HCT: 44.9 % (ref 36.0–46.0)
Hemoglobin: 15.4 g/dL — ABNORMAL HIGH (ref 12.0–15.0)
MCH: 31.6 pg (ref 26.0–34.0)
MCHC: 34.3 g/dL (ref 30.0–36.0)
MCV: 92 fL (ref 80.0–100.0)
Platelets: 276 10*3/uL (ref 150–400)
RBC: 4.88 MIL/uL (ref 3.87–5.11)
RDW: 11.9 % (ref 11.5–15.5)
WBC: 5.5 10*3/uL (ref 4.0–10.5)
nRBC: 0 % (ref 0.0–0.2)

## 2021-06-21 LAB — SURGICAL PCR SCREEN
MRSA, PCR: NEGATIVE
Staphylococcus aureus: NEGATIVE

## 2021-06-21 LAB — APTT: aPTT: 27 seconds (ref 24–36)

## 2021-06-21 LAB — GLUCOSE, CAPILLARY: Glucose-Capillary: 107 mg/dL — ABNORMAL HIGH (ref 70–99)

## 2021-06-21 MED ORDER — FLUDEOXYGLUCOSE F - 18 (FDG) INJECTION
6.1000 | Freq: Once | INTRAVENOUS | Status: AC
  Administered 2021-06-21: 6.01 via INTRAVENOUS

## 2021-06-21 NOTE — Progress Notes (Signed)
PCP - Brayton Mars. Hunter,MD Cardiologist - Nevin Bloodgood Ross,MD  PPM/ICD - denies Device Orders -  Rep Notified -   Chest x-ray - 06/21/21 EKG -  Stress Test -  ECHO -  Cardiac Cath -   Sleep Study -denies  CPAP -   Fasting Blood Sugar n/a-  Checks Blood Sugar _____ times a day  Blood Thinner Instructions:n/a Aspirin Instructions:n/a  ERAS Protcol -no PRE-SURGERY Ensure or G2-   COVID TEST- Pt tested positive at home 05/21/21. No Covid test done in PAT.   Anesthesia review:yes    Patient denies shortness of breath, fever, cough and chest pain at PAT appointment   All instructions explained to the patient, with a verbal understanding of the material. Patient agrees to go over the instructions while at home for a better understanding. Patient also instructed to self quarantine after being tested for COVID-19. The opportunity to ask questions was provided.

## 2021-06-22 ENCOUNTER — Ambulatory Visit (HOSPITAL_COMMUNITY)
Admission: RE | Admit: 2021-06-22 | Discharge: 2021-06-22 | Disposition: A | Discharge status: Home / Self Care | Payer: Medicare Other | Source: Ambulatory Visit | Attending: Thoracic Surgery (Cardiothoracic Vascular Surgery) | Admitting: Thoracic Surgery (Cardiothoracic Vascular Surgery)

## 2021-06-22 DIAGNOSIS — R911 Solitary pulmonary nodule: Secondary | ICD-10-CM | POA: Insufficient documentation

## 2021-06-22 LAB — PULMONARY FUNCTION TEST
DL/VA % pred: 95 %
DL/VA: 4.06 ml/min/mmHg/L
DLCO cor % pred: 92 %
DLCO cor: 17.19 ml/min/mmHg
DLCO unc % pred: 98 %
DLCO unc: 18.16 ml/min/mmHg
FEF 25-75 Pre: 2.37 L/sec
FEF2575-%Pred-Pre: 117 %
FEV1-%Pred-Pre: 108 %
FEV1-Pre: 2.43 L
FEV1FVC-%Pred-Pre: 104 %
FEV6-%Pred-Pre: 107 %
FEV6-Pre: 3.01 L
FEV6FVC-%Pred-Pre: 104 %
FVC-%Pred-Pre: 103 %
FVC-Pre: 3.01 L
Pre FEV1/FVC ratio: 81 %
Pre FEV6/FVC Ratio: 100 %
RV % pred: 87 %
RV: 1.73 L
TLC % pred: 104 %
TLC: 4.98 L

## 2021-06-23 ENCOUNTER — Inpatient Hospital Stay (HOSPITAL_COMMUNITY)
Admission: RE | Admit: 2021-06-23 | Discharge: 2021-06-25 | DRG: 164 | Disposition: A | Discharge status: Home / Self Care | Payer: Medicare Other | Attending: Thoracic Surgery (Cardiothoracic Vascular Surgery) | Admitting: Thoracic Surgery (Cardiothoracic Vascular Surgery)

## 2021-06-23 ENCOUNTER — Inpatient Hospital Stay (HOSPITAL_COMMUNITY): Payer: Medicare Other

## 2021-06-23 ENCOUNTER — Other Ambulatory Visit: Payer: Self-pay

## 2021-06-23 ENCOUNTER — Inpatient Hospital Stay (HOSPITAL_COMMUNITY): Payer: Medicare Other | Admitting: Certified Registered Nurse Anesthetist

## 2021-06-23 ENCOUNTER — Encounter (HOSPITAL_COMMUNITY): Payer: Self-pay | Admitting: Thoracic Surgery (Cardiothoracic Vascular Surgery)

## 2021-06-23 ENCOUNTER — Encounter (HOSPITAL_COMMUNITY)
Admission: RE | Disposition: A | Discharge status: Home / Self Care | Payer: Self-pay | Source: Home / Self Care | Attending: Thoracic Surgery (Cardiothoracic Vascular Surgery)

## 2021-06-23 DIAGNOSIS — J982 Interstitial emphysema: Secondary | ICD-10-CM | POA: Diagnosis present

## 2021-06-23 DIAGNOSIS — E785 Hyperlipidemia, unspecified: Secondary | ICD-10-CM | POA: Diagnosis present

## 2021-06-23 DIAGNOSIS — Z8041 Family history of malignant neoplasm of ovary: Secondary | ICD-10-CM | POA: Diagnosis not present

## 2021-06-23 DIAGNOSIS — Z8249 Family history of ischemic heart disease and other diseases of the circulatory system: Secondary | ICD-10-CM | POA: Diagnosis not present

## 2021-06-23 DIAGNOSIS — Z8616 Personal history of COVID-19: Secondary | ICD-10-CM | POA: Diagnosis not present

## 2021-06-23 DIAGNOSIS — J90 Pleural effusion, not elsewhere classified: Secondary | ICD-10-CM | POA: Diagnosis present

## 2021-06-23 DIAGNOSIS — C3431 Malignant neoplasm of lower lobe, right bronchus or lung: Principal | ICD-10-CM | POA: Diagnosis present

## 2021-06-23 DIAGNOSIS — I10 Essential (primary) hypertension: Secondary | ICD-10-CM | POA: Diagnosis present

## 2021-06-23 DIAGNOSIS — Z91013 Allergy to seafood: Secondary | ICD-10-CM | POA: Diagnosis not present

## 2021-06-23 DIAGNOSIS — Z823 Family history of stroke: Secondary | ICD-10-CM | POA: Diagnosis not present

## 2021-06-23 DIAGNOSIS — J9383 Other pneumothorax: Secondary | ICD-10-CM | POA: Diagnosis not present

## 2021-06-23 DIAGNOSIS — R911 Solitary pulmonary nodule: Secondary | ICD-10-CM | POA: Diagnosis present

## 2021-06-23 DIAGNOSIS — Z419 Encounter for procedure for purposes other than remedying health state, unspecified: Secondary | ICD-10-CM

## 2021-06-23 DIAGNOSIS — J939 Pneumothorax, unspecified: Secondary | ICD-10-CM

## 2021-06-23 HISTORY — PX: XI ROBOTIC ASSISTED THORACOSCOPY- SEGMENTECTOMY: SHX6881

## 2021-06-23 HISTORY — PX: INTERCOSTAL NERVE BLOCK: SHX5021

## 2021-06-23 HISTORY — PX: VIDEO BRONCHOSCOPY WITH ENDOBRONCHIAL NAVIGATION: SHX6175

## 2021-06-23 HISTORY — PX: NODE DISSECTION: SHX5269

## 2021-06-23 LAB — ABO/RH: ABO/RH(D): A POS

## 2021-06-23 SURGERY — RESECTION, LUNG, SEGMENTAL, ROBOT-ASSISTED
Anesthesia: General | Site: Chest | Laterality: Right

## 2021-06-23 MED ORDER — BUPIVACAINE LIPOSOME 1.3 % IJ SUSP
INTRAMUSCULAR | Status: AC
  Filled 2021-06-23: qty 20

## 2021-06-23 MED ORDER — LIDOCAINE 2% (20 MG/ML) 5 ML SYRINGE
INTRAMUSCULAR | Status: AC
  Filled 2021-06-23: qty 5

## 2021-06-23 MED ORDER — KETOROLAC TROMETHAMINE 15 MG/ML IJ SOLN
15.0000 mg | Freq: Four times a day (QID) | INTRAMUSCULAR | Status: DC
  Administered 2021-06-23 – 2021-06-24 (×5): 15 mg via INTRAVENOUS
  Filled 2021-06-23 (×5): qty 1

## 2021-06-23 MED ORDER — MIDAZOLAM HCL 2 MG/2ML IJ SOLN: 0.5000 mg | Freq: Once | INTRAMUSCULAR | Status: DC | PRN

## 2021-06-23 MED ORDER — INDOCYANINE GREEN 25 MG IV SOLR
INTRAVENOUS | Status: AC
  Filled 2021-06-23: qty 10

## 2021-06-23 MED ORDER — ONDANSETRON HCL 4 MG/2ML IJ SOLN: 4.0000 mg | Freq: Four times a day (QID) | INTRAMUSCULAR | Status: DC | PRN

## 2021-06-23 MED ORDER — METHYLENE BLUE 0.5 % INJ SOLN: INTRAVENOUS | Status: DC | PRN

## 2021-06-23 MED ORDER — OXYCODONE HCL 5 MG PO TABS
5.0000 mg | ORAL_TABLET | ORAL | Status: DC | PRN
  Administered 2021-06-24: 5 mg via ORAL
  Filled 2021-06-23: qty 1

## 2021-06-23 MED ORDER — ENOXAPARIN SODIUM 40 MG/0.4ML IJ SOSY
40.0000 mg | PREFILLED_SYRINGE | INTRAMUSCULAR | Status: DC
  Administered 2021-06-23 – 2021-06-24 (×2): 40 mg via SUBCUTANEOUS
  Filled 2021-06-23 (×2): qty 0.4

## 2021-06-23 MED ORDER — LACTATED RINGERS IV SOLN: INTRAVENOUS | Status: DC | PRN

## 2021-06-23 MED ORDER — MIDAZOLAM HCL 2 MG/2ML IJ SOLN
INTRAMUSCULAR | Status: DC | PRN
Start: 2021-06-23 — End: 2021-06-23
  Administered 2021-06-23: 2 mg via INTRAVENOUS

## 2021-06-23 MED ORDER — SENNOSIDES-DOCUSATE SODIUM 8.6-50 MG PO TABS
1.0000 | ORAL_TABLET | Freq: Every day | ORAL | Status: DC
  Administered 2021-06-23 – 2021-06-24 (×2): 1 via ORAL
  Filled 2021-06-23 (×2): qty 1

## 2021-06-23 MED ORDER — CEFAZOLIN SODIUM-DEXTROSE 2-4 GM/100ML-% IV SOLN
2.0000 g | Freq: Three times a day (TID) | INTRAVENOUS | Status: AC
  Administered 2021-06-23 (×2): 2 g via INTRAVENOUS
  Filled 2021-06-23 (×2): qty 100

## 2021-06-23 MED ORDER — METHYLENE BLUE 0.5 % INJ SOLN
INTRAVENOUS | Status: AC
  Filled 2021-06-23: qty 10

## 2021-06-23 MED ORDER — KCL IN DEXTROSE-NACL 20-5-0.45 MEQ/L-%-% IV SOLN
INTRAVENOUS | Status: DC
  Filled 2021-06-23 (×2): qty 1000

## 2021-06-23 MED ORDER — ACETAMINOPHEN 500 MG PO TABS
1000.0000 mg | ORAL_TABLET | Freq: Four times a day (QID) | ORAL | Status: DC
  Administered 2021-06-23 – 2021-06-25 (×7): 1000 mg via ORAL
  Filled 2021-06-23 (×7): qty 2

## 2021-06-23 MED ORDER — EZETIMIBE 10 MG PO TABS
10.0000 mg | ORAL_TABLET | Freq: Every day | ORAL | Status: DC
  Administered 2021-06-24 – 2021-06-25 (×2): 10 mg via ORAL
  Filled 2021-06-23 (×2): qty 1

## 2021-06-23 MED ORDER — BUPIVACAINE HCL (PF) 0.5 % IJ SOLN
INTRAMUSCULAR | Status: AC
  Filled 2021-06-23: qty 30

## 2021-06-23 MED ORDER — LABETALOL HCL 5 MG/ML IV SOLN
INTRAVENOUS | Status: DC | PRN
  Administered 2021-06-23 (×2): 5 mg via INTRAVENOUS

## 2021-06-23 MED ORDER — ACETAMINOPHEN 500 MG PO TABS
1000.0000 mg | ORAL_TABLET | Freq: Once | ORAL | Status: DC
Start: 2021-06-23 — End: 2021-06-23

## 2021-06-23 MED ORDER — FENTANYL CITRATE (PF) 250 MCG/5ML IJ SOLN
INTRAMUSCULAR | Status: DC | PRN
  Administered 2021-06-23: 50 ug via INTRAVENOUS
  Administered 2021-06-23: 25 ug via INTRAVENOUS
  Administered 2021-06-23: 50 ug via INTRAVENOUS
  Administered 2021-06-23: 200 ug via INTRAVENOUS
  Administered 2021-06-23: 50 ug via INTRAVENOUS

## 2021-06-23 MED ORDER — OXYCODONE HCL 5 MG PO TABS: 5.0000 mg | ORAL_TABLET | Freq: Once | ORAL | Status: DC | PRN

## 2021-06-23 MED ORDER — SODIUM CHLORIDE 0.9 % IR SOLN
Status: DC | PRN
  Administered 2021-06-23: 1000 mL

## 2021-06-23 MED ORDER — PRAVASTATIN SODIUM 40 MG PO TABS
40.0000 mg | ORAL_TABLET | Freq: Every evening | ORAL | Status: DC
  Administered 2021-06-23 – 2021-06-24 (×2): 40 mg via ORAL
  Filled 2021-06-23 (×2): qty 1

## 2021-06-23 MED ORDER — 0.9 % SODIUM CHLORIDE (POUR BTL) OPTIME
TOPICAL | Status: DC | PRN
  Administered 2021-06-23: 2000 mL

## 2021-06-23 MED ORDER — FENTANYL CITRATE (PF) 250 MCG/5ML IJ SOLN
INTRAMUSCULAR | Status: AC
  Filled 2021-06-23: qty 5

## 2021-06-23 MED ORDER — ROCURONIUM BROMIDE 10 MG/ML (PF) SYRINGE
PREFILLED_SYRINGE | INTRAVENOUS | Status: DC | PRN
  Administered 2021-06-23: 30 mg via INTRAVENOUS
  Administered 2021-06-23: 60 mg via INTRAVENOUS

## 2021-06-23 MED ORDER — FENTANYL CITRATE PF 50 MCG/ML IJ SOSY: 25.0000 ug | PREFILLED_SYRINGE | INTRAMUSCULAR | Status: DC | PRN

## 2021-06-23 MED ORDER — LIDOCAINE 2% (20 MG/ML) 5 ML SYRINGE
INTRAMUSCULAR | Status: DC | PRN
Start: 2021-06-23 — End: 2021-06-23
  Administered 2021-06-23: 20 mg via INTRAVENOUS

## 2021-06-23 MED ORDER — ACETAMINOPHEN 160 MG/5ML PO SOLN: 1000.0000 mg | Freq: Four times a day (QID) | ORAL | Status: DC

## 2021-06-23 MED ORDER — MIDAZOLAM HCL 2 MG/2ML IJ SOLN
INTRAMUSCULAR | Status: AC
  Filled 2021-06-23: qty 2

## 2021-06-23 MED ORDER — DEXAMETHASONE SODIUM PHOSPHATE 10 MG/ML IJ SOLN
INTRAMUSCULAR | Status: DC | PRN
Start: 2021-06-23 — End: 2021-06-23
  Administered 2021-06-23: 10 mg via INTRAVENOUS

## 2021-06-23 MED ORDER — OXYCODONE HCL 5 MG/5ML PO SOLN: 5.0000 mg | Freq: Once | ORAL | Status: DC | PRN

## 2021-06-23 MED ORDER — ROCURONIUM BROMIDE 10 MG/ML (PF) SYRINGE
PREFILLED_SYRINGE | INTRAVENOUS | Status: AC
  Filled 2021-06-23: qty 10

## 2021-06-23 MED ORDER — PHENYLEPHRINE HCL-NACL 20-0.9 MG/250ML-% IV SOLN
INTRAVENOUS | Status: DC | PRN
  Administered 2021-06-23: 25 ug/min via INTRAVENOUS

## 2021-06-23 MED ORDER — HEMOSTATIC AGENTS (NO CHARGE) OPTIME
TOPICAL | Status: DC | PRN
  Administered 2021-06-23: 2 via TOPICAL

## 2021-06-23 MED ORDER — PROPOFOL 10 MG/ML IV BOLUS
INTRAVENOUS | Status: DC | PRN
  Administered 2021-06-23: 150 mg via INTRAVENOUS

## 2021-06-23 MED ORDER — MEPERIDINE HCL 25 MG/ML IJ SOLN
6.2500 mg | INTRAMUSCULAR | Status: DC | PRN
  Administered 2021-06-23: 6.25 mg via INTRAVENOUS

## 2021-06-23 MED ORDER — SODIUM CHLORIDE FLUSH 0.9 % IV SOLN
INTRAVENOUS | Status: DC | PRN
  Administered 2021-06-23: 100 mL

## 2021-06-23 MED ORDER — LUNG SURGERY BOOK
Freq: Once | Status: AC
  Filled 2021-06-23: qty 1

## 2021-06-23 MED ORDER — MEPERIDINE HCL 25 MG/ML IJ SOLN
INTRAMUSCULAR | Status: AC
  Filled 2021-06-23: qty 1

## 2021-06-23 MED ORDER — CHLORHEXIDINE GLUCONATE 0.12 % MT SOLN: 15.0000 mL | Freq: Once | OROMUCOSAL | Status: AC

## 2021-06-23 MED ORDER — BISACODYL 5 MG PO TBEC
10.0000 mg | DELAYED_RELEASE_TABLET | Freq: Every day | ORAL | Status: DC
  Administered 2021-06-24: 10 mg via ORAL
  Filled 2021-06-23: qty 2

## 2021-06-23 MED ORDER — CEFAZOLIN SODIUM-DEXTROSE 2-4 GM/100ML-% IV SOLN
2.0000 g | INTRAVENOUS | Status: AC
  Administered 2021-06-23: 2 g via INTRAVENOUS

## 2021-06-23 MED ORDER — CEFAZOLIN SODIUM-DEXTROSE 2-4 GM/100ML-% IV SOLN
INTRAVENOUS | Status: AC
  Filled 2021-06-23: qty 100

## 2021-06-23 MED ORDER — ONDANSETRON HCL 4 MG/2ML IJ SOLN
INTRAMUSCULAR | Status: AC
  Filled 2021-06-23: qty 2

## 2021-06-23 MED ORDER — SCOPOLAMINE 1 MG/3DAYS TD PT72: 1.0000 | MEDICATED_PATCH | TRANSDERMAL | Status: DC

## 2021-06-23 MED ORDER — DEXAMETHASONE SODIUM PHOSPHATE 10 MG/ML IJ SOLN
INTRAMUSCULAR | Status: AC
  Filled 2021-06-23: qty 1

## 2021-06-23 MED ORDER — ONDANSETRON HCL 4 MG/2ML IJ SOLN
INTRAMUSCULAR | Status: DC | PRN
  Administered 2021-06-23: 4 mg via INTRAVENOUS

## 2021-06-23 MED ORDER — HYDROMORPHONE HCL 1 MG/ML IJ SOLN
0.2500 mg | INTRAMUSCULAR | Status: DC | PRN
  Administered 2021-06-23: 0.25 mg via INTRAVENOUS

## 2021-06-23 MED ORDER — SUGAMMADEX SODIUM 200 MG/2ML IV SOLN
INTRAVENOUS | Status: DC | PRN
  Administered 2021-06-23: 200 mg via INTRAVENOUS

## 2021-06-23 MED ORDER — HYDROMORPHONE HCL 1 MG/ML IJ SOLN
INTRAMUSCULAR | Status: AC
  Filled 2021-06-23: qty 1

## 2021-06-23 MED ORDER — ORAL CARE MOUTH RINSE: 15.0000 mL | Freq: Once | OROMUCOSAL | Status: AC

## 2021-06-23 MED ORDER — PROMETHAZINE HCL 25 MG/ML IJ SOLN: 6.2500 mg | INTRAMUSCULAR | Status: DC | PRN

## 2021-06-23 MED ORDER — ALPRAZOLAM 0.5 MG PO TABS
0.2500 mg | ORAL_TABLET | Freq: Two times a day (BID) | ORAL | Status: DC | PRN
  Administered 2021-06-23 – 2021-06-24 (×2): 0.25 mg via ORAL
  Filled 2021-06-23 (×2): qty 1

## 2021-06-23 MED ORDER — LACTATED RINGERS IV SOLN: INTRAVENOUS | Status: DC

## 2021-06-23 MED ORDER — PROPOFOL 10 MG/ML IV BOLUS
INTRAVENOUS | Status: AC
  Filled 2021-06-23: qty 20

## 2021-06-23 MED ORDER — CHLORHEXIDINE GLUCONATE 0.12 % MT SOLN
OROMUCOSAL | Status: AC
  Administered 2021-06-23: 15 mL via OROMUCOSAL
  Filled 2021-06-23: qty 15

## 2021-06-23 SURGICAL SUPPLY — 152 items
ADAPTER BRONCHOSCOPE OLYMPUS (ADAPTER) ×4 IMPLANT
ADAPTER VALVE BIOPSY EBUS (MISCELLANEOUS) IMPLANT
ADH SKN CLS APL DERMABOND .7 (GAUZE/BANDAGES/DRESSINGS) ×2
ADPR BSCP OLMPS EDG (ADAPTER) ×2
ADPTR VALVE BIOPSY EBUS (MISCELLANEOUS)
APPLIER CLIP ROT 10 11.4 M/L (STAPLE)
APR CLP MED LRG 11.4X10 (STAPLE)
BAG SPEC RTRVL C125 8X14 (MISCELLANEOUS) ×2
BLADE CLIPPER SURG (BLADE) ×3 IMPLANT
BLADE SURG SZ11 CARB STEEL (BLADE) ×3 IMPLANT
BNDG COHESIVE 6X5 TAN STRL LF (GAUZE/BANDAGES/DRESSINGS) ×3 IMPLANT
BRUSH BIOPSY BRONCH 10 SDTNB (MISCELLANEOUS) IMPLANT
BRUSH SUPERTRAX BIOPSY (INSTRUMENTS) IMPLANT
BRUSH SUPERTRAX NDL-TIP CYTO (INSTRUMENTS) ×3 IMPLANT
CANISTER SUCT 3000ML PPV (MISCELLANEOUS) ×8 IMPLANT
CANNULA REDUC XI 12-8 STAPL (CANNULA) ×6
CANNULA REDUCER 12-8 DVNC XI (CANNULA) ×6 IMPLANT
CATH THORACIC 28FR (CATHETERS) IMPLANT
CATH THORACIC 28FR RT ANG (CATHETERS) IMPLANT
CATH THORACIC 36FR (CATHETERS) IMPLANT
CATH THORACIC 36FR RT ANG (CATHETERS) IMPLANT
CLIP APPLIE ROT 10 11.4 M/L (STAPLE) IMPLANT
CLIP VESOCCLUDE MED 6/CT (CLIP) IMPLANT
CNTNR URN SCR LID CUP LEK RST (MISCELLANEOUS) ×15 IMPLANT
CONN ST 1/4X3/8  BEN (MISCELLANEOUS)
CONN ST 1/4X3/8 BEN (MISCELLANEOUS) IMPLANT
CONN Y 3/8X3/8X3/8  BEN (MISCELLANEOUS)
CONN Y 3/8X3/8X3/8 BEN (MISCELLANEOUS) IMPLANT
CONT SPEC 4OZ STRL OR WHT (MISCELLANEOUS) ×21
COVER BACK TABLE 60X90IN (DRAPES) ×4 IMPLANT
DEFOGGER SCOPE WARMER CLEARIFY (MISCELLANEOUS) ×4 IMPLANT
DERMABOND ADVANCED (GAUZE/BANDAGES/DRESSINGS) ×1
DERMABOND ADVANCED .7 DNX12 (GAUZE/BANDAGES/DRESSINGS) ×3 IMPLANT
DRAIN CHANNEL 28F RND 3/8 FF (WOUND CARE) IMPLANT
DRAIN CHANNEL 32F RND 10.7 FF (WOUND CARE) IMPLANT
DRAPE ARM DVNC X/XI (DISPOSABLE) ×12 IMPLANT
DRAPE COLUMN DVNC XI (DISPOSABLE) ×3 IMPLANT
DRAPE CV SPLIT W-CLR ANES SCRN (DRAPES) ×4 IMPLANT
DRAPE DA VINCI XI ARM (DISPOSABLE) ×12
DRAPE DA VINCI XI COLUMN (DISPOSABLE) ×3
DRAPE HALF SHEET 40X57 (DRAPES) ×4 IMPLANT
DRAPE INCISE IOBAN 66X45 STRL (DRAPES) IMPLANT
DRAPE ORTHO SPLIT 77X108 STRL (DRAPES)
DRAPE SURG ORHT 6 SPLT 77X108 (DRAPES) ×3 IMPLANT
ELECT BLADE 6.5 EXT (BLADE) IMPLANT
ELECT REM PT RETURN 9FT ADLT (ELECTROSURGICAL) ×3
ELECTRODE REM PT RTRN 9FT ADLT (ELECTROSURGICAL) ×3 IMPLANT
FILTER STRAW FLUID ASPIR (MISCELLANEOUS) ×1 IMPLANT
FORCEPS BIOP SUPERTRX PREMAR (INSTRUMENTS) IMPLANT
GAUZE 4X4 16PLY ~~LOC~~+RFID DBL (SPONGE) ×4 IMPLANT
GAUZE KITTNER 4X5 RF (MISCELLANEOUS) ×7 IMPLANT
GAUZE SPONGE 4X4 12PLY STRL (GAUZE/BANDAGES/DRESSINGS) ×4 IMPLANT
GLOVE SURG MICRO LTX SZ7.5 (GLOVE) ×8 IMPLANT
GLOVE SURG SIGNA 7.5 PF LTX (GLOVE) ×4 IMPLANT
GOWN STRL REUS W/ TWL LRG LVL3 (GOWN DISPOSABLE) ×6 IMPLANT
GOWN STRL REUS W/ TWL XL LVL3 (GOWN DISPOSABLE) ×9 IMPLANT
GOWN STRL REUS W/TWL 2XL LVL3 (GOWN DISPOSABLE) ×8 IMPLANT
GOWN STRL REUS W/TWL LRG LVL3 (GOWN DISPOSABLE) ×6
GOWN STRL REUS W/TWL XL LVL3 (GOWN DISPOSABLE) ×12
HEMOSTAT SURGICEL 2X14 (HEMOSTASIS) ×17 IMPLANT
IRRIGATION STRYKERFLOW (MISCELLANEOUS) ×3 IMPLANT
IRRIGATOR STRYKERFLOW (MISCELLANEOUS) ×3
KIT BASIN OR (CUSTOM PROCEDURE TRAY) ×4 IMPLANT
KIT CLEAN ENDO COMPLIANCE (KITS) ×4 IMPLANT
KIT ILLUMISITE 180 PROCEDURE (KITS) ×1 IMPLANT
KIT ILLUMISITE 90 PROCEDURE (KITS) IMPLANT
KIT SUCTION CATH 14FR (SUCTIONS) IMPLANT
KIT TURNOVER KIT B (KITS) ×4 IMPLANT
LOOP VESSEL SUPERMAXI WHITE (MISCELLANEOUS) IMPLANT
MARKER SKIN DUAL TIP RULER LAB (MISCELLANEOUS) ×4 IMPLANT
NDL HYPO 25GX1X1/2 BEV (NEEDLE) ×3 IMPLANT
NDL SPNL 22GX3.5 QUINCKE BK (NEEDLE) IMPLANT
NDL SUPERTRX PREMARK BIOPSY (NEEDLE) IMPLANT
NEEDLE HYPO 25GX1X1/2 BEV (NEEDLE) ×3 IMPLANT
NEEDLE SPNL 22GX3.5 QUINCKE BK (NEEDLE) IMPLANT
NEEDLE SUPERTRX PREMARK BIOPSY (NEEDLE) IMPLANT
NS IRRIG 1000ML POUR BTL (IV SOLUTION) ×4 IMPLANT
OIL SILICONE PENTAX (PARTS (SERVICE/REPAIRS)) ×4 IMPLANT
PACK CHEST (CUSTOM PROCEDURE TRAY) ×4 IMPLANT
PAD ARMBOARD 7.5X6 YLW CONV (MISCELLANEOUS) ×8 IMPLANT
PATCHES PATIENT (LABEL) ×12 IMPLANT
PORT ACCESS TROCAR AIRSEAL 12 (TROCAR) ×3 IMPLANT
PORT ACCESS TROCAR AIRSEAL 5M (TROCAR)
RELOAD STAPLE 45 2.5 WHT DVNC (STAPLE) IMPLANT
RELOAD STAPLE 45 3.5 BLU DVNC (STAPLE) IMPLANT
RELOAD STAPLE 45 4.3 GRN DVNC (STAPLE) IMPLANT
RELOAD STAPLER 2.5X45 WHT DVNC (STAPLE) ×2 IMPLANT
RELOAD STAPLER 3.5X45 BLU DVNC (STAPLE) ×6 IMPLANT
RELOAD STAPLER 4.3X45 GRN DVNC (STAPLE) ×6 IMPLANT
SCISSORS LAP 5X35 DISP (ENDOMECHANICALS) IMPLANT
SEAL CANN UNIV 5-8 DVNC XI (MISCELLANEOUS) ×6 IMPLANT
SEAL XI 5MM-8MM UNIVERSAL (MISCELLANEOUS) ×9
SEALANT PROGEL (MISCELLANEOUS) IMPLANT
SEALANT SURG COSEAL 4ML (VASCULAR PRODUCTS) IMPLANT
SEALANT SURG COSEAL 8ML (VASCULAR PRODUCTS) IMPLANT
SET TRI-LUMEN FLTR TB AIRSEAL (TUBING) ×4 IMPLANT
SHEARS HARMONIC HDI 20CM (ELECTROSURGICAL) IMPLANT
SOLUTION ELECTROLUBE (MISCELLANEOUS) ×4 IMPLANT
SPONGE INTESTINAL PEANUT (DISPOSABLE) IMPLANT
SPONGE T-LAP 18X18 ~~LOC~~+RFID (SPONGE) ×16 IMPLANT
SPONGE T-LAP 4X18 ~~LOC~~+RFID (SPONGE) ×4 IMPLANT
SPONGE TONSIL TAPE 1 RFD (DISPOSABLE) ×1 IMPLANT
STAPLER 45 SUREFORM CVD (STAPLE) ×3
STAPLER 45 SUREFORM CVD DVNC (STAPLE) IMPLANT
STAPLER CANNULA SEAL DVNC XI (STAPLE) ×6 IMPLANT
STAPLER CANNULA SEAL XI (STAPLE) ×6
STAPLER RELOAD 2.5X45 WHITE (STAPLE) ×3
STAPLER RELOAD 2.5X45 WHT DVNC (STAPLE) ×2
STAPLER RELOAD 3.5X45 BLU DVNC (STAPLE) ×6
STAPLER RELOAD 3.5X45 BLUE (STAPLE) ×9
STAPLER RELOAD 4.3X45 GREEN (STAPLE) ×9
STAPLER RELOAD 4.3X45 GRN DVNC (STAPLE) ×6
SUT PDS AB 3-0 SH 27 (SUTURE) IMPLANT
SUT PROLENE 4 0 RB 1 (SUTURE)
SUT PROLENE 4-0 RB1 .5 CRCL 36 (SUTURE) IMPLANT
SUT SILK  1 MH (SUTURE) ×3
SUT SILK 1 MH (SUTURE) ×6 IMPLANT
SUT SILK 1 TIES 10X30 (SUTURE) ×3 IMPLANT
SUT SILK 2 0 SH (SUTURE) IMPLANT
SUT SILK 2 0SH CR/8 30 (SUTURE) IMPLANT
SUT SILK 3 0 SH 30 (SUTURE) IMPLANT
SUT SILK 3 0SH CR/8 30 (SUTURE) IMPLANT
SUT VIC AB 1 CTX 36 (SUTURE)
SUT VIC AB 1 CTX36XBRD ANBCTR (SUTURE) IMPLANT
SUT VIC AB 2-0 CTX 36 (SUTURE) IMPLANT
SUT VIC AB 3-0 MH 27 (SUTURE) IMPLANT
SUT VIC AB 3-0 X1 27 (SUTURE) ×5 IMPLANT
SUT VICRYL 0 TIES 12 18 (SUTURE) ×4 IMPLANT
SUT VICRYL 0 UR6 27IN ABS (SUTURE) ×8 IMPLANT
SUT VICRYL 2 TP 1 (SUTURE) IMPLANT
SYR 20ML ECCENTRIC (SYRINGE) ×5 IMPLANT
SYR 20ML LL LF (SYRINGE) ×4 IMPLANT
SYR 30ML LL (SYRINGE) ×4 IMPLANT
SYR 5ML LL (SYRINGE) ×4 IMPLANT
SYR TB 1ML LUER SLIP (SYRINGE) IMPLANT
SYSTEM RETRIEVAL ANCHOR 8 (MISCELLANEOUS) ×1 IMPLANT
SYSTEM SAHARA CHEST DRAIN ATS (WOUND CARE) ×4 IMPLANT
TAPE CLOTH 4X10 WHT NS (GAUZE/BANDAGES/DRESSINGS) ×4 IMPLANT
TAPE CLOTH SURG 4X10 WHT LF (GAUZE/BANDAGES/DRESSINGS) ×1 IMPLANT
TIP APPLICATOR SPRAY EXTEND 16 (VASCULAR PRODUCTS) IMPLANT
TOWEL GREEN STERILE (TOWEL DISPOSABLE) ×4 IMPLANT
TOWEL GREEN STERILE FF (TOWEL DISPOSABLE) ×4 IMPLANT
TRAP SPECIMEN MUCUS 40CC (MISCELLANEOUS) ×4 IMPLANT
TRAY FOLEY MTR SLVR 14FR STAT (SET/KITS/TRAYS/PACK) ×1 IMPLANT
TROCAR BLADELESS 15MM (ENDOMECHANICALS) IMPLANT
TROCAR PORT AIRSEAL 8X120 (TROCAR) ×1 IMPLANT
TUBE CONNECTING 20X1/4 (TUBING) ×8 IMPLANT
UNDERPAD 30X36 HEAVY ABSORB (UNDERPADS AND DIAPERS) ×4 IMPLANT
VALVE BIOPSY  SINGLE USE (MISCELLANEOUS) ×3
VALVE BIOPSY SINGLE USE (MISCELLANEOUS) ×3 IMPLANT
VALVE SUCTION BRONCHIO DISP (MISCELLANEOUS) ×4 IMPLANT
WATER STERILE IRR 1000ML POUR (IV SOLUTION) ×4 IMPLANT

## 2021-06-23 NOTE — TOC Transition Note (Signed)
Transition of Care [facility]) - CM/SW Discharge Note   Patient Details  Name: SYDNEI OHAVER MRN: [id number] Date of Birth: May 03, 1956  Transition of Care [facility]) CM/SW Contact:  Angelita Ingles, RN Phone Number:[phone]  06/23/2021, 2:41 PM   Clinical Narrative:     Transition of Care [facility]) Screening Note   Patient Details  Name: MARTY SADLOWSKI Date of Birth: 07-07-1955   Transition of Care [facility]) CM/SW Contact:    Angelita Ingles, RN Phone Number: 06/23/2021, 2:42 PM    Transition of Care Department [facility]) has reviewed patient and no TOC needs have been identified at this time. We will continue to monitor patient advancement through interdisciplinary progression rounds. If new patient transition needs arise, please place a TOC consult.           Patient Goals and CMS Choice        Discharge Placement                       Discharge Plan and Services                                     Social Determinants of Health (SDOH) Interventions     Readmission Risk Interventions No flowsheet data found.

## 2021-06-23 NOTE — Anesthesia Procedure Notes (Signed)
Procedure Name: Intubation Date/Time: 06/23/2021 7:58 AM Performed by: Clearnce Sorrel, CRNA Pre-anesthesia Checklist: Patient identified, Emergency Drugs available, Suction available and Patient being monitored Patient Re-evaluated:Patient Re-evaluated prior to induction Oxygen Delivery Method: Circle System Utilized Preoxygenation: Pre-oxygenation with 100% oxygen Induction Type: IV induction Ventilation: Mask ventilation without difficulty Laryngoscope Size: Mac and 3 Grade View: Grade I Tube type: Oral Tube size: 8.0 mm Number of attempts: 1 Airway Equipment and Method: Stylet and Oral airway Placement Confirmation: ETT inserted through vocal cords under direct vision, positive ETCO2 and breath sounds checked- equal and bilateral Secured at: 22 cm Tube secured with: Tape Dental Injury: Teeth and Oropharynx as per pre-operative assessment

## 2021-06-23 NOTE — Anesthesia Preprocedure Evaluation (Addendum)
Anesthesia Evaluation  Patient identified by MRN, date of birth, ID band Patient awake    Reviewed: Allergy & Precautions, NPO status , Patient's Chart, lab work & pertinent test results  History of Anesthesia Complications Negative for: history of anesthetic complications  Airway Mallampati: I  TM Distance: >3 FB Neck ROM: Full    Dental  (+) Dental Advisory Given, Teeth Intact   Pulmonary  Lung nodule Covid: 04/2021   breath sounds clear to auscultation       Cardiovascular hypertension, Pt. on medications (-) angina+ Peripheral Vascular Disease (Ectasias of the ascending aorta to 3.8 cm is again)   Rhythm:Regular Rate:Normal     Neuro/Psych negative neurological ROS     GI/Hepatic negative GI ROS, Neg liver ROS,   Endo/Other  negative endocrine ROS  Renal/GU negative Renal ROS     Musculoskeletal   Abdominal   Peds  Hematology negative hematology ROS (+)   Anesthesia Other Findings   Reproductive/Obstetrics                            Anesthesia Physical Anesthesia Plan  ASA: 2  Anesthesia Plan: General   Post-op Pain Management: Tylenol PO (pre-op)   Induction:   PONV Risk Score and Plan: 3 and Ondansetron, Dexamethasone and Scopolamine patch - Pre-op  Airway Management Planned: Oral ETT and Double Lumen EBT  Additional Equipment: Arterial line  Intra-op Plan:   Post-operative Plan: Extubation in OR  Informed Consent: I have reviewed the patients History and Physical, chart, labs and discussed the procedure including the risks, benefits and alternatives for the proposed anesthesia with the patient or authorized representative who has indicated his/her understanding and acceptance.     Dental advisory given  Plan Discussed with: CRNA and Surgeon  Anesthesia Plan Comments:        Anesthesia Quick Evaluation

## 2021-06-23 NOTE — Anesthesia Postprocedure Evaluation (Signed)
Anesthesia Post Note  Patient: Karen Salinas  Procedure(s) Performed: XI ROBOTIC ASSISTED THORACOSCOPY-RIGHT LOWER LOBE SUPERIOR SEGMENTECTOMY (Right: Chest) VIDEO BRONCHOSCOPY WITH ENDOBRONCHIAL NAVIGATION INTERCOSTAL NERVE BLOCK (Right: Chest) NODE DISSECTION (Right: Chest)     Patient location during evaluation: PACU Anesthesia Type: General Level of consciousness: awake and alert, patient cooperative and oriented Pain management: pain level controlled Vital Signs Assessment: post-procedure vital signs reviewed and stable Respiratory status: spontaneous breathing, nonlabored ventilation, respiratory function stable and patient connected to nasal cannula oxygen Cardiovascular status: blood pressure returned to baseline and stable Postop Assessment: no apparent nausea or vomiting Anesthetic complications: no   No notable events documented.  Last Vitals:  Vitals:   06/23/21 0622 06/23/21 1135  BP: (!) 142/71 114/78  Pulse: 68 70  Resp: 17 13  Temp: 36.7 C   SpO2: 100% 100%    Last Pain:  Vitals:   06/23/21 0624  TempSrc:   PainSc: 0-No pain                 Gerren Hoffmeier,E. Randell Detter
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
## 2021-06-23 NOTE — Discharge Instructions (Signed)
Robot-Assisted Thoracic Surgery, Care After The following information offers guidance on how to care for yourself after your procedure. Your health care provider may also give you more specific instructions. If you have problems or questions, contact your health care provider. What can I expect after the procedure? After the procedure, it is common to have: Some pain and aches in the area of your surgical incisions. Pain when breathing in (inhaling) and coughing. Tiredness (fatigue). Trouble sleeping. Constipation. Follow these instructions at home: Medicines Take over-the-counter and prescription medicines only as told by your health care provider. If you were prescribed an antibiotic medicine, take it as told by your health care provider. Do not stop taking the antibiotic even if you start to feel better. Talk with your health care provider about safe and effective ways to manage pain after your procedure. Pain management should fit your specific health needs. Take pain medicine before pain becomes severe. Relieving and controlling your pain will make breathing easier for you. Ask your health care provider if the medicine prescribed to you requires you to avoid driving or using machinery. Eating and drinking Follow instructions from your health care provider about eating or drinking restrictions. These will vary depending on what procedure you had. Your health care provider may recommend: A liquid diet or soft diet for the first few days. Meals that are smaller and more frequent. A diet of fruits, vegetables, whole grains, and low-fat proteins. Limiting foods that are high in fat and processed sugar, including fried or sweet foods. Incision care Follow instructions from your health care provider about how to take care of your incisions. Make sure you: Wash your hands with soap and water for at least 20 seconds before and after you change your bandage (dressing). If soap and water are not  available, use hand sanitizer. Change your dressing as told by your health care provider. Leave stitches (sutures), skin glue, or adhesive strips in place. These skin closures may need to stay in place for 2 weeks or longer. If adhesive strip edges start to loosen and curl up, you may trim the loose edges. Do not remove adhesive strips completely unless your health care provider tells you to do that. Check your incision area every day for signs of infection. Check for: Redness, swelling, or more pain. Fluid or blood. Warmth. Pus or a bad smell. Activity Return to your normal activities as told by your health care provider. Ask your health care provider what activities are safe for you. Ask your health care provider when it is safe for you to drive. Do not lift anything that is heavier than 10 lb (4.5 kg), or the limit that you are told, until your health care provider says that it is safe. Rest as told by your health care provider. Avoid sitting for a long time without moving. Get up to take short walks every 1-2 hours. This is important to improve blood flow and breathing. Ask for help if you feel weak or unsteady. Do exercises as told by your health care provider. Pneumonia prevention Do deep breathing exercises and cough regularly as directed. This helps clear mucus and opens your lungs. Doing this helps prevent lung infection (pneumonia). If you were given an incentive spirometer, use it as told. An incentive spirometer is a tool that measures how well you are filling your lungs with each breath. Coughing may hurt less if you try to support your chest. This is called splinting. Try one of these when you cough:  Hold a pillow against your chest. Place the palms of both hands on top of your incision area. Do not use any products that contain nicotine or tobacco. These products include cigarettes, chewing tobacco, and vaping devices, such as e-cigarettes. If you need help quitting, ask your  health care provider. Avoid secondhand smoke. General instructions If you have a drainage tube: Follow instructions from your health care provider about how to take care of it. Do not travel by airplane after your tube is removed until your health care provider tells you it is safe. You may need to take these actions to prevent or treat constipation: Drink enough fluid to keep your urine pale yellow. Take over-the-counter or prescription medicines. Eat foods that are high in fiber, such as beans, whole grains, and fresh fruits and vegetables. Limit foods that are high in fat and processed sugars, such as fried or sweet foods. Keep all follow-up visits. This is important. Contact a health care provider if: You have redness, swelling, or more pain around an incision. You have fluid or blood coming from an incision. An incision feels warm to the touch. You have pus or a bad smell coming from an incision. You have a fever. You cannot eat or drink without vomiting. Your pain medicine is not controlling your pain. Get help right away if: You have chest pain. Your heart is beating quickly. You have trouble breathing. You have trouble speaking. You are confused. You feel weak or dizzy, or you faint. These symptoms may represent a serious problem that is an emergency. Do not wait to see if the symptoms will go away. Get medical help right away. Call your local emergency services (911 in the [place] Do not drive yourself to the hospital. Summary Talk with your health care provider about safe and effective ways to manage pain after your procedure. Pain management should fit your specific health needs. Return to your normal activities as told by your health care provider. Ask your health care provider what activities are safe for you. Do deep breathing exercises and cough regularly as directed. This helps to clear mucus and prevent pneumonia. If it hurts to cough, ease pain by holding a pillow  against your chest or by placing the palms of both hands over your incisions. This information is not intended to replace advice given to you by your health care provider. Make sure you discuss any questions you have with your health care provider. Document Revised: 01/30/2020 Document Reviewed: 01/30/2020 Elsevier Patient Education  2022 [employer].

## 2021-06-23 NOTE — Anesthesia Procedure Notes (Signed)
Arterial Line Insertion Start/End2/06/2021 7:00 AM Performed by: Clearnce Sorrel, CRNA  Patient location: Pre-op. Preanesthetic checklist: patient identified, IV checked, site marked, risks and benefits discussed, surgical consent, monitors and equipment checked, pre-op evaluation, timeout performed and anesthesia consent Lidocaine 1% used for infiltration Left, radial was placed Catheter size: 20 G Hand hygiene performed  and maximum sterile barriers used   Attempts: 1 Procedure performed without using ultrasound guided technique. Following insertion, dressing applied. Post procedure assessment: normal and unchanged

## 2021-06-23 NOTE — Brief Op Note (Addendum)
06/23/2021  11:11 AM  PATIENT:  Karen Salinas  66 y.o. female  PRE-OPERATIVE DIAGNOSIS:  RLL LUNG NODULE  POST-OPERATIVE DIAGNOSIS:  ADENOCARCINOMA RIGHT LOWER LOBE- CLINICAL STAGE IA (T1N0)  PROCEDURE:   VIDEO BRONCHOSCOPY WITH ENDOBRONCHIAL NAVIGATION,  XI ROBOTIC ASSISTED RIGHT THORACOSCOPY,  RIGHT LOWER LOBE SUPERIOR SEGMENTECTOMY,  LYMPH NODE DISSECTION,  INTERCOSTAL NERVE BLOCK ,  SURGEON:  Surgeon(s) and Role:    [name], MD - Primary  PHYSICIAN ASSISTANT: Lars Pinks PA-C  ANESTHESIA:   general  EBL:  25 mL   BLOOD ADMINISTERED:none  DRAINS:  61 Blake drain placed in the right pleural space    LOCAL MEDICATIONS USED:  OTHER Exparel  SPECIMEN:  Source of Specimen:  RLL SUPERIOR SEGMENTECTOMY, multiple lymph nodes  DISPOSITION OF SPECIMEN:   Pathology. Frozen section was consistent for adenocarcinoma and margin negative for cancer  COUNTS CORRECT:  YES  DICTATION: .Dragon Dictation  PLAN OF CARE: Admit to inpatient   PATIENT DISPOSITION:  PACU - hemodynamically stable.   Delay start of Pharmacological VTE agent (>24hrs) due to surgical blood loss or risk of bleeding: no
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
## 2021-06-23 NOTE — Interval H&P Note (Signed)
History and Physical Interval Note: PET reviewed 06/23/2021 7:46 AM  Karen Salinas  has presented today for surgery, with the diagnosis of RLL LUNG NODULE.  The various methods of treatment have been discussed with the patient and family. After consideration of risks, benefits and other options for treatment, the patient has consented to  Procedure(s): XI ROBOTIC ASSISTED THORACOSCOPY-RIGHT LOWER LOBE SUPERIOR SEGMENTECTOMY (Right) VIDEO BRONCHOSCOPY WITH ENDOBRONCHIAL NAVIGATION (N/A) as a surgical intervention.  The patient's history has been reviewed, patient examined, no change in status, stable for surgery.  I have reviewed the patient's chart and labs.  Questions were answered to the patient's satisfaction.     [name]

## 2021-06-23 NOTE — Transfer of Care (Signed)
Immediate Anesthesia Transfer of Care Note  Patient: Karen Salinas  Procedure(s) Performed: XI ROBOTIC ASSISTED THORACOSCOPY-RIGHT LOWER LOBE SUPERIOR SEGMENTECTOMY (Right: Chest) VIDEO BRONCHOSCOPY WITH ENDOBRONCHIAL NAVIGATION INTERCOSTAL NERVE BLOCK (Right: Chest) NODE DISSECTION (Right: Chest)  Patient Location: PACU  Anesthesia Type:General  Level of Consciousness: awake, alert  and oriented  Airway & Oxygen Therapy: Patient Spontanous Breathing and Patient connected to face mask oxygen  Post-op Assessment: Report given to RN and Post -op Vital signs reviewed and stable  Post vital signs: Reviewed and stable  Last Vitals:  Vitals Value Taken Time  BP 114/78 06/23/21 1133  Temp    Pulse 72 06/23/21 1142  Resp 20 06/23/21 1142  SpO2 98 % 06/23/21 1142  Vitals shown include unvalidated device data.  Last Pain:  Vitals:   06/23/21 0624  TempSrc:   PainSc: 0-No pain         Complications: No notable events documented.

## 2021-06-23 NOTE — Plan of Care (Signed)
°  Problem: Cardiac: Goal: Will achieve and/or maintain hemodynamic stability Outcome: Progressing   Problem: Clinical Measurements: Goal: Postoperative complications will be avoided or minimized Outcome: Progressing   Problem: Respiratory: Goal: Respiratory status will improve Outcome: Progressing   Problem: Clinical Measurements: Goal: Will remain free from infection Outcome: Progressing Goal: Diagnostic test results will improve Outcome: Progressing   Problem: Pain Managment: Goal: General experience of comfort will improve Outcome: Progressing

## 2021-06-24 ENCOUNTER — Encounter (HOSPITAL_COMMUNITY): Payer: Self-pay | Admitting: Thoracic Surgery (Cardiothoracic Vascular Surgery)

## 2021-06-24 ENCOUNTER — Inpatient Hospital Stay (HOSPITAL_COMMUNITY): Payer: Medicare Other

## 2021-06-24 LAB — BASIC METABOLIC PANEL
Anion gap: 7 (ref 5–15)
BUN: 9 mg/dL (ref 8–23)
CO2: 20 mmol/L — ABNORMAL LOW (ref 22–32)
Calcium: 8.5 mg/dL — ABNORMAL LOW (ref 8.9–10.3)
Chloride: 111 mmol/L (ref 98–111)
Creatinine, Ser: 0.85 mg/dL (ref 0.44–1.00)
GFR, Estimated: >60 mL/min (ref >60)
Glucose, Bld: 157 mg/dL — ABNORMAL HIGH (ref 70–99)
Potassium: 4 mmol/L (ref 3.5–5.1)
Sodium: 138 mmol/L (ref 135–145)

## 2021-06-24 LAB — CBC
HCT: 37.3 % (ref 36.0–46.0)
Hemoglobin: 13.1 g/dL (ref 12.0–15.0)
MCH: 31.8 pg (ref 26.0–34.0)
MCHC: 35.1 g/dL (ref 30.0–36.0)
MCV: 90.5 fL (ref 80.0–100.0)
Platelets: 211 10*3/uL (ref 150–400)
RBC: 4.12 MIL/uL (ref 3.87–5.11)
RDW: 11.7 % (ref 11.5–15.5)
WBC: 13.3 10*3/uL — ABNORMAL HIGH (ref 4.0–10.5)
nRBC: 0 % (ref 0.0–0.2)

## 2021-06-24 MED ORDER — LOSARTAN POTASSIUM 25 MG PO TABS: 37.5000 mg | ORAL_TABLET | Freq: Every day | ORAL | Status: DC

## 2021-06-24 MED ORDER — LOSARTAN POTASSIUM 25 MG PO TABS
25.0000 mg | ORAL_TABLET | Freq: Once | ORAL | Status: AC
  Administered 2021-06-24: 25 mg via ORAL
  Filled 2021-06-24: qty 1

## 2021-06-24 NOTE — Op Note (Signed)
NAME: Karen Salinas RECORD NO: [id number] ACCOUNT NO: [id number] DATE OF BIRTH: [birth year] FACILITY: [facility] LOCATION: [facility] PHYSICIAN: Karen Salinas. Karen Hockey, MD  Operative Report   DATE OF PROCEDURE: 06/23/2021   PREOPERATIVE DIAGNOSIS:  Right lower lobe lung nodule.  POSTOPERATIVE DIAGNOSIS:  Adenocarcinoma, right lower lobe, clinical stage IA (T1, N0).  PROCEDURE PERFORMED:   Electromagnetic navigational bronchoscopy for tumor marking Xi robotic-assisted right lower lobe superior segmentectomy,  Lymph node dissection,  Intercostal nerve block levels 3 through 10.  SURGEON:  Karen Salinas. Karen Hockey, MD.  ASSISTANTLars Pinks, PA-C.    ANESTHESIA:  General.  FINDINGS:  Frozen section of nodule revealed adenocarcinoma.  Margin clear.  CLINICAL NOTE: Karen Salinas is a 66 year old woman who was incidentally noted to have a lung nodule in the superior segment of her right lower lobe on a CT of the chest.  In retrospect, the nodule was present at that site and smaller on a CT from four years prior.  PET/CT showed no significant metabolic activity.  There was no evidence of regional or distant metastatic disease.  Given that the nodule has increased in size over time, she was advised to undergo surgical resection.  The indications, risks, benefits, and  alternatives were discussed in detail with the patient.  She accepted the risks and agreed to proceed.    PROCEDURE: Karen Salinas was brought to the preoperative holding area on 06/23/2021.  Anesthesia placed an arterial blood pressure monitoring line and established intravenous access.  Planning for the navigational bronchoscopy was done prior to induction.  She was taken to the operating room and anesthetized and intubated with a double-lumen endotracheal tube.  Sequential compression devices were placed on the calves for DVT prophylaxis.  Foley catheter was placed.  Intravenous antibiotics were administered.    A timeout was performed.   Flexible fiberoptic bronchoscopy was performed via the endotracheal tube.  It revealed no endobronchial lesions to the level of the subsegmental bronchi.  Locatable guide for navigation was placed.  Registration was performed.  The  locatable guide was advanced to the appropriate subsegmental bronchus in the right lower lobe superior segment.  It easily advanced to within a centimeter of the nodule with good alignment.  Fluoroscopy confirmed position of the catheter.  An aspirating needle  then was used to inject 0.5 mL of a solution containing 50% methylene blue and 50% ICG into the nodule.  The total fluoroscopy time was 0.2 minutes.  The bronchoscope was withdrawn.  She was reintubated with a double lumen endotracheal tube.  She was placed in a left lateral decubitus position.  The right chest was prepped and draped in the usual sterile fashion.  Single lung ventilation of the left lung was initiated and was  tolerated well throughout the procedure.  Another timeout was performed, a solution containing 20 mL of liposomal bupivacaine, 30 mL of 0.5% bupivacaine and 50 mL of saline was prepared.  This was used for local at the incision sites as well as for the  intercostal nerve blocks.  An incision was made in the eighth interspace in the midaxillary line.  An 8 mm robotic port was inserted.  The thoracoscope was advanced into the chest.  After confirming intrapleural placement, carbon dioxide was insufflated  per protocol.  A 12 mm port was placed in the eighth interspace anterior to the camera port. Intercostal nerve blocks then were performed by injecting 10 mL of the bupivacaine solution into a subpleural plane at each level from the  3rd to the 10th  Interspace.  An 8 mm AirSeal port was placed in the tenth interspace posterolaterally.  Two additional 8 mm robotic ports were placed in the eighth interspace.  The robot was deployed.  The camera arm was docked, targeting was performed.  The remaining   arms were docked.  Robotic instruments were inserted with thoracoscopic visualization.  The right lower lobe was retracted superiorly.  The inferior pulmonary ligament was divided with bipolar cautery.  All lymph nodes that were encountered during the dissection were removed and sent as separate specimens for permanent pathology.  All of the nodes  appeared grossly normal.  Level 9 node was removed.  The pleural reflection was divided the hilum posteriorly and multiple level 7 nodes were removed.  The dissection was carried up along the bronchus to the bifurcation of the bronchus intermedius from  the right upper lobe bronchus and a level 11 node was removed from that area.  The fissure then was inspected.  It was nearly complete.  The pleura was divided with bipolar cautery over the pulmonary artery and the fissure was completed with bipolar  cautery.  After completing the fissure, the dissection was carried down to the pulmonary artery.  The pulmonary artery trunk to the lower lobe was identified.  The superior segmental branch was identified.  It was dissected out and then divided with the  robotic stapler using a vascular cartridge.  Dissection then was carried down to the bronchus and the superior segmental branch of the lower lobe bronchus was identified.  There was a node which was removed in that area.  The bronchus was then encircled.  The stapler was placed across the superior segmental bronchus and closed, but not fired.  A test inflation showed good aeration of the remainder of the lower lobe.  Using the [facility] setting on the robot, the nodule was confirmed to be in the inferior part of the superior segment.  The segmentectomy then proceeded with sequential firings of the robotic stapler periodically checking with firefly to ensure that the nodule was within the specimen with a good gross margin.  After  completing the segmentectomy, the specimen was left in place.  The upper lobe then was  retracted inferiorly.  The pleura was incised over the mediastinum and level 10 and 4R nodes were removed.  These nodes also appeared grossly normal.  The sponges and vessel loop that had been used during the procedure were removed.  The robotic  instruments were removed and the robot was undocked.  The 8 mm endoscopic retrieval bag was placed through the anterior port incision and the specimen was manipulated into the bag.  It was then removed through the anterior incision.  The nodule was  marked and the specimen was sent for frozen section of the nodule, which returned showing an adenocarcinoma.  The margin was clear.  The chest was copiously irrigated with saline.  A test inflation to 30 cm water revealed no air leak.  A 28-French Blake  drain was placed through the anterior port incision and secured with a #1 silk suture.  The remaining incisions were closed in Salinas fashion.  Dermabond was applied.  The chest tube was placed to a Pleur-Evac on waterseal.  She was extubated in the  operating room and taken to the postanesthetic care unit in good condition.  All sponge, needle and instrument counts were correct at the end of the procedure.    Experienced assistance was necessary for this  case due to complexity. Karen Pinks, PA performed that role.  She assisted with port placement, docking the robot, instrument exchange, suctioning, specimen retrieval and wound closure.     SUJ D: 06/23/2021 8:03:55 pm T: 06/24/2021 12:17:00 am  JOB: [id number]/ [id number]

## 2021-06-24 NOTE — Progress Notes (Signed)
Pt concerned for flushing in checks and areolas. RN noted that patient had been doing extensive deep breathing, plus ample ambulation. Pt was advised that she should increase activity slowly. Vital signs rechecked. Afebrile. BP slightly elevated, but patient did not take daily losartan this morning. Will page PA.

## 2021-06-24 NOTE — Progress Notes (Signed)
Mobility Specialist Progress Note    06/24/21 1703  Mobility  Activity Ambulated with assistance in hallway  Level of Assistance Contact guard assist, steadying assist  Assistive Device  (HHA)  Distance Ambulated (ft) 820 ft  Activity Response Tolerated well  $Mobility charge 1 Mobility   Pt received and agreeable. No complaints on walk. Had void in BR and returned to bed with call bell in reach and son present.   [facility] Mobility Specialist  M.S. 2C and 6E: [phone] M.S. 4E: [phone]

## 2021-06-24 NOTE — Progress Notes (Addendum)
° °   °  [address]       [place] [address]             [phone]       1 Day Post-Op Procedure(s) (LRB): XI ROBOTIC ASSISTED THORACOSCOPY-RIGHT LOWER LOBE SUPERIOR SEGMENTECTOMY (Right) VIDEO BRONCHOSCOPY WITH ENDOBRONCHIAL NAVIGATION (N/A) INTERCOSTAL NERVE BLOCK (Right) NODE DISSECTION (Right)  Subjective: Patient does not have much pain. She walked several times in her room yesterday. She has no specific complaint this am  Objective: Vital signs in last 24 hours: Temp:  [97.6 F (36.4 C)-98.6 F (37 C)] 98.1 F (36.7 C) (02/03 0331) Pulse Rate:  [61-78] 63 (02/03 0400) Cardiac Rhythm: Normal sinus rhythm (02/02 2003) Resp:  [13-20] 18 (02/03 0331) BP: (113-136)/(60-90) 114/64 (02/03 0400) SpO2:  [98 %-100 %] 98 % (02/03 0331) Arterial Line BP: (166-171)/(80-82) 171/82 (02/02 1150)      Intake/Output from previous day: 02/02 0701 - 02/03 0700 In: 3177.8 [P.O.:360; I.V.:2577.9; IV Piggyback:239.8] Out: 665 [Urine:400; Blood:25; Chest Tube:240]   Physical Exam:  Cardiovascular: RRR, no murmurs, gallops, or rubs. Pulmonary: Clear to auscultation bilaterally (rub with chest tube on right) Abdomen: Soft, non tender, bowel sounds present. Extremities: No lower extremity edema. Wounds: Clean and dry.  No erythema or signs of infection. Chest Tube: to water seal, tidling with cough but no air leak  Lab Results: CBC: Recent Labs    06/21/21 0950 06/24/21 0108  WBC 5.5 13.3*  HGB 15.4* 13.1  HCT 44.9 37.3  PLT 276 211   BMET:  Recent Labs    06/21/21 0950 06/24/21 0108  NA 140 138  K 4.0 4.0  CL 110 111  CO2 21* 20*  GLUCOSE 106* 157*  BUN 12 9  CREATININE 0.86 0.85  CALCIUM 9.4 8.5*    PT/INR:  Recent Labs    06/21/21 0950  LABPROT 12.1  INR 0.9   ABG:  INR: Will add last result for INR, ABG once components are confirmed Will add last 4 CBG results once components are confirmed  Assessment/Plan:  1. CV - SR with HR  in the 60's this am. Will restart Losartan at discharge. 2.  Pulmonary - Chest tube with 240 cc since surgery. Chest tube is to water seal, no air leak only tidling with cough. CXR this am appears to show trace right apical pneumothorax. As discussed with Dr. Roxan Hockey, will remove chest tube. Encourage incentive spirometer. Await final pathology. 3. On Lovenox for DVT prophylaxis 4.  Decrease IVF 5. Check CXR after chest tube removed;possible discharge later today vs am  Sharalyn Ink ZimmermanPA-C 06/24/2021,6:58 AM [phone]   Looks great No air leak, CXR shows a tiny sliver of air at the apex- will dc chest tube Already ambulating  Johni Narine C. Roxan Hockey, MD [facility] [phone]

## 2021-06-24 NOTE — Plan of Care (Signed)

## 2021-06-25 ENCOUNTER — Inpatient Hospital Stay (HOSPITAL_COMMUNITY): Payer: Medicare Other

## 2021-06-25 LAB — COMPREHENSIVE METABOLIC PANEL
ALT: 42 U/L (ref 0–44)
AST: 34 U/L (ref 15–41)
Albumin: 3.1 g/dL — ABNORMAL LOW (ref 3.5–5.0)
Alkaline Phosphatase: 44 U/L (ref 38–126)
Anion gap: 5 (ref 5–15)
BUN: 13 mg/dL (ref 8–23)
CO2: 24 mmol/L (ref 22–32)
Calcium: 8.5 mg/dL — ABNORMAL LOW (ref 8.9–10.3)
Chloride: 112 mmol/L — ABNORMAL HIGH (ref 98–111)
Creatinine, Ser: 0.88 mg/dL (ref 0.44–1.00)
GFR, Estimated: >60 mL/min (ref >60)
Glucose, Bld: 105 mg/dL — ABNORMAL HIGH (ref 70–99)
Potassium: 3.8 mmol/L (ref 3.5–5.1)
Sodium: 141 mmol/L (ref 135–145)
Total Bilirubin: 1.1 mg/dL (ref 0.3–1.2)
Total Protein: 5 g/dL — ABNORMAL LOW (ref 6.5–8.1)

## 2021-06-25 LAB — CBC
HCT: 35.9 % — ABNORMAL LOW (ref 36.0–46.0)
Hemoglobin: 12.4 g/dL (ref 12.0–15.0)
MCH: 32.2 pg (ref 26.0–34.0)
MCHC: 34.5 g/dL (ref 30.0–36.0)
MCV: 93.2 fL (ref 80.0–100.0)
Platelets: 203 10*3/uL (ref 150–400)
RBC: 3.85 MIL/uL — ABNORMAL LOW (ref 3.87–5.11)
RDW: 12.3 % (ref 11.5–15.5)
WBC: 10.1 10*3/uL (ref 4.0–10.5)
nRBC: 0 % (ref 0.0–0.2)

## 2021-06-25 MED ORDER — LOSARTAN POTASSIUM 25 MG PO TABS
37.5000 mg | ORAL_TABLET | Freq: Every day | ORAL | Status: DC
  Administered 2021-06-25: 37.5 mg via ORAL
  Filled 2021-06-25 (×2): qty 2

## 2021-06-25 MED ORDER — OXYCODONE HCL 5 MG PO TABS: 5.0000 mg | ORAL_TABLET | Freq: Four times a day (QID) | ORAL | 0 refills | Status: DC | PRN

## 2021-06-25 NOTE — Plan of Care (Signed)
°  Problem: Education: Goal: Knowledge of disease or condition will improve Outcome: Progressing Goal: Knowledge of the prescribed therapeutic regimen will improve Outcome: Progressing   Problem: Activity: Goal: Risk for activity intolerance will decrease Outcome: Progressing   Problem: Cardiac: Goal: Will achieve and/or maintain hemodynamic stability Outcome: Progressing   Problem: Clinical Measurements: Goal: Postoperative complications will be avoided or minimized Outcome: Progressing   Problem: Respiratory: Goal: Respiratory status will improve Outcome: Progressing   Problem: Pain Management: Goal: Pain level will decrease Outcome: Progressing   Problem: Skin Integrity: Goal: Wound healing without signs and symptoms infection will improve Outcome: Progressing   Problem: Education: Goal: Knowledge of General Education information will improve Description: Including pain rating scale, medication(s)/side effects and non-pharmacologic comfort measures Outcome: Progressing   Problem: Clinical Measurements: Goal: Ability to maintain clinical measurements within normal limits will improve Outcome: Progressing Goal: Will remain free from infection Outcome: Progressing Goal: Diagnostic test results will improve Outcome: Progressing Goal: Respiratory complications will improve Outcome: Progressing Goal: Cardiovascular complication will be avoided Outcome: Progressing   Problem: Health Behavior/Discharge Planning: Goal: Ability to manage health-related needs will improve Outcome: Progressing   Problem: Activity: Goal: Risk for activity intolerance will decrease Outcome: Progressing   Problem: Nutrition: Goal: Adequate nutrition will be maintained Outcome: Progressing   Problem: Coping: Goal: Level of anxiety will decrease Outcome: Progressing   Problem: Elimination: Goal: Will not experience complications related to bowel motility Outcome: Progressing Goal:  Will not experience complications related to urinary retention Outcome: Progressing   Problem: Pain Managment: Goal: General experience of comfort will improve Outcome: Progressing   Problem: Safety: Goal: Ability to remain free from injury will improve Outcome: Progressing   Problem: Skin Integrity: Goal: Risk for impaired skin integrity will decrease Outcome: Progressing

## 2021-06-25 NOTE — Plan of Care (Signed)

## 2021-06-25 NOTE — Progress Notes (Signed)
2 Days Post-Op Procedure(s) (LRB): XI ROBOTIC ASSISTED THORACOSCOPY-RIGHT LOWER LOBE SUPERIOR SEGMENTECTOMY (Right) VIDEO BRONCHOSCOPY WITH ENDOBRONCHIAL NAVIGATION (N/A) INTERCOSTAL NERVE BLOCK (Right) NODE DISSECTION (Right) Subjective: No complaints. Pain well controlled with Tylenol Flushing better  Objective: Vital signs in last 24 hours: Temp:  [97.9 F (36.6 C)-98.7 F (37.1 C)] 97.9 F (36.6 C) (02/04 0400) Pulse Rate:  [67-98] 72 (02/04 0400) Cardiac Rhythm: Normal sinus rhythm (02/03 1943) Resp:  [18-20] 18 (02/04 0400) BP: (140-150)/(72-91) 140/79 (02/04 0400) SpO2:  [98 %-100 %] 98 % (02/04 0400)  Hemodynamic parameters for last 24 hours:    Intake/Output from previous day: No intake/output data recorded. Intake/Output this shift: No intake/output data recorded.  General appearance: alert, cooperative, and no distress Neurologic: intact Heart: regular rate and rhythm Lungs: clear to auscultation bilaterally Wound: clean and dry  Lab Results: Recent Labs    06/24/21 0108 06/25/21 0113  WBC 13.3* 10.1  HGB 13.1 12.4  HCT 37.3 35.9*  PLT 211 203   BMET:  Recent Labs    06/24/21 0108 06/25/21 0113  NA 138 141  K 4.0 3.8  CL 111 112*  CO2 20* 24  GLUCOSE 157* 105*  BUN 9 13  CREATININE 0.85 0.88  CALCIUM 8.5* 8.5*    PT/INR: No results for input(s): LABPROT, INR in the last 72 hours. ABG    Component Value Date/Time   PHART 7.429 06/21/2021 1035   HCO3 22.0 06/21/2021 1035   ACIDBASEDEF 1.7 06/21/2021 1035   O2SAT 97.2 06/21/2021 1035   CBG (last 3)  No results for input(s): GLUCAP in the last 72 hours.  Assessment/Plan: S/P Procedure(s) (LRB): XI ROBOTIC ASSISTED THORACOSCOPY-RIGHT LOWER LOBE SUPERIOR SEGMENTECTOMY (Right) VIDEO BRONCHOSCOPY WITH ENDOBRONCHIAL NAVIGATION (N/A) INTERCOSTAL NERVE BLOCK (Right) NODE DISSECTION (Right) Plan to dc home today Path pending   LOS: 2 days    Karen Salinas 06/25/2021

## 2021-06-27 LAB — SURGICAL PATHOLOGY

## 2021-06-30 ENCOUNTER — Other Ambulatory Visit: Payer: Self-pay | Admitting: *Deleted

## 2021-06-30 NOTE — Progress Notes (Signed)
The proposed treatment discussed in conference is for discussion purpose only and is not a binding recommendation. The patient was not been physically examined, or presented with their treatment options. Therefore, final treatment plans cannot be decided.  

## 2021-07-01 ENCOUNTER — Telehealth (HOSPITAL_BASED_OUTPATIENT_CLINIC_OR_DEPARTMENT_OTHER): Payer: Self-pay | Admitting: Family

## 2021-07-01 MED ORDER — LOSARTAN POTASSIUM 25 MG PO TABS: 37.5000 mg | ORAL_TABLET | Freq: Every day | ORAL | 1 refills | Status: DC

## 2021-07-01 NOTE — Telephone Encounter (Signed)
Called [employer] in regards to a fax we received for this pt concerning Losartan Potassium. The fax stated that an alternative needed to be prescribed.   However, according to the pt plan and medication coverage, Losartan Potassium 25 mg is covered and listed as a Tier 1 preferred generic.  When speaking with Silva Bandy [facility] representative), she stated the Losartan is covered for up to two 25 mg tablets, She stated that the last refill that was sent in had a quantity of 181 tablets, which would be more than 2 tablets daily.  Informed her that the pt is taking 1 & 1/2 tablets daily and asked if sending in another Rx with corrected quantity would be covered. She stated yes, but it looks like the 181 tablet Rx was paid for already and is not due for refill until 08/30/21.  Thanked her for her time. Sent in new Losartan Rx with correct quantity of 135 tablets for 90 day supply.

## 2021-07-05 ENCOUNTER — Telehealth: Payer: Self-pay | Admitting: *Deleted

## 2021-07-05 ENCOUNTER — Telehealth: Payer: Self-pay | Admitting: Thoracic Surgery (Cardiothoracic Vascular Surgery)

## 2021-07-05 MED ORDER — PREGABALIN 25 MG PO CAPS: 25.0000 mg | ORAL_CAPSULE | Freq: Two times a day (BID) | ORAL | 1 refills | Status: DC

## 2021-07-05 NOTE — Telephone Encounter (Signed)
° °   °  [address]       [place] [address]             [phone]     Jonesha called to discuss pain management.  She has been having numbness and burning in pins-and-needles type sensations in the left chest near the chest tube site.  She had trouble sleeping for the last 3 nights.  Oxycodone has not been effective although it did help her sleep a little bit.  We discussed that this is intercostal neuralgia.  Is quite common after thoracic surgery.  Typically responds better to Lyrica or gabapentin then narcotics.  I prescribed Lyrica 25 mg twice daily.  60 tablets, 1 refill.  She will start by taking it just at night tonight and tomorrow night and then go to twice a day if she does not have any side effects.  I see her in the office in about a week and we will further assess at that time.  She knows to call if she has any issues.  Revonda Standard Roxan Hockey, MD [facility] [phone]

## 2021-07-05 NOTE — Telephone Encounter (Signed)
Patient contacted the office with c/o constipation and surgical pain s/p RAT's segmentectomy 2/2 by Dr. Roxan Hockey. Per patient she is passing gas and had a small bowel movement over the weekend after one enema. Per patient she denies bloating or abdominal pain. Advised patient she can take colace daily and try Miralax to help with constipation. Advised patient to remain active and drink plenty of water throughout the day. Discussed taking Tylenol for pain instead of Oxycodone as it may cause constipation. Per patient, pain can often feel like pins and needles and burning pain. Advised she may be experiencing neuropathy which takes time to go away. Advised patient to discuss symptoms with Dr. Roxan Hockey at upcoming appt. Patient also states she is having trouble falling asleep. Advised she may take over the counter sleep aids to help get a restful nights sleep. Patient verbalizes understanding.

## 2021-07-07 ENCOUNTER — Other Ambulatory Visit: Payer: Self-pay

## 2021-07-07 ENCOUNTER — Encounter (INDEPENDENT_AMBULATORY_CARE_PROVIDER_SITE_OTHER): Payer: Self-pay

## 2021-07-07 DIAGNOSIS — Z4802 Encounter for removal of sutures: Secondary | ICD-10-CM

## 2021-07-12 ENCOUNTER — Encounter: Payer: Self-pay | Admitting: Thoracic Surgery (Cardiothoracic Vascular Surgery)

## 2021-07-12 ENCOUNTER — Ambulatory Visit (INDEPENDENT_AMBULATORY_CARE_PROVIDER_SITE_OTHER): Payer: Self-pay | Admitting: Thoracic Surgery (Cardiothoracic Vascular Surgery)

## 2021-07-12 ENCOUNTER — Other Ambulatory Visit: Payer: Self-pay | Admitting: Thoracic Surgery (Cardiothoracic Vascular Surgery)

## 2021-07-12 ENCOUNTER — Ambulatory Visit
Admission: RE | Admit: 2021-07-12 | Discharge: 2021-07-12 | Disposition: A | Discharge status: Home / Self Care | Payer: Medicare Other | Source: Ambulatory Visit | Attending: Thoracic Surgery (Cardiothoracic Vascular Surgery) | Admitting: Thoracic Surgery (Cardiothoracic Vascular Surgery)

## 2021-07-12 ENCOUNTER — Other Ambulatory Visit: Payer: Self-pay

## 2021-07-12 VITALS — BP 144/83 | HR 95 | Resp 20 | Ht 62.0 in | Wt 125.8 lb

## 2021-07-12 DIAGNOSIS — Z9889 Other specified postprocedural states: Secondary | ICD-10-CM

## 2021-07-12 DIAGNOSIS — R911 Solitary pulmonary nodule: Secondary | ICD-10-CM

## 2021-07-12 MED ORDER — PREGABALIN 25 MG PO CAPS: 50.0000 mg | ORAL_CAPSULE | Freq: Two times a day (BID) | ORAL | 2 refills | Status: DC
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
## 2021-07-13 ENCOUNTER — Encounter: Payer: Self-pay | Admitting: *Deleted

## 2021-07-13 ENCOUNTER — Encounter: Payer: Self-pay | Admitting: Thoracic Surgery (Cardiothoracic Vascular Surgery)

## 2021-07-13 DIAGNOSIS — Z9889 Other specified postprocedural states: Secondary | ICD-10-CM

## 2021-07-13 NOTE — Progress Notes (Signed)
Oncology Nurse Navigator Documentation  Oncology Nurse Navigator Flowsheets 07/13/2021  Navigator Follow Up Reason: New Patient Appointment  Navigation Complete Date: 08/04/2021  Navigator Location [facility]  Referral Date to RadOnc/MedOnc 07/13/2021  Navigator Encounter Type Telephone  Telephone Outgoing Call/I received referral on Karen Salinas today. I called and spoke to her husband.  I was able to schedule her to be seen on 3/16 per his request.   Patient Visit Type Other  Treatment Phase Other  Barriers/Navigation Needs Coordination of Care;Education  Education Other  Interventions Education;Coordination of Care  Acuity Level 2-Minimal Needs (1-2 Barriers Identified)  Coordination of Care Appts  Education Method Verbal  Time Spent with Patient 30

## 2021-07-18 ENCOUNTER — Ambulatory Visit (HOSPITAL_COMMUNITY)
Admission: RE | Admit: 2021-07-18 | Discharge: 2021-07-18 | Disposition: A | Discharge status: Home / Self Care | Payer: Medicare Other | Source: Ambulatory Visit | Attending: Thoracic Surgery (Cardiothoracic Vascular Surgery) | Admitting: Thoracic Surgery (Cardiothoracic Vascular Surgery)

## 2021-07-18 ENCOUNTER — Other Ambulatory Visit: Payer: Self-pay

## 2021-07-18 DIAGNOSIS — R911 Solitary pulmonary nodule: Secondary | ICD-10-CM | POA: Diagnosis not present

## 2021-07-21 ENCOUNTER — Other Ambulatory Visit: Payer: Self-pay | Admitting: Thoracic Surgery (Cardiothoracic Vascular Surgery)

## 2021-07-21 MED ORDER — PREGABALIN 25 MG PO CAPS: 75.0000 mg | ORAL_CAPSULE | Freq: Two times a day (BID) | ORAL | 2 refills | Status: DC

## 2021-07-21 NOTE — Progress Notes (Signed)
Increase Lyrica 75 BID, 180 tabs, 2 refills ? ?[place] ? ?

## 2021-07-23 ENCOUNTER — Other Ambulatory Visit: Payer: Self-pay | Admitting: Family

## 2021-07-23 DIAGNOSIS — E785 Hyperlipidemia, unspecified: Secondary | ICD-10-CM

## 2021-07-25 NOTE — Telephone Encounter (Signed)
Rx(s) sent to pharmacy electronically.  

## 2021-08-04 ENCOUNTER — Other Ambulatory Visit: Payer: Self-pay

## 2021-08-04 ENCOUNTER — Inpatient Hospital Stay: Payer: Medicare Other

## 2021-08-04 ENCOUNTER — Inpatient Hospital Stay: Payer: Medicare Other | Attending: Internal Medicine | Admitting: Internal Medicine

## 2021-08-04 ENCOUNTER — Encounter: Payer: Self-pay | Admitting: Internal Medicine

## 2021-08-04 VITALS — BP 141/81 | HR 92 | Temp 97.0°F | Resp 17 | Ht 62.0 in | Wt 128.2 lb

## 2021-08-04 DIAGNOSIS — E785 Hyperlipidemia, unspecified: Secondary | ICD-10-CM | POA: Diagnosis not present

## 2021-08-04 DIAGNOSIS — Z902 Acquired absence of lung [part of]: Secondary | ICD-10-CM | POA: Insufficient documentation

## 2021-08-04 DIAGNOSIS — C3431 Malignant neoplasm of lower lobe, right bronchus or lung: Secondary | ICD-10-CM | POA: Diagnosis present

## 2021-08-04 DIAGNOSIS — K589 Irritable bowel syndrome without diarrhea: Secondary | ICD-10-CM | POA: Insufficient documentation

## 2021-08-04 DIAGNOSIS — Z8042 Family history of malignant neoplasm of prostate: Secondary | ICD-10-CM | POA: Insufficient documentation

## 2021-08-04 DIAGNOSIS — Z87442 Personal history of urinary calculi: Secondary | ICD-10-CM | POA: Diagnosis not present

## 2021-08-04 DIAGNOSIS — I10 Essential (primary) hypertension: Secondary | ICD-10-CM | POA: Insufficient documentation

## 2021-08-04 DIAGNOSIS — Z8041 Family history of malignant neoplasm of ovary: Secondary | ICD-10-CM | POA: Insufficient documentation

## 2021-08-04 DIAGNOSIS — Z79899 Other long term (current) drug therapy: Secondary | ICD-10-CM | POA: Diagnosis not present

## 2021-08-04 LAB — CBC WITH DIFFERENTIAL (CANCER CENTER ONLY)
Abs Immature Granulocytes: 0.02 10*3/uL (ref 0.00–0.07)
Basophils Absolute: 0 10*3/uL (ref 0.0–0.1)
Basophils Relative: 0 %
Eosinophils Absolute: 0.1 10*3/uL (ref 0.0–0.5)
Eosinophils Relative: 1 %
HCT: 39.1 % (ref 36.0–46.0)
Hemoglobin: 14.1 g/dL (ref 12.0–15.0)
Immature Granulocytes: 0 %
Lymphocytes Relative: 36 %
Lymphs Abs: 2.2 10*3/uL (ref 0.7–4.0)
MCH: 32.6 pg (ref 26.0–34.0)
MCHC: 36.1 g/dL — ABNORMAL HIGH (ref 30.0–36.0)
MCV: 90.3 fL (ref 80.0–100.0)
Monocytes Absolute: 0.4 10*3/uL (ref 0.1–1.0)
Monocytes Relative: 6 %
Neutro Abs: 3.6 10*3/uL (ref 1.7–7.7)
Neutrophils Relative %: 57 %
Platelet Count: 223 10*3/uL (ref 150–400)
RBC: 4.33 MIL/uL (ref 3.87–5.11)
RDW: 12.1 % (ref 11.5–15.5)
WBC Count: 6.3 10*3/uL (ref 4.0–10.5)
nRBC: 0 % (ref 0.0–0.2)

## 2021-08-04 LAB — CMP (CANCER CENTER ONLY)
ALT: 25 U/L (ref 0–44)
AST: 18 U/L (ref 15–41)
Albumin: 4.1 g/dL (ref 3.5–5.0)
Alkaline Phosphatase: 65 U/L (ref 38–126)
Anion gap: 6 (ref 5–15)
BUN: 20 mg/dL (ref 8–23)
CO2: 28 mmol/L (ref 22–32)
Calcium: 9.4 mg/dL (ref 8.9–10.3)
Chloride: 107 mmol/L (ref 98–111)
Creatinine: 0.87 mg/dL (ref 0.44–1.00)
GFR, Estimated: >60 mL/min (ref >60)
Glucose, Bld: 98 mg/dL (ref 70–99)
Potassium: 3.8 mmol/L (ref 3.5–5.1)
Sodium: 141 mmol/L (ref 135–145)
Total Bilirubin: 0.7 mg/dL (ref 0.3–1.2)
Total Protein: 6.5 g/dL (ref 6.5–8.1)

## 2021-08-04 NOTE — Patient Instructions (Signed)
Lung Cancer ?Lung cancer is an abnormal growth of cancerous cells that forms a mass (malignant tumor) in a lung. There are several types of lung cancer. The types are based on the appearance of the tumor cells. The two most common types are: ?Non-small cell lung cancer. This type of lung cancer is the most common type. Non-small cell lung cancers include squamous cell carcinoma, adenocarcinoma, and large cell carcinoma. ?Small cell lung cancer. In this type of lung cancer, abnormal cells are smaller than those of non-small cell lung cancer. Small cell lung cancer gets worse (progresses) faster than non-small cell lung cancer. ?What are the causes? ?The most common cause of lung cancer is smoking tobacco. The second most common cause is exposure to a chemical called radon. ?What increases the risk? ?You are more likely to develop this condition if: ?You smoke tobacco. ?You have been exposed to: ?Secondhand tobacco smoke. ?Radon gas. ?Uranium. ?Asbestos. ?Arsenic in drinking water. ?Air pollution and diesel exhaust. ?You have a family or personal history of lung cancer. ?You have had lung radiation therapy in the past. ?You are older than age 66. ?What are the signs or symptoms? ?In the early stages, you may not have any symptoms. As the cancer progresses, symptoms may include: ?A lasting cough, possibly with blood. ?Fatigue. ?Unexplained weight loss. ?Shortness of breath. ?High-pitched whistling sounds when you breathe, most often when you breathe out (wheezing). ?Chest pain. ?Loss of appetite. ?Symptoms of advanced lung cancer include: ?Hoarseness. ?Bone or joint pain. ?Weakness. ?Change in the structure of the fingernails (clubbing), so that the nail looks like an upside-down spoon. ?Swelling of the face or arms. ?Inability to move the face (paralysis). ?Drooping eyelids. ?How is this diagnosed? ?This condition may be diagnosed based on: ?Your symptoms and medical history. ?A physical exam. ?A chest X-ray. ?A CT  scan. ?Blood tests. ?Sputum tests. ?Removal of a sample of lung tissue (lung biopsy) for testing. ?Your cancer will be assessed (staged) to determine how severe it is and how much it has spread (metastasized). ?How is this treated? ?Treatment depends on the type and stage of your cancer. Treatment may include one or more of the following: ?Surgery to remove as much of the cancer as possible. Lymph nodes in the area may be removed and tested for cancer as well. ?Medicines that kill cancer cells (chemotherapy). ?High-energy rays that kill cancer cells (radiation therapy). ?Targeted therapy. This targets specific parts of cancer cells and the area around them to block the growth and spread of the cancer. Targeted therapy can help limit the damage to healthy cells. ?Immunotherapy. This treatment uses a person's own immune system to fight cancer by either boosting the immune system or changing how the immune system works. ?Follow these instructions at home: ? ?Do not use any products that contain nicotine or tobacco. These products include cigarettes, chewing tobacco, and vaping devices, such as e-cigarettes. If you need help quitting, ask your health care provider. ?Do not drink alcohol. ?If you are admitted to the hospital, make sure your cancer specialist (oncologist) is aware. Your cancer may affect your treatment for other conditions. ?Take over-the-counter and prescription medicines only as told by your health care provider. ?Work with your health care provider to manage any side effects of treatment. ?Keep all follow-up visits. This is important. ?Where to find support ?Consider joining a local support group for people who have been diagnosed with lung cancer. ?Where to find more information ?American Cancer Society: www.cancer.org ?[place] ([place]):  www.cancer.gov ?Contact a health care provider if you: ?Lose weight without trying. ?Have a persistent cough and wheezing. ?Feel short of breath. ?Get  tired easily. ?Have bone or joint pain. ?Have difficulty swallowing. ?Notice that your voice is changing or getting hoarse. ?Have pain that does not get better with medicine. ?Get help right away if you: ?Cough up blood. ?Have chest pain or new breathing problems. ?Have a fever. ?Have swelling in an ankle, leg, or arm, or the face or neck. ?Have paralysis in your face. ?Are very confused. ?Have a drooping eyelid. ?These symptoms may represent a serious problem that is an emergency. Do not wait to see if the symptoms will go away. Get medical help right away. Call your local emergency services (911 in the [place] Do not drive yourself to the hospital. ?Summary ?Lung cancer is an abnormal growth of cancerous cells that forms a mass (malignant tumor) in a lung. ?There are several types of lung cancer. The types are based on the appearance of the tumor cells. The two most common types are non-small cell and small cell. ?The most common cause of lung cancer is smoking tobacco. ?Early symptoms include a lasting cough, possibly with blood, and fatigue, unexplained weight loss, and shortness of breath. ?After diagnosis, treatment depends on the type and stage of your cancer. ?This information is not intended to replace advice given to you by your health care provider. Make sure you discuss any questions you have with your health care provider. ?Document Revised: 10/27/2020 Document Reviewed: 10/27/2020 ?Elsevier Patient Education ? [place]. ? ?
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
## 2021-08-05 DIAGNOSIS — C3431 Malignant neoplasm of lower lobe, right bronchus or lung: Secondary | ICD-10-CM | POA: Insufficient documentation

## 2021-08-08 ENCOUNTER — Encounter (HOSPITAL_BASED_OUTPATIENT_CLINIC_OR_DEPARTMENT_OTHER): Payer: Self-pay

## 2021-08-08 NOTE — Telephone Encounter (Signed)
Looks like your last note 6/22 does mention a repeat scan in 1 year, do you want her to have this before she sees you?  ?

## 2021-09-12 ENCOUNTER — Other Ambulatory Visit: Payer: Self-pay | Admitting: Thoracic Surgery (Cardiothoracic Vascular Surgery)

## 2021-09-12 DIAGNOSIS — C3431 Malignant neoplasm of lower lobe, right bronchus or lung: Secondary | ICD-10-CM

## 2021-09-13 ENCOUNTER — Ambulatory Visit
Admission: RE | Admit: 2021-09-13 | Discharge: 2021-09-13 | Disposition: A | Discharge status: Home / Self Care | Payer: Medicare Other | Source: Ambulatory Visit | Attending: Thoracic Surgery (Cardiothoracic Vascular Surgery) | Admitting: Thoracic Surgery (Cardiothoracic Vascular Surgery)

## 2021-09-13 ENCOUNTER — Ambulatory Visit (INDEPENDENT_AMBULATORY_CARE_PROVIDER_SITE_OTHER): Payer: Self-pay | Admitting: Thoracic Surgery (Cardiothoracic Vascular Surgery)

## 2021-09-13 VITALS — BP 136/88 | HR 83 | Resp 20 | Ht 62.0 in | Wt 124.0 lb

## 2021-09-13 DIAGNOSIS — C3431 Malignant neoplasm of lower lobe, right bronchus or lung: Secondary | ICD-10-CM

## 2021-09-13 DIAGNOSIS — Z9889 Other specified postprocedural states: Secondary | ICD-10-CM
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
## 2021-09-26 ENCOUNTER — Telehealth: Payer: Self-pay

## 2021-09-26 NOTE — Telephone Encounter (Signed)
Deberah Pelton, NP  Stephani Police, RN ?Hello, I am watching Karen Salinas's box while she is on vacation.  This MRI can be postponed until she is seen in follow-up for evaluation.  ?Thank you,  ?Jossie Ng. Cleaver NP-C  ?09/26/21, 4:26 PM  ? ? ?[facility] advised and note made for the pts June 2023 appt to schedule it then.  ?

## 2021-10-06 ENCOUNTER — Ambulatory Visit: Payer: Medicare Other | Admitting: Family Medicine

## 2021-10-15 ENCOUNTER — Other Ambulatory Visit (HOSPITAL_BASED_OUTPATIENT_CLINIC_OR_DEPARTMENT_OTHER): Payer: Self-pay | Admitting: Internal Medicine

## 2021-10-20 ENCOUNTER — Other Ambulatory Visit: Payer: Self-pay | Admitting: Family

## 2021-10-20 ENCOUNTER — Other Ambulatory Visit: Payer: Medicare Other | Admitting: *Deleted

## 2021-10-20 DIAGNOSIS — Z79899 Other long term (current) drug therapy: Secondary | ICD-10-CM

## 2021-10-20 DIAGNOSIS — E785 Hyperlipidemia, unspecified: Secondary | ICD-10-CM

## 2021-10-21 LAB — NMR, LIPOPROFILE
Cholesterol, Total: 137 mg/dL (ref 100–199)
HDL Particle Number: 38.5 umol/L (ref >=30.5)
HDL-C: 61 mg/dL (ref >39)
LDL Particle Number: 648 nmol/L (ref <1000)
LDL Size: 20.7 nm (ref >20.5)
LDL-C (NIH Calc): 64 mg/dL (ref 0–99)
LP-IR Score: 40 (ref <=45)
Small LDL Particle Number: 319 nmol/L (ref <=527)
Triglycerides: 54 mg/dL (ref 0–149)

## 2021-10-21 LAB — LIPID PANEL
Chol/HDL Ratio: 2.5 ratio (ref 0.0–4.4)
Cholesterol, Total: 137 mg/dL (ref 100–199)
HDL: 55 mg/dL (ref >39)
LDL Chol Calc (NIH): 69 mg/dL (ref 0–99)
Triglycerides: 61 mg/dL (ref 0–149)
VLDL Cholesterol Cal: 13 mg/dL (ref 5–40)

## 2021-10-21 LAB — COMPREHENSIVE METABOLIC PANEL
ALT: 26 IU/L (ref 0–32)
AST: 21 IU/L (ref 0–40)
Albumin/Globulin Ratio: 2.6 — ABNORMAL HIGH (ref 1.2–2.2)
Albumin: 4.7 g/dL (ref 3.8–4.8)
Alkaline Phosphatase: 73 IU/L (ref 44–121)
BUN/Creatinine Ratio: 21 (ref 12–28)
BUN: 19 mg/dL (ref 8–27)
Bilirubin Total: 0.6 mg/dL (ref 0.0–1.2)
CO2: 21 mmol/L (ref 20–29)
Calcium: 9.6 mg/dL (ref 8.7–10.3)
Chloride: 107 mmol/L — ABNORMAL HIGH (ref 96–106)
Creatinine, Ser: 0.89 mg/dL (ref 0.57–1.00)
Globulin, Total: 1.8 g/dL (ref 1.5–4.5)
Glucose: 112 mg/dL — ABNORMAL HIGH (ref 70–99)
Potassium: 4.2 mmol/L (ref 3.5–5.2)
Sodium: 141 mmol/L (ref 134–144)
Total Protein: 6.5 g/dL (ref 6.0–8.5)
eGFR: 72 mL/min/{1.73_m2} (ref >59)

## 2021-10-25 ENCOUNTER — Telehealth: Payer: Self-pay

## 2021-10-25 DIAGNOSIS — Z79899 Other long term (current) drug therapy: Secondary | ICD-10-CM

## 2021-10-25 DIAGNOSIS — E785 Hyperlipidemia, unspecified: Secondary | ICD-10-CM

## 2021-10-25 NOTE — Telephone Encounter (Signed)
The patient has been notified of the result and verbalized understanding.  All questions (if any) were answered. Stephani Police, RN 10/25/2021 12:17 PM

## 2021-10-25 NOTE — Telephone Encounter (Signed)
-----   Message from Karen Records, MD sent at 10/22/2021 10:14 AM EDT ----- Called patient Doing good after surgery  LIpids are excellent Her BP the past couple days 110s/ Cancel appt for end of June     She has seen many physicians recently Place for  Lipomed and CMET and CBC I December.     I can follow after with appt in January

## 2021-10-31 ENCOUNTER — Ambulatory Visit (INDEPENDENT_AMBULATORY_CARE_PROVIDER_SITE_OTHER): Payer: Medicare Other | Admitting: Family Medicine

## 2021-10-31 ENCOUNTER — Encounter: Payer: Self-pay | Admitting: Family Medicine

## 2021-10-31 VITALS — BP 112/68 | HR 60 | Temp 98.1°F | Ht 62.0 in | Wt 126.6 lb

## 2021-10-31 DIAGNOSIS — Z0000 Encounter for general adult medical examination without abnormal findings: Secondary | ICD-10-CM

## 2021-10-31 DIAGNOSIS — I7121 Aneurysm of the ascending aorta, without rupture: Secondary | ICD-10-CM | POA: Diagnosis not present

## 2021-10-31 DIAGNOSIS — E785 Hyperlipidemia, unspecified: Secondary | ICD-10-CM | POA: Diagnosis not present

## 2021-10-31 DIAGNOSIS — E041 Nontoxic single thyroid nodule: Secondary | ICD-10-CM | POA: Diagnosis not present

## 2021-10-31 DIAGNOSIS — I700 Atherosclerosis of aorta: Secondary | ICD-10-CM | POA: Insufficient documentation

## 2021-10-31 DIAGNOSIS — Z90710 Acquired absence of both cervix and uterus: Secondary | ICD-10-CM

## 2021-10-31 LAB — CBC WITH DIFFERENTIAL/PLATELET
Basophils Absolute: 0 10*3/uL (ref 0.0–0.1)
Basophils Relative: 0.2 % (ref 0.0–3.0)
Eosinophils Absolute: 0 10*3/uL (ref 0.0–0.7)
Eosinophils Relative: 0.5 % (ref 0.0–5.0)
HCT: 46.5 % — ABNORMAL HIGH (ref 36.0–46.0)
Hemoglobin: 15.8 g/dL — ABNORMAL HIGH (ref 12.0–15.0)
Lymphocytes Relative: 30.7 % (ref 12.0–46.0)
Lymphs Abs: 2.6 10*3/uL (ref 0.7–4.0)
MCHC: 34.1 g/dL (ref 30.0–36.0)
MCV: 93.4 fl (ref 78.0–100.0)
Monocytes Absolute: 0.6 10*3/uL (ref 0.1–1.0)
Monocytes Relative: 7.4 % (ref 3.0–12.0)
Neutro Abs: 5.1 10*3/uL (ref 1.4–7.7)
Neutrophils Relative %: 61.2 % (ref 43.0–77.0)
Platelets: 298 10*3/uL (ref 150.0–400.0)
RBC: 4.98 Mil/uL (ref 3.87–5.11)
RDW: 12.2 % (ref 11.5–15.5)
WBC: 8.3 10*3/uL (ref 4.0–10.5)

## 2021-10-31 LAB — TSH: TSH: 2.37 u[IU]/mL (ref 0.35–5.50)
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
## 2021-10-31 NOTE — Patient Instructions (Addendum)
Karen Salinas , Thank you for taking time to come for your Medicare Wellness Visit. I appreciate your ongoing commitment to your health goals. Please review the following plan we discussed and let me know if I can assist you in the future.   These are the goals we discussed: Check with pharmacy and see if you had prevnar 20- if you did not either get with them and let us know or call back for nurse visit.  Sign release of information at the check out desk for last dexa from GYN office Keep up great job with exercise Please stop by lab before you go If you have mychart- we will send your results within 3 business days of [place] receiving them.  If you do not have mychart- we will call you about results within 5 business days of [place] receiving them.  *please also note that you will see labs on mychart as soon as they post. I will later go in and write notes on them- will say "notes from Dr. Yong Channel"    This is a list of the screening recommended for you and due dates:  Health Maintenance  Topic Date Due   Pneumonia Vaccine (2 - PCV) 03/09/2006   DEXA scan (bone density measurement)  Never done   COVID-19 Vaccine (2 - Janssen risk series) 11/16/2021*   Flu Shot  12/20/2021   Mammogram  11/16/2022   Tetanus Vaccine  10/14/2026   Colon Cancer Screening  05/31/2031   Hepatitis C Screening: USPSTF Recommendation to screen - Ages 18-79 yo.  Completed   HIV Screening  Completed   Zoster (Shingles) Vaccine  Completed   HPV Vaccine  Aged Out   Pap Smear  Discontinued  *Topic was postponed. The date shown is not the original due date.

## 2021-11-01 ENCOUNTER — Other Ambulatory Visit: Payer: Self-pay | Admitting: Obstetrics & Gynecology

## 2021-11-01 DIAGNOSIS — Z1231 Encounter for screening mammogram for malignant neoplasm of breast: Secondary | ICD-10-CM

## 2021-11-16 ENCOUNTER — Ambulatory Visit
Admission: RE | Admit: 2021-11-16 | Discharge: 2021-11-16 | Disposition: A | Discharge status: Home / Self Care | Payer: Medicare Other | Source: Ambulatory Visit | Attending: Obstetrics & Gynecology | Admitting: Obstetrics & Gynecology

## 2021-11-16 ENCOUNTER — Ambulatory Visit: Payer: Medicare Other | Admitting: Internal Medicine

## 2021-11-16 DIAGNOSIS — Z1231 Encounter for screening mammogram for malignant neoplasm of breast: Secondary | ICD-10-CM

## 2021-12-01 ENCOUNTER — Telehealth: Payer: Self-pay | Admitting: Internal Medicine

## 2021-12-01 NOTE — Telephone Encounter (Signed)
Called patient regarding upcoming September appointments, patient is notified.

## 2022-01-04 ENCOUNTER — Telehealth (INDEPENDENT_AMBULATORY_CARE_PROVIDER_SITE_OTHER): Payer: Medicare Other | Admitting: Family Medicine

## 2022-01-04 ENCOUNTER — Encounter: Payer: Self-pay | Admitting: Family Medicine

## 2022-01-04 VITALS — Ht 62.0 in | Wt 125.0 lb

## 2022-01-04 DIAGNOSIS — U071 COVID-19: Secondary | ICD-10-CM | POA: Diagnosis not present

## 2022-01-04 MED ORDER — MOLNUPIRAVIR EUA 200MG CAPSULE: 4.0000 | ORAL_CAPSULE | Freq: Two times a day (BID) | ORAL | 0 refills | Status: AC

## 2022-01-04 NOTE — Progress Notes (Signed)
Phone [phone] Virtual visit via Video note   Subjective:  Chief complaint: Chief Complaint  Patient presents with   Covid Positive    Pt tested positive on 01/04/22 and c/o stuffy nose, cough and mucous. Pt denies fever she has been taking Mucinex DR for a few days. She was exposed with someone she was traveling with.   This visit type was conducted due to national recommendations for restrictions regarding the COVID-19 Pandemic (e.g. social distancing).  This format is felt to be most appropriate for this patient at this time balancing risks to patient and risks to population by having him in for in person visit.  No physical exam was performed (except for noted visual exam or audio findings with Telehealth visits).    Our team/I connected with Laymond Purser at 10:20 AM EDT by a video enabled telemedicine application (doxy.me or caregility through epic) and verified that I am speaking with the correct person using two identifiers.  Location patient: [place] Location provider: [facility], office Persons participating in the virtual visit:  patient  Our team/I discussed the limitations of evaluation and management by telemedicine and the availability of in person appointments. In light of current covid-19 pandemic, patient also understands that we are trying to protect them by minimizing in office contact if at all possible.  The patient expressed consent for telemedicine visit and agreed to proceed. Patient understands insurance will be billed.   Past Medical History-  Patient Active Problem List   Diagnosis Date Noted   Primary adenocarcinoma of lower lobe of right lung ([place]) 08/05/2021    Priority: High   Ascending aortic aneurysm ([place]) 03/17/2021    Priority: High   Aortic atherosclerosis ([place]) 10/31/2021    Priority: Medium    Essential hypertension 03/16/2015    Priority: Medium    Hyperlipidemia 06/04/2007    Priority: Medium    S/P robot-assisted surgical procedure  07/12/2021    Priority: Low   History of total hysterectomy 03/17/2021    Priority: Low    Medications- reviewed and updated Current Outpatient Medications  Medication Sig Dispense Refill   ALPRAZolam (XANAX) 0.5 MG tablet Take 0.25 mg by mouth 2 (two) times daily as needed for anxiety.     cholecalciferol (VITAMIN D3) 25 MCG (1000 UNIT) tablet Take 1,000 Units by mouth daily.     estradiol (VIVELLE-DOT) 0.05 MG/24HR patch Place 1 patch onto the skin 2 (two) times a week.     ezetimibe (ZETIA) 10 MG tablet TAKE 1 TABLET(10 MG) BY MOUTH DAILY 90 tablet 1   losartan (COZAAR) 25 MG tablet Take 1.5 tablets (37.5 mg total) by mouth daily. Please call to schedule your 1 year follow-up appointment with Dr. Harrington Challenger, due in June. 135 tablet 1   molnupiravir EUA (LAGEVRIO) 200 mg CAPS capsule Take 4 capsules (800 mg total) by mouth 2 (two) times daily for 5 days. 40 capsule 0   pravastatin (PRAVACHOL) 40 MG tablet Take 1 tablet (40 mg total) by mouth daily. Please keep upcoming appointment for future refills. Thank you. 90 tablet 0   tretinoin (RETIN-A) 0.05 % cream Apply 1 application topically daily as needed (blemishes).     No current facility-administered medications for this visit.     Objective:  Ht 5\' 2"  (1.575 m)   Wt 125 lb (56.7 kg)   BMI 22.86 kg/m  self reported vitals Gen: NAD, resting comfortably Lungs: nonlabored, normal respiratory rate  Skin: appears dry, no obvious rash  Assessment and Plan   # Covid 19 S:close contact tested positive on Sunday-  on Sunday started with post nasal drip and then yesterday started to feel more ill- nasal congestion and cough. No fever yet. Mucinex Dm helpful. Sputum without obvious coloration. Tested positive today. No sob or wheezing  A/P: Patient with testing confirming covid 19 with first day of covid 19 symptoms 01/03/22- possible early symptom PND on 01/01/22 but would be inside treatment [place] with either interval Vaccination  status:last vaccine 01/31/21   Therefore: - recommended patient watch closely for shortness of breath or confusion or worsening symptoms and if those occur patient should contact us immediately or seek care in the emergency department -recommended patient consider purchasing pulse oximeter and if levels 94% or below persistently- seek care at the hospital - Patient needs to self isolate  for at least 5 days since first symptom AND at least 24 hours fever free without fever reducing medications AND have improvement in respiratory symptoms . After 5 days can end self isolation but still needs to wear mask for additional 5 days .  -Patient should inform close contacts about exposure (anyone patient been around unmasked for more than 15 minutes)   Since High risk for complications (age, HTN)-we discussed outpatient therapeutic options including paxlovid (and risk of rebound), molnupiravir - patient opted for molnupiravir    #hyperlipidemia S: Medication:pravastatin 40 mg , zetia 10 mg Lab Results  Component Value Date   CHOL 137 10/20/2021   HDL 55 10/20/2021   LDLCALC 69 10/20/2021   LDLDIRECT 155.9 06/12/2013   TRIG 61 10/20/2021   CHOLHDL 2.5 10/20/2021   A/P: Controlled. Continue current medications. We opted to hold off on using paxlovid as she would need to hold statin and also previously tolerated molnupiravir with low SE profile   Recommended follow up: as needed for new or worsening symptoms Future Appointments  Date Time Provider [place]  02/03/2022 11:00 AM [facility] None  02/06/2022 10:00 AM Curt Bears, MD [facility] None  02/07/2022  9:30 AM [name], MD [facility]  05/10/2022  8:00 AM [facility]  11/02/2022  9:00 AM Marin Olp, MD [facility]    Lab/Order associations:   ICD-10-CM   1. COVID-19  U07.1       Meds ordered this encounter  Medications   molnupiravir EUA (LAGEVRIO)  200 mg CAPS capsule    Sig: Take 4 capsules (800 mg total) by mouth 2 (two) times daily for 5 days.    Dispense:  40 capsule    Refill:  0   Return precautions advised.  Garret Reddish, MD

## 2022-01-24 ENCOUNTER — Encounter: Payer: Self-pay | Admitting: Internal Medicine

## 2022-02-03 ENCOUNTER — Other Ambulatory Visit: Payer: Self-pay

## 2022-02-03 ENCOUNTER — Ambulatory Visit (HOSPITAL_COMMUNITY)
Admission: RE | Admit: 2022-02-03 | Discharge: 2022-02-03 | Disposition: A | Discharge status: Home / Self Care | Payer: Medicare Other | Source: Ambulatory Visit | Attending: Internal Medicine | Admitting: Internal Medicine

## 2022-02-03 ENCOUNTER — Inpatient Hospital Stay: Payer: Medicare Other | Attending: Internal Medicine

## 2022-02-03 DIAGNOSIS — C3490 Malignant neoplasm of unspecified part of unspecified bronchus or lung: Secondary | ICD-10-CM | POA: Diagnosis present

## 2022-02-03 DIAGNOSIS — I10 Essential (primary) hypertension: Secondary | ICD-10-CM | POA: Insufficient documentation

## 2022-02-03 DIAGNOSIS — C3431 Malignant neoplasm of lower lobe, right bronchus or lung: Secondary | ICD-10-CM | POA: Insufficient documentation

## 2022-02-03 DIAGNOSIS — Z79899 Other long term (current) drug therapy: Secondary | ICD-10-CM | POA: Insufficient documentation

## 2022-02-03 LAB — CMP (CANCER CENTER ONLY)
ALT: 27 U/L (ref 0–44)
AST: 21 U/L (ref 15–41)
Albumin: 4.1 g/dL (ref 3.5–5.0)
Alkaline Phosphatase: 50 U/L (ref 38–126)
Anion gap: 6 (ref 5–15)
BUN: 18 mg/dL (ref 8–23)
CO2: 25 mmol/L (ref 22–32)
Calcium: 9.5 mg/dL (ref 8.9–10.3)
Chloride: 109 mmol/L (ref 98–111)
Creatinine: 0.97 mg/dL (ref 0.44–1.00)
GFR, Estimated: >60 mL/min (ref >60)
Glucose, Bld: 87 mg/dL (ref 70–99)
Potassium: 4.3 mmol/L (ref 3.5–5.1)
Sodium: 140 mmol/L (ref 135–145)
Total Bilirubin: 0.9 mg/dL (ref 0.3–1.2)
Total Protein: 6.5 g/dL (ref 6.5–8.1)

## 2022-02-03 LAB — CBC WITH DIFFERENTIAL (CANCER CENTER ONLY)
Abs Immature Granulocytes: 0 10*3/uL (ref 0.00–0.07)
Basophils Absolute: 0 10*3/uL (ref 0.0–0.1)
Basophils Relative: 1 %
Eosinophils Absolute: 0 10*3/uL (ref 0.0–0.5)
Eosinophils Relative: 1 %
HCT: 41 % (ref 36.0–46.0)
Hemoglobin: 14.2 g/dL (ref 12.0–15.0)
Immature Granulocytes: 0 %
Lymphocytes Relative: 34 %
Lymphs Abs: 1.5 10*3/uL (ref 0.7–4.0)
MCH: 32.1 pg (ref 26.0–34.0)
MCHC: 34.6 g/dL (ref 30.0–36.0)
MCV: 92.6 fL (ref 80.0–100.0)
Monocytes Absolute: 0.3 10*3/uL (ref 0.1–1.0)
Monocytes Relative: 7 %
Neutro Abs: 2.6 10*3/uL (ref 1.7–7.7)
Neutrophils Relative %: 57 %
Platelet Count: 215 10*3/uL (ref 150–400)
RBC: 4.43 MIL/uL (ref 3.87–5.11)
RDW: 12 % (ref 11.5–15.5)
WBC Count: 4.4 10*3/uL (ref 4.0–10.5)
nRBC: 0 % (ref 0.0–0.2)

## 2022-02-03 MED ORDER — IOHEXOL 300 MG/ML  SOLN
100.0000 mL | Freq: Once | INTRAMUSCULAR | Status: AC | PRN
  Administered 2022-02-03: 100 mL via INTRAVENOUS

## 2022-02-03 MED ORDER — SODIUM CHLORIDE (PF) 0.9 % IJ SOLN
INTRAMUSCULAR | Status: AC
  Filled 2022-02-03: qty 50

## 2022-02-06 ENCOUNTER — Inpatient Hospital Stay (HOSPITAL_BASED_OUTPATIENT_CLINIC_OR_DEPARTMENT_OTHER): Payer: Medicare Other | Admitting: Internal Medicine

## 2022-02-06 VITALS — BP 161/82 | HR 86 | Temp 97.8°F | Resp 15 | Wt 131.0 lb

## 2022-02-06 DIAGNOSIS — C3431 Malignant neoplasm of lower lobe, right bronchus or lung: Secondary | ICD-10-CM | POA: Diagnosis not present

## 2022-02-06 DIAGNOSIS — C3490 Malignant neoplasm of unspecified part of unspecified bronchus or lung: Secondary | ICD-10-CM

## 2022-02-06 DIAGNOSIS — Z79899 Other long term (current) drug therapy: Secondary | ICD-10-CM | POA: Diagnosis not present

## 2022-02-06 DIAGNOSIS — I10 Essential (primary) hypertension: Secondary | ICD-10-CM | POA: Diagnosis not present

## 2022-02-06 NOTE — Progress Notes (Signed)
[facility] Telephone:([phone]) [phone]   Fax:(336) [phone]  OFFICE PROGRESS NOTE  [place], [place] [address]  DIAGNOSIS:  Stage IA (T1 a, N0, M0) non-small cell lung cancer, adenocarcinoma presented with right lower lobe groundglass opacity.  PRIOR THERAPY:  status post right lower lobe superior segmentectomy with lymph node dissection under the care of Dr. Roxan Hockey on June 23, 2021.  CURRENT THERAPY: Observation.  INTERVAL HISTORY: Karen Salinas 66 y.o. female returns to the clinic today for follow-up visit accompanied by her husband.  The patient is feeling fine today with no concerning complaints.  She denied having any chest pain, shortness of breath, cough or hemoptysis.  She denied having any fever or chills.  She has no nausea, vomiting, diarrhea or constipation.  She has no headache or visual changes.  She has no recent weight loss or night sweats.  She is here today for evaluation with repeat CT scan of the chest for restaging of her disease.  MEDICAL HISTORY: Past Medical History:  Diagnosis Date   Hyperlipidemia    on meds   Hypertension    on meds   IBS (irritable bowel syndrome)    years ago- better later in lfe   Kidney stones    hx of   Low back pain    on and off through most of life- compression fracture, prior gymnast    ALLERGIES:  is allergic to shellfish allergy.  MEDICATIONS:  Current Outpatient Medications  Medication Sig Dispense Refill   ALPRAZolam (XANAX) 0.5 MG tablet Take 0.25 mg by mouth 2 (two) times daily as needed for anxiety.     cholecalciferol (VITAMIN D3) 25 MCG (1000 UNIT) tablet Take 1,000 Units by mouth daily.     estradiol (VIVELLE-DOT) 0.05 MG/24HR patch Place 1 patch onto the skin 2 (two) times a week.     ezetimibe (ZETIA) 10 MG tablet TAKE 1 TABLET(10 MG) BY MOUTH DAILY 90 tablet 1   losartan (COZAAR) 25 MG tablet Take 1.5 tablets (37.5 mg total) by mouth daily. Please  call to schedule your 1 year follow-up appointment with Dr. Harrington Challenger, due in June. 135 tablet 1   pravastatin (PRAVACHOL) 40 MG tablet Take 1 tablet (40 mg total) by mouth daily. Please keep upcoming appointment for future refills. Thank you. 90 tablet 0   tretinoin (RETIN-A) 0.05 % cream Apply 1 application topically daily as needed (blemishes).     No current facility-administered medications for this visit.    SURGICAL HISTORY:  Past Surgical History:  Procedure Laterality Date   BREAST CYST ASPIRATION     COLONOSCOPY  2012   Medoff-MAC-mira(exc)-normal- 10 yr recall   CYSTOSCOPY WITH STENT PLACEMENT Right 12/16/2012   for stone. Procedure: RIGHT URETERAL STENT PLACEMENT;  Surgeon: Dutch Gray, MD;  Location: [facility];  Service: Urology;  Laterality: Right;   CYSTOSCOPY/RETROGRADE/URETEROSCOPY Right 12/16/2012   Procedure: CYSTOSCOPY/RETROGRADE/URETEROSCOPY;  Surgeon: Dutch Gray, MD;  Location: [facility];  Service: Urology;  Laterality: Right;   DILATION AND CURETTAGE OF UTERUS     no heartbeat   INTERCOSTAL NERVE BLOCK Right 06/23/2021   Procedure: INTERCOSTAL NERVE BLOCK;  Surgeon: [name], MD;  Location: [facility];  Service: Thoracic;  Laterality: Right;   LASIK     NODE DISSECTION Right 06/23/2021   Procedure: NODE DISSECTION;  Surgeon: [name], MD;  Location: [facility];  Service: Thoracic;  Laterality: Right;   TOTAL VAGINAL HYSTERECTOMY  2002   TUBAL LIGATION     VIDEO BRONCHOSCOPY WITH ENDOBRONCHIAL NAVIGATION N/A 06/23/2021   Procedure: VIDEO BRONCHOSCOPY WITH ENDOBRONCHIAL NAVIGATION;  Surgeon: [name], MD;  Location: [facility];  Service: Thoracic;  Laterality: N/A;   XI ROBOTIC ASSISTED THORACOSCOPY- SEGMENTECTOMY Right 06/23/2021   Procedure: XI ROBOTIC ASSISTED THORACOSCOPY-RIGHT LOWER LOBE SUPERIOR SEGMENTECTOMY;  Surgeon: [name], MD;  Location: [facility];  Service: Thoracic;  Laterality: Right;    REVIEW OF SYSTEMS:  A comprehensive review of  systems was negative.   PHYSICAL EXAMINATION: General appearance: alert, cooperative, and no distress Head: Normocephalic, without obvious abnormality, atraumatic Neck: no adenopathy, no JVD, supple, symmetrical, trachea midline, and thyroid not enlarged, symmetric, no tenderness/mass/nodules Lymph nodes: Cervical, supraclavicular, and axillary nodes normal. Resp: clear to auscultation bilaterally Back: symmetric, no curvature. ROM normal. No CVA tenderness. Cardio: regular rate and rhythm, S1, S2 normal, no murmur, click, rub or gallop GI: soft, non-tender; bowel sounds normal; no masses,  no organomegaly Extremities: extremities normal, atraumatic, no cyanosis or edema  ECOG PERFORMANCE STATUS: 1 - Symptomatic but completely ambulatory  Blood pressure (!) 161/82, pulse 86, temperature 97.8 F (36.6 C), temperature source Oral, resp. rate 15, weight 131 lb (59.4 kg), SpO2 100 %.  LABORATORY DATA: Lab Results  Component Value Date   WBC 4.4 02/03/2022   HGB 14.2 02/03/2022   HCT 41.0 02/03/2022   MCV 92.6 02/03/2022   PLT 215 02/03/2022      Chemistry      Component Value Date/Time   NA 140 02/03/2022 1057   NA 141 10/20/2021 0741   K 4.3 02/03/2022 1057   CL 109 02/03/2022 1057   CO2 25 02/03/2022 1057   BUN 18 02/03/2022 1057   BUN 19 10/20/2021 0741   CREATININE 0.97 02/03/2022 1057   CREATININE 0.96 03/22/2020 1013      Component Value Date/Time   CALCIUM 9.5 02/03/2022 1057   ALKPHOS 50 02/03/2022 1057   AST 21 02/03/2022 1057   ALT 27 02/03/2022 1057   BILITOT 0.9 02/03/2022 1057       RADIOGRAPHIC STUDIES: CT Chest W Contrast  Result Date: 02/06/2022 CLINICAL DATA:  Non-small cell lung cancer restaging, right lower lobe adenocarcinoma status post superior segmentectomy * Tracking Code: BO * EXAM: CT CHEST WITH CONTRAST TECHNIQUE: Multidetector CT imaging of the chest was performed during intravenous contrast administration. RADIATION DOSE REDUCTION: This  exam was performed according to the departmental dose-optimization program which includes automated exposure control, adjustment of the mA and/or kV according to patient size and/or use of iterative reconstruction technique. CONTRAST:  169m OMNIPAQUE IOHEXOL 300 MG/ML  SOLN COMPARISON:  PET-CT, 06/21/2021, CT chest, 06/06/2021 FINDINGS: Cardiovascular: No significant vascular findings. Normal heart size. No pericardial effusion. Mediastinum/Nodes: No enlarged mediastinal, hilar, or axillary lymph nodes. Thyroid gland, trachea, and esophagus demonstrate no significant findings. Lungs/Pleura: Status post interval right lower lobe superior segment wedge resection with associated bandlike postoperative scarring (series 7, image 77). No pleural effusion or pneumothorax. Upper Abdomen: No acute abnormality. Musculoskeletal: No chest wall abnormality. No acute osseous findings. IMPRESSION: Status post interval right lower lobe superior segmentectomy with associated postoperative scarring. No evidence of recurrent or metastatic disease in the chest at postoperative baseline. Electronically Signed   By: ADelanna AhmadiM.D.   On: 02/06/2022 05:53    ASSESSMENT AND PLAN: This is a very pleasant 66years old white female with stage Ia (T1 a, N0, M0) non-small cell lung cancer, adenocarcinoma presented with  right lower lobe groundglass opacities status post right lower lobe superior segmentectomy with lymph node dissection under the care of Dr. Roxan Hockey on June 23, 2021. The patient is currently on observation and she is feeling fine with no concerning complaints. She had repeat CT scan of the chest performed recently.  I personally and independently reviewed the scan and discussed the result with the patient and her husband. Her scan showed no concerning findings for disease recurrence or metastasis. I recommended for her to continue on observation with repeat CT scan of the chest in 6 months. For the hypertension  she mentioned that her blood pressure is usually normal at home.  She has whitecoat syndrome. The patient was advised to call immediately if she has any other concerning symptoms in the interval. The patient voices understanding of current disease status and treatment options and is in agreement with the current care plan.  All questions were answered. The patient knows to call the clinic with any problems, questions or concerns. We can certainly see the patient much sooner if necessary.  The total time spent in the appointment was 20 minutes.  Disclaimer: This note was dictated with voice recognition software. Similar sounding words can inadvertently be transcribed and may not be corrected upon review.

## 2022-02-07 ENCOUNTER — Ambulatory Visit (INDEPENDENT_AMBULATORY_CARE_PROVIDER_SITE_OTHER): Payer: Medicare Other | Admitting: Thoracic Surgery (Cardiothoracic Vascular Surgery)

## 2022-02-07 VITALS — BP 143/90 | HR 96 | Resp 18 | Ht 62.0 in | Wt 126.0 lb

## 2022-02-07 DIAGNOSIS — C3431 Malignant neoplasm of lower lobe, right bronchus or lung: Secondary | ICD-10-CM | POA: Diagnosis not present

## 2022-02-07 DIAGNOSIS — Z9889 Other specified postprocedural states: Secondary | ICD-10-CM
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
## 2022-02-13 ENCOUNTER — Encounter: Payer: Self-pay | Admitting: *Deleted

## 2022-04-03 ENCOUNTER — Other Ambulatory Visit: Payer: Self-pay

## 2022-04-03 MED ORDER — LOSARTAN POTASSIUM 25 MG PO TABS: 37.5000 mg | ORAL_TABLET | Freq: Every day | ORAL | 0 refills | Status: DC

## 2022-04-05 ENCOUNTER — Encounter: Payer: Self-pay | Admitting: Internal Medicine

## 2022-04-11 ENCOUNTER — Other Ambulatory Visit: Payer: Self-pay

## 2022-04-11 DIAGNOSIS — E785 Hyperlipidemia, unspecified: Secondary | ICD-10-CM

## 2022-04-11 MED ORDER — EZETIMIBE 10 MG PO TABS: ORAL_TABLET | ORAL | 0 refills | Status: DC

## 2022-05-01 ENCOUNTER — Other Ambulatory Visit: Payer: Self-pay

## 2022-05-01 MED ORDER — PRAVASTATIN SODIUM 40 MG PO TABS: 40.0000 mg | ORAL_TABLET | Freq: Every day | ORAL | 1 refills | Status: DC

## 2022-05-02 ENCOUNTER — Encounter: Payer: Self-pay | Admitting: Family Medicine

## 2022-05-02 DIAGNOSIS — Z818 Family history of other mental and behavioral disorders: Secondary | ICD-10-CM

## 2022-05-08 ENCOUNTER — Ambulatory Visit: Payer: Medicare Other | Attending: Internal Medicine

## 2022-05-08 DIAGNOSIS — E785 Hyperlipidemia, unspecified: Secondary | ICD-10-CM

## 2022-05-08 DIAGNOSIS — Z79899 Other long term (current) drug therapy: Secondary | ICD-10-CM

## 2022-05-09 LAB — CBC
Hematocrit: 43.5 % (ref 34.0–46.6)
Hemoglobin: 14.8 g/dL (ref 11.1–15.9)
MCH: 31.4 pg (ref 26.6–33.0)
MCHC: 34 g/dL (ref 31.5–35.7)
MCV: 92 fL (ref 79–97)
Platelets: 305 10*3/uL (ref 150–450)
RBC: 4.72 x10E6/uL (ref 3.77–5.28)
RDW: 11.9 % (ref 11.7–15.4)
WBC: 7.5 10*3/uL (ref 3.4–10.8)

## 2022-05-09 LAB — COMPREHENSIVE METABOLIC PANEL
ALT: 25 IU/L (ref 0–32)
AST: 13 IU/L (ref 0–40)
Albumin/Globulin Ratio: 2.5 — ABNORMAL HIGH (ref 1.2–2.2)
Albumin: 4.3 g/dL (ref 3.9–4.9)
Alkaline Phosphatase: 64 IU/L (ref 44–121)
BUN/Creatinine Ratio: 18 (ref 12–28)
BUN: 18 mg/dL (ref 8–27)
Bilirubin Total: 1.1 mg/dL (ref 0.0–1.2)
CO2: 26 mmol/L (ref 20–29)
Calcium: 9.4 mg/dL (ref 8.7–10.3)
Chloride: 105 mmol/L (ref 96–106)
Creatinine, Ser: 1.01 mg/dL — ABNORMAL HIGH (ref 0.57–1.00)
Globulin, Total: 1.7 g/dL (ref 1.5–4.5)
Glucose: 93 mg/dL (ref 70–99)
Potassium: 4.5 mmol/L (ref 3.5–5.2)
Sodium: 143 mmol/L (ref 134–144)
Total Protein: 6 g/dL (ref 6.0–8.5)
eGFR: 61 mL/min/{1.73_m2} (ref >59)

## 2022-05-09 LAB — NMR, LIPOPROFILE
Cholesterol, Total: 142 mg/dL (ref 100–199)
HDL Particle Number: 36.2 umol/L (ref >=30.5)
HDL-C: 56 mg/dL (ref >39)
LDL Particle Number: 717 nmol/L (ref <1000)
LDL Size: 21.6 nm (ref >20.5)
LDL-C (NIH Calc): 72 mg/dL (ref 0–99)
LP-IR Score: 31 (ref <=45)
Small LDL Particle Number: 281 nmol/L (ref <=527)
Triglycerides: 68 mg/dL (ref 0–149)

## 2022-05-10 ENCOUNTER — Other Ambulatory Visit: Payer: Medicare Other

## 2022-05-27 ENCOUNTER — Other Ambulatory Visit: Payer: Self-pay | Admitting: Internal Medicine

## 2022-05-27 DIAGNOSIS — E785 Hyperlipidemia, unspecified: Secondary | ICD-10-CM

## 2022-05-28 ENCOUNTER — Encounter: Payer: Self-pay | Admitting: Internal Medicine

## 2022-05-28 DIAGNOSIS — E785 Hyperlipidemia, unspecified: Secondary | ICD-10-CM

## 2022-05-29 ENCOUNTER — Other Ambulatory Visit: Payer: Self-pay | Admitting: Internal Medicine

## 2022-05-29 MED ORDER — PRAVASTATIN SODIUM 40 MG PO TABS: 40.0000 mg | ORAL_TABLET | Freq: Every day | ORAL | 0 refills | Status: DC

## 2022-05-29 MED ORDER — EZETIMIBE 10 MG PO TABS: ORAL_TABLET | ORAL | 0 refills | Status: DC

## 2022-05-30 ENCOUNTER — Encounter: Payer: Self-pay | Admitting: Psychology

## 2022-06-07 ENCOUNTER — Ambulatory Visit: Payer: Medicare Other | Admitting: Internal Medicine

## 2022-06-09 ENCOUNTER — Encounter: Payer: Self-pay | Admitting: Internal Medicine

## 2022-06-09 ENCOUNTER — Ambulatory Visit: Payer: Medicare Other | Attending: Internal Medicine | Admitting: Internal Medicine

## 2022-06-09 VITALS — BP 134/82 | HR 63 | Ht 62.0 in | Wt 133.8 lb

## 2022-06-09 DIAGNOSIS — E785 Hyperlipidemia, unspecified: Secondary | ICD-10-CM | POA: Diagnosis not present

## 2022-06-09 DIAGNOSIS — Z1321 Encounter for screening for nutritional disorder: Secondary | ICD-10-CM | POA: Diagnosis not present

## 2022-06-09 DIAGNOSIS — Z79899 Other long term (current) drug therapy: Secondary | ICD-10-CM

## 2022-06-09 DIAGNOSIS — I10 Essential (primary) hypertension: Secondary | ICD-10-CM | POA: Diagnosis not present

## 2022-06-09 DIAGNOSIS — R5383 Other fatigue: Secondary | ICD-10-CM

## 2022-06-09 DIAGNOSIS — E559 Vitamin D deficiency, unspecified: Secondary | ICD-10-CM

## 2022-06-09 NOTE — Progress Notes (Signed)
Cardiology Office Note   Date:  06/09/2022   ID:  [place], [place] [birth year], MRN [id number]  PCP:  [place] Olp, MD  Cardiologist:   Dorris Carnes, MD   Patient presents for F/U of hyperlipidemia    History of Present Illness: GLORENE LEITZKE is a 67 y.o. female with a history of HL, HTN, adeno CA of lung (s/p RLL segmentectomy in 2023), minimally dilated aorta. I saw her back in 2018    Calcium score in 2018 was 0   CT of abdomen in past had minimal calcium   THe pt was last in cardiology in June 2022 (seen by Vella Raring NP).  Since seen she has done OK   Denies CP  Breathing is OK  NO dizziness  No palpitations    BP at home is good 110s/.   Current Meds  Medication Sig   ALPRAZolam (XANAX) 0.5 MG tablet Take 0.25 mg by mouth 2 (two) times daily as needed for anxiety.   cholecalciferol (VITAMIN D3) 25 MCG (1000 UNIT) tablet Take 1,000 Units by mouth daily.   estradiol (VIVELLE-DOT) 0.05 MG/24HR patch Place 1 patch onto the skin 2 (two) times a week.   ezetimibe (ZETIA) 10 MG tablet TAKE 1 TABLET(10 MG) BY MOUTH DAILY   losartan (COZAAR) 25 MG tablet Take 1.5 tablets (37.5 mg total) by mouth daily.   pravastatin (PRAVACHOL) 40 MG tablet Take 1 tablet (40 mg total) by mouth daily.   tretinoin (RETIN-A) 0.05 % cream Apply 1 application topically daily as needed (blemishes).     Allergies:   Shellfish allergy   Past Medical History:  Diagnosis Date   Hyperlipidemia    on meds   Hypertension    on meds   IBS (irritable bowel syndrome)    years ago- better later in lfe   Kidney stones    hx of   Low back pain    on and off through most of life- compression fracture, prior gymnast    Past Surgical History:  Procedure Laterality Date   BREAST CYST ASPIRATION     COLONOSCOPY  2012   Medoff-MAC-mira(exc)-normal- 10 yr recall   CYSTOSCOPY WITH STENT PLACEMENT Right 12/16/2012   for stone. Procedure: RIGHT URETERAL STENT PLACEMENT;  Surgeon: Dutch Gray, MD;  Location:  [facility];  Service: Urology;  Laterality: Right;   CYSTOSCOPY/RETROGRADE/URETEROSCOPY Right 12/16/2012   Procedure: CYSTOSCOPY/RETROGRADE/URETEROSCOPY;  Surgeon: Dutch Gray, MD;  Location: [facility];  Service: Urology;  Laterality: Right;   DILATION AND CURETTAGE OF UTERUS     no heartbeat   INTERCOSTAL NERVE BLOCK Right 06/23/2021   Procedure: INTERCOSTAL NERVE BLOCK;  Surgeon: [name], MD;  Location: [facility];  Service: Thoracic;  Laterality: Right;   LASIK     NODE DISSECTION Right 06/23/2021   Procedure: NODE DISSECTION;  Surgeon: [name], MD;  Location: [facility];  Service: Thoracic;  Laterality: Right;   TOTAL VAGINAL HYSTERECTOMY  2002   TUBAL LIGATION     VIDEO BRONCHOSCOPY WITH ENDOBRONCHIAL NAVIGATION N/A 06/23/2021   Procedure: VIDEO BRONCHOSCOPY WITH ENDOBRONCHIAL NAVIGATION;  Surgeon: [name], MD;  Location: [facility];  Service: Thoracic;  Laterality: N/A;   XI ROBOTIC ASSISTED THORACOSCOPY- SEGMENTECTOMY Right 06/23/2021   Procedure: XI ROBOTIC ASSISTED THORACOSCOPY-RIGHT LOWER LOBE SUPERIOR SEGMENTECTOMY;  Surgeon: [name], MD;  Location: [facility];  Service: Thoracic;  Laterality: Right;     Social History:  The patient  reports that she has never  smoked. She has never used smokeless tobacco. She reports that she does not currently use alcohol. She reports that she does not use drugs.   Family History:  The patient's family history includes Colon polyps (age of onset: 16) in her brother; High Cholesterol in her brother; Hyperlipidemia in her father; Hypertension in her father, mother, and sister; Ovarian cancer in her maternal grandmother; Prostate cancer in her brother and paternal grandfather; Sleep apnea in her brother; Stroke in her brother and paternal grandmother.    ROS:  Please see the history of present illness. All other systems are reviewed and  Negative to the above problem except as noted.    PHYSICAL EXAM: VS:  BP 134/82   Pulse  63   Ht _0  (1.575 m)   Wt 133 lb 12.8 oz (60.7 kg)   SpO2 97%   BMI 24.47 kg/m   GEN: Well nourished, well developed, in no acute distress  HEENT: normal  Neck: no JVD  No carotid bruit Cardiac: RRR; no murmur  NO LE  edema  Respiratory:  clear to auscultation bilaterally  GI: soft, nontender, nondistended, + BS  No hepatomegaly  MS: no deformity Moving all extremities   Skin: warm and dry, no rash Neuro:  Strength and sensation are intact Psych: euthymic mood, full affect   EKG:  EKG is ordered today.  SR 63 bpm    Lipid Panel    Component Value Date/Time   CHOL 137 10/20/2021 0741   TRIG 61 10/20/2021 0741   HDL 55 10/20/2021 0741   CHOLHDL 2.5 10/20/2021 0741   CHOLHDL 4.4 03/22/2020 1013   VLDL 16.8 11/08/2018 1117   LDLCALC 69 10/20/2021 0741   LDLCALC 135 (H) 03/22/2020 1013   LDLDIRECT 155.9 06/12/2013 0837      Wt Readings from Last 3 Encounters:  06/09/22 133 lb 12.8 oz (60.7 kg)  02/07/22 126 lb (57.2 kg)  02/06/22 131 lb (59.4 kg)      ASSESSMENT AND PLAN:  1  Dyslipidemia   Lipids in Dec LDL 72  HDL 56  Trig 68  COntinue   WIll check Lipids and LFTs in 6 months   2  Blood pressure  BP is controlled at home   Continue meds   3  Aortic dilation   Recent CT aorta is OK  WIll check  A1C and VIt D     F/U in 12 months in clinic   Current medicines are reviewed at length with the patient today.  The patient does not have concerns regarding medicines.  Signed, Dorris Carnes, MD  06/09/2022 4:07 PM    [facility] [place], [place], [place]  [address] Phone: [phone]; Fax: [phone]

## 2022-06-09 NOTE — Patient Instructions (Signed)
Medication Instructions:   *If you need a refill on your cardiac medications before your next appointment, please call your pharmacy*   Lab Work: Hgba1c and vit d today  Nmr and hepatic fasting in 6 months   If you have labs (blood work) drawn today and your tests are completely normal, you will receive your results only by: MyChart Message (if you have MyChart) OR A paper copy in the mail If you have any lab test that is abnormal or we need to change your treatment, we will call you to review the results.   Testing/Procedures:    Follow-Up: At [facility], you and your health needs are our priority.  As part of our continuing mission to provide you with exceptional heart care, we have created designated Provider Care Teams.  These Care Teams include your primary Cardiologist (physician) and Advanced Practice Providers (APPs -  Physician Assistants and Nurse Practitioners) who all work together to provide you with the care you need, when you need it.  We recommend signing up for the patient portal called "MyChart".  Sign up information is provided on this After Visit Summary.  MyChart is used to connect with patients for Virtual Visits (Telemedicine).  Patients are able to view lab/test results, encounter notes, upcoming appointments, etc.  Non-urgent messages can be sent to your provider as well.   To learn more about what you can do with MyChart, go to [email or web address].    Your next appointment:   1 year(s)  Provider:   Dietrich Pates, MD     Other Instructions

## 2022-06-10 LAB — HEMOGLOBIN A1C
Est. average glucose Bld gHb Est-mCnc: 111 mg/dL
Hgb A1c MFr Bld: 5.5 % (ref 4.8–5.6)

## 2022-06-10 LAB — VITAMIN D 25 HYDROXY (VIT D DEFICIENCY, FRACTURES): Vit D, 25-Hydroxy: 59.1 ng/mL (ref 30.0–100.0)

## 2022-06-11 ENCOUNTER — Encounter: Payer: Self-pay | Admitting: Internal Medicine

## 2022-06-15 ENCOUNTER — Encounter: Payer: Self-pay | Admitting: Internal Medicine

## 2022-06-15 DIAGNOSIS — E785 Hyperlipidemia, unspecified: Secondary | ICD-10-CM

## 2022-06-25 ENCOUNTER — Other Ambulatory Visit: Payer: Self-pay | Admitting: Internal Medicine

## 2022-06-25 DIAGNOSIS — E785 Hyperlipidemia, unspecified: Secondary | ICD-10-CM

## 2022-06-26 MED ORDER — LOSARTAN POTASSIUM 25 MG PO TABS: 37.5000 mg | ORAL_TABLET | Freq: Every day | ORAL | 3 refills | Status: DC

## 2022-06-26 MED ORDER — EZETIMIBE 10 MG PO TABS: ORAL_TABLET | ORAL | 3 refills | Status: DC

## 2022-06-26 MED ORDER — PRAVASTATIN SODIUM 40 MG PO TABS: 40.0000 mg | ORAL_TABLET | Freq: Every day | ORAL | 3 refills | Status: DC

## 2022-08-03 ENCOUNTER — Inpatient Hospital Stay: Payer: Medicare Other | Attending: Nurse Practitioner

## 2022-08-03 ENCOUNTER — Ambulatory Visit (HOSPITAL_COMMUNITY)
Admission: RE | Admit: 2022-08-03 | Discharge: 2022-08-03 | Disposition: A | Discharge status: Home / Self Care | Payer: Medicare Other | Source: Ambulatory Visit | Attending: Internal Medicine | Admitting: Internal Medicine

## 2022-08-03 ENCOUNTER — Other Ambulatory Visit: Payer: Self-pay

## 2022-08-03 DIAGNOSIS — C3490 Malignant neoplasm of unspecified part of unspecified bronchus or lung: Secondary | ICD-10-CM | POA: Insufficient documentation

## 2022-08-03 DIAGNOSIS — C3431 Malignant neoplasm of lower lobe, right bronchus or lung: Secondary | ICD-10-CM | POA: Diagnosis not present

## 2022-08-03 LAB — CMP (CANCER CENTER ONLY)
ALT: 15 U/L (ref 0–44)
AST: 16 U/L (ref 15–41)
Albumin: 4.3 g/dL (ref 3.5–5.0)
Alkaline Phosphatase: 55 U/L (ref 38–126)
Anion gap: 6 (ref 5–15)
BUN: 20 mg/dL (ref 8–23)
CO2: 27 mmol/L (ref 22–32)
Calcium: 9.4 mg/dL (ref 8.9–10.3)
Chloride: 110 mmol/L (ref 98–111)
Creatinine: 1.03 mg/dL — ABNORMAL HIGH (ref 0.44–1.00)
GFR, Estimated: 60 mL/min — ABNORMAL LOW (ref >60)
Glucose, Bld: 100 mg/dL — ABNORMAL HIGH (ref 70–99)
Potassium: 4.6 mmol/L (ref 3.5–5.1)
Sodium: 143 mmol/L (ref 135–145)
Total Bilirubin: 0.8 mg/dL (ref 0.3–1.2)
Total Protein: 6.7 g/dL (ref 6.5–8.1)

## 2022-08-03 LAB — CBC WITH DIFFERENTIAL (CANCER CENTER ONLY)
Abs Immature Granulocytes: 0.01 10*3/uL (ref 0.00–0.07)
Basophils Absolute: 0 10*3/uL (ref 0.0–0.1)
Basophils Relative: 1 %
Eosinophils Absolute: 0.1 10*3/uL (ref 0.0–0.5)
Eosinophils Relative: 1 %
HCT: 44.3 % (ref 36.0–46.0)
Hemoglobin: 15.2 g/dL — ABNORMAL HIGH (ref 12.0–15.0)
Immature Granulocytes: 0 %
Lymphocytes Relative: 48 %
Lymphs Abs: 2.3 10*3/uL (ref 0.7–4.0)
MCH: 32.1 pg (ref 26.0–34.0)
MCHC: 34.3 g/dL (ref 30.0–36.0)
MCV: 93.5 fL (ref 80.0–100.0)
Monocytes Absolute: 0.4 10*3/uL (ref 0.1–1.0)
Monocytes Relative: 7 %
Neutro Abs: 2.1 10*3/uL (ref 1.7–7.7)
Neutrophils Relative %: 43 %
Platelet Count: 237 10*3/uL (ref 150–400)
RBC: 4.74 MIL/uL (ref 3.87–5.11)
RDW: 11.5 % (ref 11.5–15.5)
WBC Count: 4.9 10*3/uL (ref 4.0–10.5)
nRBC: 0 % (ref 0.0–0.2)

## 2022-08-03 MED ORDER — IOHEXOL 300 MG/ML  SOLN
75.0000 mL | Freq: Once | INTRAMUSCULAR | Status: AC | PRN
  Administered 2022-08-03: 75 mL via INTRAVENOUS

## 2022-08-07 ENCOUNTER — Ambulatory Visit: Payer: Medicare Other | Admitting: Internal Medicine

## 2022-08-10 ENCOUNTER — Inpatient Hospital Stay (HOSPITAL_BASED_OUTPATIENT_CLINIC_OR_DEPARTMENT_OTHER): Payer: Medicare Other | Admitting: Internal Medicine

## 2022-08-10 VITALS — BP 127/73 | HR 80 | Temp 97.8°F | Resp 18 | Wt 130.5 lb

## 2022-08-10 DIAGNOSIS — C3431 Malignant neoplasm of lower lobe, right bronchus or lung: Secondary | ICD-10-CM | POA: Diagnosis not present

## 2022-08-10 DIAGNOSIS — C3490 Malignant neoplasm of unspecified part of unspecified bronchus or lung: Secondary | ICD-10-CM

## 2022-08-10 NOTE — Progress Notes (Signed)
[place] [phone]   [phone]  OFFICE PROGRESS NOTE  [address], [place] [address]  DIAGNOSIS:  Stage IA (T1 a, N0, M0) non-small cell lung cancer, adenocarcinoma presented with right lower lobe groundglass opacity.  PRIOR THERAPY:  status post right lower lobe superior segmentectomy with lymph node dissection under the care of Dr. Roxan Hockey on June 23, 2021.  CURRENT THERAPY: Observation.  INTERVAL HISTORY: Karen Salinas 67 y.o. female turns to the clinic today for 6 months follow-up visit accompanied by her husband.  The patient is feeling fine today with no concerning complaints.  She has no chest pain, shortness of breath, cough or hemoptysis.  She has no nausea, vomiting, diarrhea or constipation.  She denied having any headache or visual changes.  She has no recent weight loss or night sweats.  She is here today for evaluation with repeat CT scan of the chest for restaging of her disease.  MEDICAL HISTORY: Past Medical History:  Diagnosis Date   Hyperlipidemia    on meds   Hypertension    on meds   IBS (irritable bowel syndrome)    years ago- better later in lfe   Kidney stones    hx of   Low back pain    on and off through most of life- compression fracture, prior gymnast    ALLERGIES:  is allergic to shellfish allergy.  MEDICATIONS:  Current Outpatient Medications  Medication Sig Dispense Refill   ALPRAZolam (XANAX) 0.5 MG tablet Take 0.25 mg by mouth 2 (two) times daily as needed for anxiety.     cholecalciferol (VITAMIN D3) 25 MCG (1000 UNIT) tablet Take 1,000 Units by mouth daily.     estradiol (VIVELLE-DOT) 0.05 MG/24HR patch Place 1 patch onto the skin 2 (two) times a week.     ezetimibe (ZETIA) 10 MG tablet TAKE 1 TABLET(10 MG) BY MOUTH DAILY 90 tablet 3   losartan (COZAAR) 25 MG tablet Take 1.5 tablets (37.5 mg total) by mouth daily. 135 tablet 3   pravastatin (PRAVACHOL) 40  MG tablet Take 1 tablet (40 mg total) by mouth daily. 90 tablet 3   tretinoin (RETIN-A) 0.05 % cream Apply 1 application topically daily as needed (blemishes).     No current facility-administered medications for this visit.    SURGICAL HISTORY:  Past Surgical History:  Procedure Laterality Date   BREAST CYST ASPIRATION     COLONOSCOPY  2012   Medoff-MAC-mira(exc)-normal- 10 yr recall   CYSTOSCOPY WITH STENT PLACEMENT Right 12/16/2012   for stone. Procedure: RIGHT URETERAL STENT PLACEMENT;  Surgeon: Dutch Gray, MD;  Location: [facility];  Service: Urology;  Laterality: Right;   CYSTOSCOPY/RETROGRADE/URETEROSCOPY Right 12/16/2012   Procedure: CYSTOSCOPY/RETROGRADE/URETEROSCOPY;  Surgeon: Dutch Gray, MD;  Location: [facility];  Service: Urology;  Laterality: Right;   DILATION AND CURETTAGE OF UTERUS     no heartbeat   INTERCOSTAL NERVE BLOCK Right 06/23/2021   Procedure: INTERCOSTAL NERVE BLOCK;  Surgeon: [name], MD;  Location: [facility];  Service: Thoracic;  Laterality: Right;   LASIK     NODE DISSECTION Right 06/23/2021   Procedure: NODE DISSECTION;  Surgeon: [name], MD;  Location: [facility] OR;  Service: Thoracic;  Laterality: Right;   TOTAL VAGINAL HYSTERECTOMY  2002   TUBAL LIGATION     VIDEO BRONCHOSCOPY WITH ENDOBRONCHIAL NAVIGATION N/A 06/23/2021   Procedure: VIDEO BRONCHOSCOPY WITH ENDOBRONCHIAL NAVIGATION;  Surgeon: [name],  MD;  Location: [facility] OR;  Service: Thoracic;  Laterality: N/A;   XI ROBOTIC ASSISTED THORACOSCOPY- SEGMENTECTOMY Right 06/23/2021   Procedure: XI ROBOTIC ASSISTED THORACOSCOPY-RIGHT LOWER LOBE SUPERIOR SEGMENTECTOMY;  Surgeon: [name], MD;  Location:  [facility] OR;  Service: Thoracic;  Laterality: Right;    REVIEW OF SYSTEMS:  A comprehensive review of systems was negative.   PHYSICAL EXAMINATION: General appearance: alert, cooperative, and no distress Head: Normocephalic, without obvious abnormality, atraumatic Neck: no adenopathy,  no JVD, supple, symmetrical, trachea midline, and thyroid not enlarged, symmetric, no tenderness/mass/nodules Lymph nodes: Cervical, supraclavicular, and axillary nodes normal. Resp: clear to auscultation bilaterally Back: symmetric, no curvature. ROM normal. No CVA tenderness. Cardio: regular rate and rhythm, S1, S2 normal, no murmur, click, rub or gallop GI: soft, non-tender; bowel sounds normal; no masses,  no organomegaly Extremities: extremities normal, atraumatic, no cyanosis or edema  ECOG PERFORMANCE STATUS: 1 - Symptomatic but completely ambulatory  Blood pressure 127/73, pulse 80, temperature 97.8 F (36.6 C), temperature source Oral, resp. rate 18, weight 130 lb 8 oz (59.2 kg), SpO2 100 %.  LABORATORY DATA: Lab Results  Component Value Date   WBC 4.9 08/03/2022   HGB 15.2 (H) 08/03/2022   HCT 44.3 08/03/2022   MCV 93.5 08/03/2022   PLT 237 08/03/2022      Chemistry      Component Value Date/Time   NA 143 08/03/2022 0730   NA 143 05/08/2022 0731   K 4.6 08/03/2022 0730   CL 110 08/03/2022 0730   CO2 27 08/03/2022 0730   BUN 20 08/03/2022 0730   BUN 18 05/08/2022 0731   CREATININE 1.03 (H) 08/03/2022 0730   CREATININE 0.96 03/22/2020 1013      Component Value Date/Time   CALCIUM 9.4 08/03/2022 0730   ALKPHOS 55 08/03/2022 0730   AST 16 08/03/2022 0730   ALT 15 08/03/2022 0730   BILITOT 0.8 08/03/2022 0730       RADIOGRAPHIC STUDIES: CT Chest W Contrast  Result Date: 08/07/2022 CLINICAL DATA:  Non-small cell lung cancer, staging. * Tracking Code: BO * EXAM: CT CHEST WITH CONTRAST TECHNIQUE: Multidetector CT imaging of the chest was performed during intravenous contrast administration. RADIATION DOSE REDUCTION: This exam was performed according to the departmental dose-optimization program which includes automated exposure control, adjustment of the mA and/or kV according to patient size and/or use of iterative reconstruction technique. CONTRAST:  67mL  OMNIPAQUE IOHEXOL 300 MG/ML  SOLN COMPARISON:  Chest CT 02/03/2022 and 06/06/2021.  PET-CT 06/21/2021. FINDINGS: Cardiovascular: There are no significant vascular findings. The heart size is normal. There is no pericardial effusion. Mediastinum/Nodes: There are no enlarged mediastinal, hilar or axillary lymph nodes. The thyroid gland, trachea and esophagus demonstrate no significant findings. Lungs/Pleura: No pleural effusion or pneumothorax. There are stable postsurgical changes related to right lower lobe wedge resection with mild residual bandlike scarring. No evidence of local recurrence or suspicious pulmonary nodule. Upper abdomen: The visualized upper abdomen appears stable, without significant findings. Musculoskeletal/Chest wall: There is no chest wall mass or suspicious osseous finding. IMPRESSION: 1. Stable chest CT status post right lower lobe wedge resection. 2. No evidence of local recurrence or metastatic disease. Electronically Signed   By: Richardean Sale M.D.   On: 08/07/2022 08:23    ASSESSMENT AND PLAN: This is a very pleasant 67 years old white female with stage IA (T1 a, N0, M0) non-small cell lung cancer, adenocarcinoma presented with right lower lobe groundglass opacities status post right lower lobe superior  segmentectomy with lymph node dissection under the care of Dr. Roxan Hockey on June 23, 2021. The patient is currently on observation and she is feeling fine with no concerning issues. She had repeat CT scan of the chest performed recently.  I personally and independently reviewed the scan and discussed the result with the patient and her husband. Her scan showed no concerning findings for disease recurrence or metastasis. I recommended for her to continue on observation with repeat CT scan of the chest in 6 months. She was advised to call immediately if she has any concerning symptoms in the interval. The patient voices understanding of current disease status and treatment  options and is in agreement with the current care plan.  All questions were answered. The patient knows to call the clinic with any problems, questions or concerns. We can certainly see the patient much sooner if necessary.  The total time spent in the appointment was 20 minutes.  Disclaimer: This note was dictated with voice recognition software. Similar sounding words can inadvertently be transcribed and may not be corrected upon review.

## 2022-08-22 ENCOUNTER — Ambulatory Visit (INDEPENDENT_AMBULATORY_CARE_PROVIDER_SITE_OTHER): Payer: Medicare Other | Admitting: Thoracic Surgery (Cardiothoracic Vascular Surgery)

## 2022-08-22 VITALS — BP 130/79 | HR 95 | Resp 20 | Ht 62.0 in | Wt 129.0 lb

## 2022-08-22 DIAGNOSIS — Z9889 Other specified postprocedural states: Secondary | ICD-10-CM | POA: Diagnosis not present

## 2022-08-22 DIAGNOSIS — C3431 Malignant neoplasm of lower lobe, right bronchus or lung: Secondary | ICD-10-CM
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
## 2022-09-13 ENCOUNTER — Other Ambulatory Visit: Payer: Self-pay | Admitting: Family

## 2022-10-03 ENCOUNTER — Other Ambulatory Visit: Payer: Self-pay | Admitting: Obstetrics & Gynecology

## 2022-10-03 DIAGNOSIS — Z0000 Encounter for general adult medical examination without abnormal findings: Secondary | ICD-10-CM

## 2022-11-02 ENCOUNTER — Ambulatory Visit (INDEPENDENT_AMBULATORY_CARE_PROVIDER_SITE_OTHER): Payer: Medicare Other | Admitting: Family Medicine

## 2022-11-02 ENCOUNTER — Encounter: Payer: Self-pay | Admitting: Family Medicine

## 2022-11-02 VITALS — BP 113/66 | HR 73 | Temp 97.9°F | Ht 62.0 in | Wt 130.0 lb

## 2022-11-02 DIAGNOSIS — I700 Atherosclerosis of aorta: Secondary | ICD-10-CM | POA: Diagnosis not present

## 2022-11-02 DIAGNOSIS — E785 Hyperlipidemia, unspecified: Secondary | ICD-10-CM

## 2022-11-02 DIAGNOSIS — I7121 Aneurysm of the ascending aorta, without rupture: Secondary | ICD-10-CM | POA: Diagnosis not present

## 2022-11-02 DIAGNOSIS — I10 Essential (primary) hypertension: Secondary | ICD-10-CM | POA: Diagnosis not present

## 2022-11-02 LAB — COMPREHENSIVE METABOLIC PANEL
ALT: 20 U/L (ref 0–35)
AST: 17 U/L (ref 0–37)
Albumin: 4.2 g/dL (ref 3.5–5.2)
Alkaline Phosphatase: 55 U/L (ref 39–117)
BUN: 16 mg/dL (ref 6–23)
CO2: 29 mEq/L (ref 19–32)
Calcium: 9.2 mg/dL (ref 8.4–10.5)
Chloride: 106 mEq/L (ref 96–112)
Creatinine, Ser: 0.88 mg/dL (ref 0.40–1.20)
GFR: 68.3 mL/min (ref >60.00)
Glucose, Bld: 94 mg/dL (ref 70–99)
Potassium: 4.5 mEq/L (ref 3.5–5.1)
Sodium: 140 mEq/L (ref 135–145)
Total Bilirubin: 0.9 mg/dL (ref 0.2–1.2)
Total Protein: 6.4 g/dL (ref 6.0–8.3)

## 2022-11-02 LAB — CBC WITH DIFFERENTIAL/PLATELET
Basophils Absolute: 0 10*3/uL (ref 0.0–0.1)
Basophils Relative: 0.3 % (ref 0.0–3.0)
Eosinophils Absolute: 0 10*3/uL (ref 0.0–0.7)
Eosinophils Relative: 1 % (ref 0.0–5.0)
HCT: 43.9 % (ref 36.0–46.0)
Hemoglobin: 14.9 g/dL (ref 12.0–15.0)
Lymphocytes Relative: 43.8 % (ref 12.0–46.0)
Lymphs Abs: 1.9 10*3/uL (ref 0.7–4.0)
MCHC: 34.1 g/dL (ref 30.0–36.0)
MCV: 92.7 fl (ref 78.0–100.0)
Monocytes Absolute: 0.4 10*3/uL (ref 0.1–1.0)
Monocytes Relative: 8.7 % (ref 3.0–12.0)
Neutro Abs: 2 10*3/uL (ref 1.4–7.7)
Neutrophils Relative %: 46.2 % (ref 43.0–77.0)
Platelets: 256 10*3/uL (ref 150.0–400.0)
RBC: 4.73 Mil/uL (ref 3.87–5.11)
RDW: 12.4 % (ref 11.5–15.5)
WBC: 4.4 10*3/uL (ref 4.0–10.5)

## 2022-11-02 LAB — LIPID PANEL
Cholesterol: 122 mg/dL (ref 0–200)
HDL: 44.9 mg/dL (ref >39.00)
LDL Cholesterol: 59 mg/dL (ref 0–99)
NonHDL: 76.73
Total CHOL/HDL Ratio: 3
Triglycerides: 87 mg/dL (ref 0.0–149.0)
VLDL: 17.4 mg/dL (ref 0.0–40.0)

## 2022-11-02 LAB — TSH: TSH: 2.46 u[IU]/mL (ref 0.35–5.50)

## 2022-11-02 MED ORDER — ALPRAZOLAM 0.5 MG PO TABS: 0.2500 mg | ORAL_TABLET | Freq: Two times a day (BID) | ORAL | 0 refills | Status: AC | PRN

## 2022-11-02 NOTE — Patient Instructions (Addendum)
Team request bone density from GYN - thanks for also looking for prevnar 20 from [facility]  Please stop by lab before you go If you have mychart- we will send your results within 3 business days of [place] receiving them.  If you do not have mychart- we will call you about results within 5 business days of [place] receiving them.  *please also note that you will see labs on mychart as soon as they post. I will later go in and write notes on them- will say "notes from Dr. Durene Cal"   Recommended follow up: Return in about 1 year (around 11/02/2023) for followup or sooner if needed.Schedule b4 you leave.
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
## 2022-11-07 ENCOUNTER — Ambulatory Visit (INDEPENDENT_AMBULATORY_CARE_PROVIDER_SITE_OTHER): Payer: Medicare Other

## 2022-11-07 VITALS — Wt 128.0 lb

## 2022-11-07 DIAGNOSIS — E2839 Other primary ovarian failure: Secondary | ICD-10-CM | POA: Diagnosis not present

## 2022-11-07 DIAGNOSIS — Z0000 Encounter for general adult medical examination without abnormal findings: Secondary | ICD-10-CM

## 2022-11-07 NOTE — Progress Notes (Addendum)
I connected with  Karen Salinas on 11/07/22 by a audio enabled telemedicine application and verified that I am speaking with the correct person using two identifiers.  Patient Location: Home  Provider Location: Office/Clinic  I discussed the limitations of evaluation and management by telemedicine. The patient expressed understanding and agreed to proceed.   Patient Medicare AWV questionnaire was completed by the patient on 11/06/22; I have confirmed that all information answered by patient is correct and no changes since this date.      Subjective:   Karen Salinas is a 67 y.o. female who presents for an Initial Medicare Annual Wellness Visit.    Review of Systems     Cardiac Risk Factors include: advanced age (>58men, >61 women);dyslipidemia;hypertension     Objective:    Today's Vitals   11/07/22 0840  Weight: 128 lb (58.1 kg)   Body mass index is 23.41 kg/m.     11/07/2022    8:43 AM 06/21/2021   10:06 AM 06/06/2021    5:20 PM 12/16/2012   11:08 AM 12/13/2012   11:19 AM  Advanced Directives  Does Patient Have a Medical Advance Directive? Yes Yes No Patient has advance directive, copy not in chart Patient has advance directive, copy not in chart  Type of Advance Directive Healthcare Power of [place];Living will Healthcare Power of [place];Living will  Healthcare Power of [place];Living will Healthcare Power of [place];Living will  Does patient want to make changes to medical advance directive?  No - Patient declined     Copy of Healthcare Power of Attorney in Chart? No - copy requested No - copy requested  Copy requested from family   Would patient like information on creating a medical advance directive?   No - Patient declined    Pre-existing out of facility DNR order (yellow form or pink MOST form)    No     Current Medications (verified) Outpatient Encounter Medications as of 11/07/2022  Medication Sig   ALPRAZolam (XANAX) 0.5 MG tablet Take 0.5 tablets (0.25 mg  total) by mouth 2 (two) times daily as needed for anxiety (with flights. do not drive for 8 hours after taking).   cholecalciferol (VITAMIN D3) 25 MCG (1000 UNIT) tablet Take 1,000 Units by mouth daily.   estradiol (VIVELLE-DOT) 0.05 MG/24HR patch Place 1 patch onto the skin 2 (two) times a week.   ezetimibe (ZETIA) 10 MG tablet TAKE 1 TABLET(10 MG) BY MOUTH DAILY   losartan (COZAAR) 25 MG tablet TAKE 1 TABLET(25 MG) BY MOUTH TWICE DAILY AT 10 AM AND AT 5 PM   pravastatin (PRAVACHOL) 40 MG tablet Take 1 tablet (40 mg total) by mouth daily.   tretinoin (RETIN-A) 0.05 % cream Apply 1 application topically daily as needed (blemishes).   No facility-administered encounter medications on file as of 11/07/2022.    Allergies (verified) Shellfish allergy   History: Past Medical History:  Diagnosis Date   Hyperlipidemia    on meds   Hypertension    on meds   IBS (irritable bowel syndrome)    years ago- better later in lfe   Kidney stones    hx of   Low back pain    on and off through most of life- compression fracture, prior gymnast   Past Surgical History:  Procedure Laterality Date   BREAST CYST ASPIRATION     COLONOSCOPY  2012   Medoff-MAC-mira(exc)-normal- 10 yr recall   CYSTOSCOPY WITH STENT PLACEMENT Right 12/16/2012   for stone. Procedure: RIGHT  URETERAL STENT PLACEMENT;  Surgeon: Crecencio Mc, MD;  Location: [facility];  Service: Urology;  Laterality: Right;   CYSTOSCOPY/RETROGRADE/URETEROSCOPY Right 12/16/2012   Procedure: CYSTOSCOPY/RETROGRADE/URETEROSCOPY;  Surgeon: Crecencio Mc, MD;  Location: [facility];  Service: Urology;  Laterality: Right;   DILATION AND CURETTAGE OF UTERUS     no heartbeat   INTERCOSTAL NERVE BLOCK Right 06/23/2021   Procedure: INTERCOSTAL NERVE BLOCK;  Surgeon: Loreli Slot, MD;  Location: [facility] OR;  Service: Thoracic;  Laterality: Right;   LASIK     NODE DISSECTION Right 06/23/2021   Procedure: NODE DISSECTION;  Surgeon: Loreli Slot, MD;  Location:  [facility] OR;  Service: Thoracic;  Laterality: Right;   TOTAL VAGINAL HYSTERECTOMY  2002   TUBAL LIGATION     VIDEO BRONCHOSCOPY WITH ENDOBRONCHIAL NAVIGATION N/A 06/23/2021   Procedure: VIDEO BRONCHOSCOPY WITH ENDOBRONCHIAL NAVIGATION;  Surgeon: Loreli Slot, MD;  Location: [facility] OR;  Service: Thoracic;  Laterality: N/A;   XI ROBOTIC ASSISTED THORACOSCOPY- SEGMENTECTOMY Right 06/23/2021   Procedure: XI ROBOTIC ASSISTED THORACOSCOPY-RIGHT LOWER LOBE SUPERIOR SEGMENTECTOMY;  Surgeon: Loreli Slot, MD;  Location: [facility] OR;  Service: Thoracic;  Laterality: Right;   Family History  Problem Relation Age of Onset   Hypertension Mother        age 58 in 2022   Hypertension Father        unknown cause of death MI? 98   Hyperlipidemia Father    Hypertension Sister    Colon polyps Brother 35   Stroke Brother    Prostate cancer Brother    High Cholesterol Brother    Sleep apnea Brother        cpap   Ovarian cancer Maternal Grandmother        age 58   Stroke Paternal Grandmother    Prostate cancer Paternal Grandfather    Colon cancer Neg Hx    Esophageal cancer Neg Hx    Rectal cancer Neg Hx    Stomach cancer Neg Hx    Social History   Socioeconomic History   Marital status: Divorced    Spouse name: Not on file   Number of children: Not on file   Years of education: Not on file   Highest education level: Bachelor's degree (e.g., BA, AB, BS)  Occupational History   Not on file  Tobacco Use   Smoking status: Never   Smokeless tobacco: Never  Vaping Use   Vaping Use: Never used  Substance and Sexual Activity   Alcohol use: Not Currently    Alcohol/week: 0.0 - 3.0 standard drinks of alcohol    Comment: rare   Drug use: No   Sexual activity: Yes    Partners: Male    Comment: monogamous  Other Topics Concern   Not on file  Social History Narrative   Lives with long term boyfriend (Tom) for 12 years in 2022. Son Ronaldo Miyamoto (Dr. Durene Cal patient) and daughter med school [employer].     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      Hobbies: [place] they own, travel, walk and light jog 3-4 days a week and pilates   Social Determinants of Health   Financial Resource Strain: Low Risk  (11/06/2022)   Overall Financial Resource Strain (CARDIA)    Difficulty of Paying Living Expenses: Not hard at all  Food Insecurity: No Food Insecurity (11/06/2022)   Hunger Vital Sign    Worried About Running Out of Food in the  Last Year: Never true    Ran Out of Food in the Last Year: Never true  Transportation Needs: No Transportation Needs (11/06/2022)   PRAPARE - Administrator, Civil Service (Medical): No    Lack of Transportation (Non-Medical): No  Physical Activity: Sufficiently Active (11/06/2022)   Exercise Vital Sign    Days of Exercise per Week: 5 days    Minutes of Exercise per Session: 60 min  Stress: No Stress Concern Present (11/06/2022)   [employer] of Occupational Health - Occupational Stress Questionnaire    Feeling of Stress : Only a little  Social Connections: Moderately Isolated (11/06/2022)   Social Connection and Isolation Panel [NHANES]    Frequency of Communication with Friends and Family: More than three times a week    Frequency of Social Gatherings with Friends and Family: More than three times a week    Attends Religious Services: Never    Database administrator or Organizations: No    Attends Engineer, structural: Never    Marital Status: Living with partner    Tobacco Counseling Counseling given: Not Answered   Clinical Intake:  Pre-visit preparation completed: Yes  Pain : No/denies pain     BMI - recorded: 23.41 Nutritional Status: BMI of 19-24  Normal Nutritional Risks: None Diabetes: No  How often do you need to have someone help you when you read instructions, pamphlets, or other written materials from your doctor or pharmacy?: 1 - Never  Interpreter Needed?: No  Information entered by  :: Lanier Ensign, LPN   Activities of Daily Living    11/06/2022    5:53 PM  In your present state of health, do you have any difficulty performing the following activities:  Hearing? 0  Vision? 0  Difficulty concentrating or making decisions? 0  Walking or climbing stairs? 0  Dressing or bathing? 0  Doing errands, shopping? 0  Preparing Food and eating ? N  Using the Toilet? N  In the past six months, have you accidently leaked urine? N  Do you have problems with loss of bowel control? N  Managing your Medications? N  Managing your Finances? N  Housekeeping or managing your Housekeeping? N    Patient Care Team: Shelva Majestic, MD as PCP - General (Family Medicine) Pricilla Riffle, MD as PCP - Cardiology (Cardiology) Syliva Overman, RN as Oncology Nurse Navigator (Oncology)  Indicate any recent Medical Services you may have received from other than [facility] providers in the past year (date may be approximate).     Assessment:   This is a routine wellness examination for [employer].  Hearing/Vision screen Hearing Screening - Comments:: Pt denies hearing issues  Vision Screening - Comments:: Pt follow up with [facility]   Dietary issues and exercise activities discussed:     Goals Addressed             This Visit's Progress    Patient Stated       None at this time        Depression Screen    11/07/2022    8:42 AM 11/02/2022    8:54 AM 10/31/2021   11:14 AM 03/17/2021    1:28 PM 03/22/2020    9:30 AM 11/08/2018   10:39 AM 06/20/2013    8:00 AM  PHQ 2/9 Scores  PHQ - 2 Score 0 0 0 1 0 0 0  PHQ- 9 Score 0 0 0 1  0  Fall Risk    11/06/2022    5:53 PM 11/02/2022    8:54 AM 10/31/2021   11:16 AM 03/17/2021    1:28 PM 03/22/2020    9:30 AM  Fall Risk   Falls in the past year? 0 0 0 0   Number falls in past yr: 0 0 0 0   Injury with Fall? 0 0  0 0  Risk for fall due to : Impaired vision No Fall Risks     Follow up Falls prevention discussed Falls evaluation  completed  Falls evaluation completed Falls evaluation completed    MEDICARE RISK AT HOME:  Medicare Risk at Home - 11/07/22 0847     Any stairs in or around the home? Yes    If so, are there any without handrails? No    Home free of loose throw rugs in walkways, pet beds, electrical cords, etc? Yes    Adequate lighting in your home to reduce risk of falls? Yes    Life alert? No    Use of a cane, walker or w/c? No    Grab bars in the bathroom? No    Shower chair or bench in shower? No    Elevated toilet seat or a handicapped toilet? No             TIMED UP AND GO:  Was the test performed? No    Cognitive Function:        11/07/2022    8:46 AM  6CIT Screen  What Year? 0 points  What month? 0 points  What time? 0 points  Count back from 20 0 points  Months in reverse 0 points  Repeat phrase 0 points  Total Score 0 points    Immunizations Immunization History  Administered Date(s) Administered   Fluad Quad(high Dose 65+) 02/24/2021   H1N1 05/27/2008   Hepatitis A 11/10/2011   Influenza Split 03/09/2012, 02/20/2014, 03/08/2020   Influenza Whole 05/22/2005, 02/22/2008, 01/27/2009, 01/20/2010   Influenza,inj,Quad PF,6+ Mos 02/09/2017, 02/07/2018, 01/30/2019   Influenza-Unspecified 02/17/2015, 02/21/2016, 02/09/2017, 02/28/2022   Janssen (J&J) SARS-COV-2 Vaccination 07/26/2019   Moderna SARS-COV2 Booster Vaccination 03/23/2020, 09/14/2020, 01/30/2021   PNEUMOCOCCAL CONJUGATE-20 04/04/2022   Pneumococcal Polysaccharide-23 03/09/2005   Td 05/22/2006   Tdap 10/13/2016   Typhoid Parenteral 11/10/2011   Zoster Recombinat (Shingrix) 10/17/2017, 02/15/2018   Zoster, Live 02/21/2016    TDAP status: Up to date  Flu Vaccine status: Up to date  Pneumococcal vaccine status: Up to date  Covid-19 vaccine status: Completed vaccines  Qualifies for Shingles Vaccine? Yes   Zostavax completed Yes   Shingrix Completed?: Yes  Screening Tests Health Maintenance  Topic  Date Due   DEXA SCAN  Never done   COVID-19 Vaccine (2 - Janssen risk series) 11/18/2022 (Originally 02/27/2021)   INFLUENZA VACCINE  12/21/2022   Medicare Annual Wellness (AWV)  11/07/2023   MAMMOGRAM  11/17/2023   DTaP/Tdap/Td (3 - Td or Tdap) 10/14/2026   Colonoscopy  05/31/2031   Pneumonia Vaccine 67+ Years old  Completed   Hepatitis C Screening  Completed   Zoster Vaccines- Shingrix  Completed   HPV VACCINES  Aged Out    Health Maintenance  Health Maintenance Due  Topic Date Due   DEXA SCAN  Never done    Colorectal cancer screening: Type of screening: Colonoscopy. Completed 05/30/21. Repeat every 10 years  Mammogram status: Completed 6/28/232. Repeat every year pt scheduled for 11/20/22 @ [facility] center wants bone scan same day of possible  Bone Density status: Ordered 11/07/22. Pt provided with contact info and advised to call to schedule appt.   Additional Screening:  Hepatitis C Screening:  Completed 10/15/12  Vision Screening: Recommended annual ophthalmology exams for early detection of glaucoma and other disorders of the eye. Is the patient up to date with their annual eye exam?  Yes  Who is the provider or what is the name of the office in which the patient attends annual eye exams? [facility]  If pt is not established with a provider, would they like to be referred to a provider to establish care? No .   Dental Screening: Recommended annual dental exams for proper oral hygiene  Community Resource Referral / Chronic Care Management: CRR required this visit?  No   CCM required this visit?  No     Plan:     I have personally reviewed and noted the following in the patient's chart:   Medical and social history Use of alcohol, tobacco or illicit drugs  Current medications and supplements including opioid prescriptions. Patient is not currently taking opioid prescriptions. Functional ability and status Nutritional status Physical activity Advanced  directives List of other physicians Hospitalizations, surgeries, and ER visits in previous 12 months Vitals Screenings to include cognitive, depression, and falls Referrals and appointments  In addition, I have reviewed and discussed with patient certain preventive protocols, quality metrics, and best practice recommendations. A written personalized care plan for preventive services as well as general preventive health recommendations were provided to patient.     Marzella Schlein, LPN   1/61/0960   After Visit Summary: (MyChart) Due to this being a telephonic visit, the after visit summary with patients personalized plan was offered to patient via MyChart   Nurse Notes: none

## 2022-11-07 NOTE — Patient Instructions (Signed)
[name] , Thank you for taking time to come for your Medicare Wellness Visit. I appreciate your ongoing commitment to your health goals. Please review the following plan we discussed and let me know if I can assist you in the future.   These are the goals we discussed:  Goals      Patient Stated     None at this time         This is a list of the screening recommended for you and due dates:  Health Maintenance  Topic Date Due   DEXA scan (bone density measurement)  Never done   COVID-19 Vaccine (2 - Janssen risk series) 11/18/2022*   Flu Shot  12/21/2022   Medicare Annual Wellness Visit  11/07/2023   Mammogram  11/17/2023   DTaP/Tdap/Td vaccine (3 - Td or Tdap) 10/14/2026   Colon Cancer Screening  05/31/2031   Pneumonia Vaccine  Completed   Hepatitis C Screening  Completed   Zoster (Shingles) Vaccine  Completed   HPV Vaccine  Aged Out  *Topic was postponed. The date shown is not the original due date.    Advanced directives: Please bring a copy of your health care power of attorney and living will to the office at your convenience.  Conditions/risks identified: none at this time   Next appointment: Follow up in one year for your annual wellness visit    Preventive Care 65 Years and Older, Female Preventive care refers to lifestyle choices and visits with your health care provider that can promote health and wellness. What does preventive care include? A yearly physical exam. This is also called an annual well check. Dental exams once or twice a year. Routine eye exams. Ask your health care provider how often you should have your eyes checked. Personal lifestyle choices, including: Daily care of your teeth and gums. Regular physical activity. Eating a healthy diet. Avoiding tobacco and drug use. Limiting alcohol use. Practicing safe sex. Taking low-dose aspirin every day. Taking vitamin and mineral supplements as recommended by your health care provider. What  happens during an annual well check? The services and screenings done by your health care provider during your annual well check will depend on your age, overall health, lifestyle risk factors, and family history of disease. Counseling  Your health care provider may ask you questions about your: Alcohol use. Tobacco use. Drug use. Emotional well-being. Home and relationship well-being. Sexual activity. Eating habits. History of falls. Memory and ability to understand (cognition). Work and work Astronomer. Reproductive health. Screening  You may have the following tests or measurements: Height, weight, and BMI. Blood pressure. Lipid and cholesterol levels. These may be checked every 5 years, or more frequently if you are over 37 years old. Skin check. Lung cancer screening. You may have this screening every year starting at age 51 if you have a 30-pack-year history of smoking and currently smoke or have quit within the past 15 years. Fecal occult blood test (FOBT) of the stool. You may have this test every year starting at age 74. Flexible sigmoidoscopy or colonoscopy. You may have a sigmoidoscopy every 5 years or a colonoscopy every 10 years starting at age 74. Hepatitis C blood test. Hepatitis B blood test. Sexually transmitted disease (STD) testing. Diabetes screening. This is done by checking your blood sugar (glucose) after you have not eaten for a while (fasting). You may have this done every 1-3 years. Bone density scan. This is done to screen for osteoporosis. You may  have this done starting at age 73. Mammogram. This may be done every 1-2 years. Talk to your health care provider about how often you should have regular mammograms. Talk with your health care provider about your test results, treatment options, and if necessary, the need for more tests. Vaccines  Your health care provider may recommend certain vaccines, such as: Influenza vaccine. This is recommended every  year. Tetanus, diphtheria, and acellular pertussis (Tdap, Td) vaccine. You may need a Td booster every 10 years. Zoster vaccine. You may need this after age 33. Pneumococcal 13-valent conjugate (PCV13) vaccine. One dose is recommended after age 56. Pneumococcal polysaccharide (PPSV23) vaccine. One dose is recommended after age 60. Talk to your health care provider about which screenings and vaccines you need and how often you need them. This information is not intended to replace advice given to you by your health care provider. Make sure you discuss any questions you have with your health care provider. Document Released: 06/04/2015 Document Revised: 01/26/2016 Document Reviewed: 03/09/2015 Elsevier Interactive Patient Education  2017 [place] Prevention in the Home Falls can cause injuries. They can happen to people of all ages. There are many things you can do to make your home safe and to help prevent falls. What can I do on the outside of my home? Regularly fix the edges of walkways and driveways and fix any cracks. Remove anything that might make you trip as you walk through a door, such as a raised step or threshold. Trim any bushes or trees on the path to your home. Use bright outdoor lighting. Clear any walking paths of anything that might make someone trip, such as rocks or tools. Regularly check to see if handrails are loose or broken. Make sure that both sides of any steps have handrails. Any raised decks and porches should have guardrails on the edges. Have any leaves, snow, or ice cleared regularly. Use sand or salt on walking paths during winter. Clean up any spills in your garage right away. This includes oil or grease spills. What can I do in the bathroom? Use night lights. Install grab bars by the toilet and in the tub and shower. Do not use towel bars as grab bars. Use non-skid mats or decals in the tub or shower. If you need to sit down in the shower, use a  plastic, non-slip stool. Keep the floor dry. Clean up any water that spills on the floor as soon as it happens. Remove soap buildup in the tub or shower regularly. Attach bath mats securely with double-sided non-slip rug tape. Do not have throw rugs and other things on the floor that can make you trip. What can I do in the bedroom? Use night lights. Make sure that you have a light by your bed that is easy to reach. Do not use any sheets or blankets that are too big for your bed. They should not hang down onto the floor. Have a firm chair that has side arms. You can use this for support while you get dressed. Do not have throw rugs and other things on the floor that can make you trip. What can I do in the kitchen? Clean up any spills right away. Avoid walking on wet floors. Keep items that you use a lot in easy-to-reach places. If you need to reach something above you, use a strong step stool that has a grab bar. Keep electrical cords out of the way. Do not use floor polish  or wax that makes floors slippery. If you must use wax, use non-skid floor wax. Do not have throw rugs and other things on the floor that can make you trip. What can I do with my stairs? Do not leave any items on the stairs. Make sure that there are handrails on both sides of the stairs and use them. Fix handrails that are broken or loose. Make sure that handrails are as long as the stairways. Check any carpeting to make sure that it is firmly attached to the stairs. Fix any carpet that is loose or worn. Avoid having throw rugs at the top or bottom of the stairs. If you do have throw rugs, attach them to the floor with carpet tape. Make sure that you have a light switch at the top of the stairs and the bottom of the stairs. If you do not have them, ask someone to add them for you. What else can I do to help prevent falls? Wear shoes that: Do not have high heels. Have rubber bottoms. Are comfortable and fit you  well. Are closed at the toe. Do not wear sandals. If you use a stepladder: Make sure that it is fully opened. Do not climb a closed stepladder. Make sure that both sides of the stepladder are locked into place. Ask someone to hold it for you, if possible. Clearly mark and make sure that you can see: Any grab bars or handrails. First and last steps. Where the edge of each step is. Use tools that help you move around (mobility aids) if they are needed. These include: Canes. Walkers. Scooters. Crutches. Turn on the lights when you go into a dark area. Replace any light bulbs as soon as they burn out. Set up your furniture so you have a clear path. Avoid moving your furniture around. If any of your floors are uneven, fix them. If there are any pets around you, be aware of where they are. Review your medicines with your doctor. Some medicines can make you feel dizzy. This can increase your chance of falling. Ask your doctor what other things that you can do to help prevent falls. This information is not intended to replace advice given to you by your health care provider. Make sure you discuss any questions you have with your health care provider. Document Released: 03/04/2009 Document Revised: 10/14/2015 Document Reviewed: 06/12/2014 Elsevier Interactive Patient Education  2017 [employer].

## 2022-11-13 ENCOUNTER — Encounter: Payer: Self-pay | Admitting: Internal Medicine

## 2022-11-20 ENCOUNTER — Ambulatory Visit
Admission: RE | Admit: 2022-11-20 | Discharge: 2022-11-20 | Disposition: A | Discharge status: Home / Self Care | Payer: Medicare Other | Source: Ambulatory Visit | Attending: Obstetrics & Gynecology | Admitting: Obstetrics & Gynecology

## 2022-11-20 DIAGNOSIS — Z0000 Encounter for general adult medical examination without abnormal findings: Secondary | ICD-10-CM

## 2022-11-22 ENCOUNTER — Encounter: Payer: Medicare Other | Attending: Psychology | Admitting: Psychology

## 2022-11-22 ENCOUNTER — Encounter: Payer: Self-pay | Admitting: Psychology

## 2022-11-22 DIAGNOSIS — R4189 Other symptoms and signs involving cognitive functions and awareness: Secondary | ICD-10-CM | POA: Diagnosis present

## 2022-11-22 DIAGNOSIS — Z0189 Encounter for other specified special examinations: Secondary | ICD-10-CM | POA: Insufficient documentation

## 2022-11-22 DIAGNOSIS — Z818 Family history of other mental and behavioral disorders: Secondary | ICD-10-CM | POA: Diagnosis not present
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
## 2022-11-29 ENCOUNTER — Telehealth: Payer: Self-pay | Admitting: Internal Medicine

## 2022-11-29 NOTE — Telephone Encounter (Signed)
Patient requested a telephone visit due to them having a work conflict; patient is aware of this upcoming visit change.

## 2022-12-05 ENCOUNTER — Encounter (HOSPITAL_BASED_OUTPATIENT_CLINIC_OR_DEPARTMENT_OTHER): Payer: Medicare Other

## 2022-12-05 DIAGNOSIS — Z0189 Encounter for other specified special examinations: Secondary | ICD-10-CM | POA: Diagnosis not present

## 2022-12-05 DIAGNOSIS — R4189 Other symptoms and signs involving cognitive functions and awareness: Secondary | ICD-10-CM

## 2022-12-06 NOTE — Progress Notes (Signed)
Behavioral Observations:  The patient appeared well-groomed and appropriately dressed. Her manners were polite and appropriate to the situation. The patient's attitude towards testing was positive and her effort was good.  Neuropsychology Note  [name] completed 150 minutes of neuropsychological testing with technician, Staci Acosta, BA, under the supervision of Arley Phenix, PsyD., Clinical Neuropsychologist. The patient did not appear overtly distressed by the testing session, per behavioral observation or via self-report to the technician. Rest breaks were offered.   Clinical Decision Making: In considering the patient's current level of functioning, level of presumed impairment, nature of symptoms, emotional and behavioral responses during clinical interview, level of literacy, and observed level of motivation/effort, a battery of tests was selected by Dr. Kieth Brightly during initial consultation on 11/22/2022. This was communicated to the technician. Communication between the neuropsychologist and technician was ongoing throughout the testing session and changes were made as deemed necessary based on patient performance on testing, technician observations and additional pertinent factors such as those listed above.  Tests Administered: Wechsler Adult Intelligence Scale, 4th Edition (WAIS-IV) Wechsler Memory Scale, 4th Edition (WMS-IV); Adult Battery    Results:  WAIS-IV:  Composite Score Summary  Scale Sum of Scaled Scores Composite Score Percentile Rank 95% Conf. Interval Qualitative Description  Verbal Comprehension 33 VCI 105 63 99-110 Average  Perceptual Reasoning 39 PRI 117 87 110-122 High Average  Working Memory 28 WMI 122 93 114-127 Superior  Processing Speed 24 PSI 111 77 102-118 High Average  Full Scale 124 FSIQ 116 86 112-120 High Average  General Ability 72 GAI 112 79 107-117 High Average   Verbal Comprehension Subtests Summary  Subtest Raw Score Scaled  Score Percentile Rank Reference Group Scaled Score SEM  Similarities 23 9 37 9 1.04  Vocabulary 51 14 91 15 0.67  Information 15 10 50 11 0.73  The scaled scores in the Reference Group Scaled Score column are based on the performance of examinees aged 20:0-34:11 (i.e., the reference group). See Chapter 6 of the WAIS-IV Technical and Interpretive Manual for more information.  Perceptual Reasoning Subtests Summary  Subtest Raw Score Scaled Score Percentile Rank Reference Group Scaled Score SEM  Block Design 42 12 75 9 1.08  Matrix Reasoning 19 13 84 10 0.90  Visual Puzzles 16 14 91 10 0.85   Working Librarian, academic Raw Score Scaled Score Percentile Rank Reference Group Scaled Score SEM  Digit Span 33 14 91 12 0.79  Arithmetic 19 14 91 14 0.99   Processing Speed Subtests Summary  Subtest Raw Score Scaled Score Percentile Rank Reference Group Scaled Score SEM  Symbol Search 28 11 63 8 1.31  Coding 70 13 84 9 0.99    WMS-IV:  Index Score Summary  Index Sum of Scaled Scores Index Score Percentile Rank 95% Confidence Interval Qualitative Descriptor  Auditory Memory (AMI) 43 104 61 98-110 Average  Visual Memory (VMI) 47 110 75 104-115 High Average  Visual Working Memory (VWMI) 28 123 94 114-129 Superior  Immediate Memory (IMI) 44 107 68 100-113 Average  Delayed Memory (DMI) 46 110 75 103-116 High Average   Primary Subtest Scaled Score Summary  Subtest Domain Raw Score Scaled Score Percentile Rank  Logical Memory I AM 31 12 75  Logical Memory II AM 20 10 50  Verbal Paired Associates I AM 30 11 63  Verbal Paired Associates II AM 9 10 50  Designs I VM 54 8 25  Designs II VM 51 11 63  Visual  Reproduction I VM 39 13 84  Visual Reproduction II VM 35 15 95  Spatial Addition VWM 19 16 98  Symbol Span VWM 24 12 75    Auditory Memory Process Score Summary  Process Score Raw Score Scaled Score Percentile Rank Cumulative Percentage (Base Rate)  LM II Recognition 23 - -  26-50%  VPA II Recognition 38 - - 51-75%   Visual Memory Process Score Summary  Process Score Raw Score Scaled Score Percentile Rank Cumulative Percentage (Base Rate)  DE I Content 33 10 50 -  DE I Spatial 11 7 16  -  DE II Content 31 10 50 -  DE II Spatial 10 9 37 -  DE II Recognition 9 - - 3-9%  VR II Recognition 6 - - >75%   ABILITY-MEMORY ANALYSIS  Ability Score:  VCI: 105 Date of Testing:  WAIS-IV; WMS-IV 2022/12/05  Predicted Difference Method   Index Predicted WMS-IV Index Score Actual WMS-IV Index Score Difference Critical Value  Significant Difference Y/N Base Rate  Auditory Memory 103 104 -1 10.15 N   Visual Memory 102 110 -8 9.30 N   Visual Working Memory 103 123 -20 11.68 Y 5-10%  Immediate Memory 103 107 -4 11.15 N   Delayed Memory 103 110 -7 11.49 N   Statistical significance (critical value) at the .01 level.    Feedback to Patient: Karen Salinas will return on 04/26/2023 for an interactive feedback session with Dr. Kieth Brightly at which time her test performances, clinical impressions and treatment recommendations will be reviewed in detail. The patient understands she can contact our office should she require our assistance before this time.  150 minutes spent face-to-face with patient administering standardized tests, 30 minutes spent scoring Radiographer, therapeutic). [CPT [id number], [id number]]  Full report to follow.

## 2022-12-08 ENCOUNTER — Other Ambulatory Visit: Payer: Medicare Other

## 2022-12-20 ENCOUNTER — Telehealth: Payer: Self-pay | Admitting: Internal Medicine

## 2022-12-20 NOTE — Telephone Encounter (Signed)
Called patient regarding upcoming September appointments, patient is notified.  

## 2023-01-03 ENCOUNTER — Encounter: Payer: Self-pay | Admitting: Family

## 2023-01-03 ENCOUNTER — Ambulatory Visit: Payer: Medicare Other | Admitting: Family

## 2023-01-03 VITALS — BP 110/72 | HR 82 | Temp 97.8°F | Wt 129.2 lb

## 2023-01-03 DIAGNOSIS — J309 Allergic rhinitis, unspecified: Secondary | ICD-10-CM | POA: Diagnosis not present

## 2023-01-03 NOTE — Patient Instructions (Signed)
It was very nice to see you today!   I believe your symptoms are allergy related. Look for generic Nasacort or Flonase over the counter Start with 1 spray each nostril twice a day for 3 days, then use daily until all symptoms are improved, then continue using every other day to prevent new symptoms. You can add generic Xyzal or Claritin daily if needed. Drink lots of water! At least 8 cups daily.  Call back if symptoms are not improved after another 2 weeks.      PLEASE NOTE:  If you had any lab tests please let us know if you have not heard back within a few days. You may see your results on MyChart before we have a chance to review them but we will give you a call once they are reviewed by [place]. If we ordered any referrals today, please let us know if you have not heard from their office within the next week.
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
## 2023-01-05 ENCOUNTER — Encounter: Payer: Self-pay | Admitting: Internal Medicine

## 2023-01-15 ENCOUNTER — Encounter: Payer: Self-pay | Admitting: Family Medicine

## 2023-01-16 ENCOUNTER — Other Ambulatory Visit: Payer: Self-pay | Admitting: Medical Genetics

## 2023-01-16 ENCOUNTER — Other Ambulatory Visit: Payer: Self-pay | Admitting: Family Medicine

## 2023-01-16 DIAGNOSIS — E2839 Other primary ovarian failure: Secondary | ICD-10-CM

## 2023-01-16 DIAGNOSIS — Z006 Encounter for examination for normal comparison and control in clinical research program: Secondary | ICD-10-CM

## 2023-01-29 ENCOUNTER — Encounter: Payer: Medicare Other | Admitting: Psychology

## 2023-02-12 ENCOUNTER — Inpatient Hospital Stay: Payer: Medicare Other | Attending: Hematology and Oncology

## 2023-02-12 ENCOUNTER — Ambulatory Visit (HOSPITAL_COMMUNITY)
Admission: RE | Admit: 2023-02-12 | Discharge: 2023-02-12 | Disposition: A | Discharge status: Home / Self Care | Payer: Medicare Other | Source: Ambulatory Visit | Attending: Internal Medicine | Admitting: Internal Medicine

## 2023-02-12 DIAGNOSIS — Z85118 Personal history of other malignant neoplasm of bronchus and lung: Secondary | ICD-10-CM | POA: Insufficient documentation

## 2023-02-12 DIAGNOSIS — C3490 Malignant neoplasm of unspecified part of unspecified bronchus or lung: Secondary | ICD-10-CM | POA: Insufficient documentation

## 2023-02-12 DIAGNOSIS — I7121 Aneurysm of the ascending aorta, without rupture: Secondary | ICD-10-CM | POA: Diagnosis not present

## 2023-02-12 LAB — CBC WITH DIFFERENTIAL (CANCER CENTER ONLY)
Abs Immature Granulocytes: 0.02 10*3/uL (ref 0.00–0.07)
Basophils Absolute: 0 10*3/uL (ref 0.0–0.1)
Basophils Relative: 0 %
Eosinophils Absolute: 0 10*3/uL (ref 0.0–0.5)
Eosinophils Relative: 0 %
HCT: 41.6 % (ref 36.0–46.0)
Hemoglobin: 14.6 g/dL (ref 12.0–15.0)
Immature Granulocytes: 0 %
Lymphocytes Relative: 17 %
Lymphs Abs: 1.1 10*3/uL (ref 0.7–4.0)
MCH: 32.9 pg (ref 26.0–34.0)
MCHC: 35.1 g/dL (ref 30.0–36.0)
MCV: 93.7 fL (ref 80.0–100.0)
Monocytes Absolute: 0.4 10*3/uL (ref 0.1–1.0)
Monocytes Relative: 6 %
Neutro Abs: 4.8 10*3/uL (ref 1.7–7.7)
Neutrophils Relative %: 77 %
Platelet Count: 200 10*3/uL (ref 150–400)
RBC: 4.44 MIL/uL (ref 3.87–5.11)
RDW: 11.9 % (ref 11.5–15.5)
WBC Count: 6.3 10*3/uL (ref 4.0–10.5)
nRBC: 0 % (ref 0.0–0.2)

## 2023-02-12 LAB — CMP (CANCER CENTER ONLY)
ALT: 22 U/L (ref 0–44)
AST: 15 U/L (ref 15–41)
Albumin: 4.2 g/dL (ref 3.5–5.0)
Alkaline Phosphatase: 53 U/L (ref 38–126)
Anion gap: 4 — ABNORMAL LOW (ref 5–15)
BUN: 14 mg/dL (ref 8–23)
CO2: 28 mmol/L (ref 22–32)
Calcium: 9.4 mg/dL (ref 8.9–10.3)
Chloride: 107 mmol/L (ref 98–111)
Creatinine: 0.96 mg/dL (ref 0.44–1.00)
GFR, Estimated: >60 mL/min (ref >60)
Glucose, Bld: 102 mg/dL — ABNORMAL HIGH (ref 70–99)
Potassium: 4.3 mmol/L (ref 3.5–5.1)
Sodium: 139 mmol/L (ref 135–145)
Total Bilirubin: 1.1 mg/dL (ref 0.3–1.2)
Total Protein: 6.5 g/dL (ref 6.5–8.1)

## 2023-02-12 MED ORDER — IOHEXOL 300 MG/ML  SOLN
75.0000 mL | Freq: Once | INTRAMUSCULAR | Status: AC | PRN
  Administered 2023-02-12: 75 mL via INTRAVENOUS

## 2023-02-12 MED ORDER — SODIUM CHLORIDE (PF) 0.9 % IJ SOLN
INTRAMUSCULAR | Status: AC
  Filled 2023-02-12: qty 50

## 2023-02-14 ENCOUNTER — Telehealth: Payer: Medicare Other | Admitting: Internal Medicine

## 2023-02-19 ENCOUNTER — Inpatient Hospital Stay (HOSPITAL_BASED_OUTPATIENT_CLINIC_OR_DEPARTMENT_OTHER): Payer: Medicare Other | Admitting: Internal Medicine

## 2023-02-19 DIAGNOSIS — C3490 Malignant neoplasm of unspecified part of unspecified bronchus or lung: Secondary | ICD-10-CM | POA: Diagnosis not present

## 2023-02-19 NOTE — Progress Notes (Signed)
[facility] Telephone:([phone]   [phone]  PROGRESS NOTE FOR TELEMEDICINE VISITS  Shelva Majestic, MD [address] [place] [address]  I connected withNAME@ on 02/19/23 at  2:00 PM EDT by telephone visit and verified that I am speaking with the correct person using two identifiers.   I discussed the limitations, risks, security and privacy concerns of performing an evaluation and management service by telemedicine and the availability of in-person appointments. I also discussed with the patient that there may be a patient responsible charge related to this service. The patient expressed understanding and agreed to proceed.  Other persons participating in the visit and their role in the encounter:  None  Patient's location: Home Provider's location: [facility] DIAGNOSIS:  Stage IA (T1 a, N0, M0) non-small cell lung cancer, adenocarcinoma presented with right lower lobe groundglass opacity.   PRIOR THERAPY:  status post right lower lobe superior segmentectomy with lymph node dissection under the care of Dr. Dorris Fetch on June 23, 2021.   CURRENT THERAPY: Observation.  INTERVAL HISTORY: Karen Salinas 67 y.o. female has a telephone virtual visit with me today for evaluation and discussion of her recent scan results.  The patient is feeling fine today with no concerning complaints.  She denied having any current chest pain, shortness of breath, cough or hemoptysis.  She has no nausea, vomiting, diarrhea or constipation.  She has no recent weight loss or night sweats.  She had repeat CT scan of the chest performed recently and she is here for evaluation and discussion of her scan results.  MEDICAL HISTORY: Past Medical History:  Diagnosis Date   Hyperlipidemia    on meds   Hypertension    on meds   IBS (irritable bowel syndrome)    years ago- better later in lfe   Kidney stones    hx of   Low back pain    on and off through  most of life- compression fracture, prior gymnast    ALLERGIES:  is allergic to shellfish allergy.  MEDICATIONS:  Current Outpatient Medications  Medication Sig Dispense Refill   ALPRAZolam (XANAX) 0.5 MG tablet Take 0.5 tablets (0.25 mg total) by mouth 2 (two) times daily as needed for anxiety (with flights. do not drive for 8 hours after taking). 20 tablet 0   cholecalciferol (VITAMIN D3) 25 MCG (1000 UNIT) tablet Take 1,000 Units by mouth daily.     clobetasol ointment (TEMOVATE) 0.05 % Apply topically.     estradiol (VIVELLE-DOT) 0.05 MG/24HR patch Place 1 patch onto the skin 2 (two) times a week.     ezetimibe (ZETIA) 10 MG tablet TAKE 1 TABLET(10 MG) BY MOUTH DAILY 90 tablet 3   losartan (COZAAR) 25 MG tablet TAKE 1 TABLET(25 MG) BY MOUTH TWICE DAILY AT 10 AM AND AT 5 PM 180 tablet 3   pravastatin (PRAVACHOL) 40 MG tablet Take 1 tablet (40 mg total) by mouth daily. 90 tablet 3   tretinoin (RETIN-A) 0.05 % cream Apply 1 application topically daily as needed (blemishes).     No current facility-administered medications for this visit.    SURGICAL HISTORY:  Past Surgical History:  Procedure Laterality Date   BREAST CYST ASPIRATION     COLONOSCOPY  2012   Medoff-MAC-mira(exc)-normal- 10 yr recall   CYSTOSCOPY WITH STENT PLACEMENT Right 12/16/2012   for stone. Procedure: RIGHT URETERAL STENT PLACEMENT;  Surgeon: Crecencio Mc, MD;  Location: [facility];  Service: Urology;  Laterality:  Right;   CYSTOSCOPY/RETROGRADE/URETEROSCOPY Right 12/16/2012   Procedure: CYSTOSCOPY/RETROGRADE/URETEROSCOPY;  Surgeon: Crecencio Mc, MD;  Location: [facility];  Service: Urology;  Laterality: Right;   DILATION AND CURETTAGE OF UTERUS     no heartbeat   INTERCOSTAL NERVE BLOCK Right 06/23/2021   Procedure: INTERCOSTAL NERVE BLOCK;  Surgeon: Loreli Slot, MD;  Location[facility] OR;  Service: Thoracic;  Laterality: Right;   LASIK     NODE DISSECTION Right 06/23/2021   Procedure: NODE DISSECTION;  Surgeon:  Loreli Slot, MD;  Location: [facility] OR;  Service: Thoracic;  Laterality: Right;   TOTAL VAGINAL HYSTERECTOMY  2002   TUBAL LIGATION     VIDEO BRONCHOSCOPY WITH ENDOBRONCHIAL NAVIGATION N/A 06/23/2021   Procedure: VIDEO BRONCHOSCOPY WITH ENDOBRONCHIAL NAVIGATION;  Surgeon: Loreli Slot, MD;  Location: [facility] OR;  Service: Thoracic;  Laterality: N/A;   XI ROBOTIC ASSISTED THORACOSCOPY- SEGMENTECTOMY Right 06/23/2021   Procedure: XI ROBOTIC ASSISTED THORACOSCOPY-RIGHT LOWER LOBE SUPERIOR SEGMENTECTOMY;  Surgeon: Loreli Slot, MD;  Location: [facility] OR;  Service: Thoracic;  Laterality: Right;    REVIEW OF SYSTEMS:  A comprehensive review of systems was negative.     LABORATORY DATA: Lab Results  Component Value Date   WBC 6.3 02/12/2023   HGB 14.6 02/12/2023   HCT 41.6 02/12/2023   MCV 93.7 02/12/2023   PLT 200 02/12/2023      Chemistry      Component Value Date/Time   NA 139 02/12/2023 1001   NA 143 05/08/2022 0731   K 4.3 02/12/2023 1001   CL 107 02/12/2023 1001   CO2 28 02/12/2023 1001   BUN 14 02/12/2023 1001   BUN 18 05/08/2022 0731   CREATININE 0.96 02/12/2023 1001   CREATININE 0.96 03/22/2020 1013      Component Value Date/Time   CALCIUM 9.4 02/12/2023 1001   ALKPHOS 53 02/12/2023 1001   AST 15 02/12/2023 1001   ALT 22 02/12/2023 1001   BILITOT 1.1 02/12/2023 1001       RADIOGRAPHIC STUDIES: CT Chest W Contrast  Result Date: 02/19/2023 CLINICAL DATA:  Non-small-cell lung cancer. Restaging. * Tracking Code: BO * EXAM: CT CHEST WITH CONTRAST TECHNIQUE: Multidetector CT imaging of the chest was performed during intravenous contrast administration. RADIATION DOSE REDUCTION: This exam was performed according to the departmental dose-optimization program which includes automated exposure control, adjustment of the mA and/or kV according to patient size and/or use of iterative reconstruction technique. CONTRAST:  75mL OMNIPAQUE IOHEXOL 300 MG/ML  SOLN  COMPARISON:  08/03/2022 FINDINGS: Cardiovascular: The heart size is normal. No substantial pericardial effusion. Ascending thoracic aorta measures 3.9 cm diameter. Mediastinum/Nodes: No mediastinal lymphadenopathy. There is no hilar lymphadenopathy. The esophagus has normal imaging features. There is no axillary lymphadenopathy. Lungs/Pleura: Architectural distortion and scarring is again seen in the right lower lobe, stable. No new suspicious pulmonary nodule or mass. No focal airspace consolidation. No pleural effusion. Upper Abdomen: Visualized portion of the upper abdomen is unremarkable. Musculoskeletal: No worrisome lytic or sclerotic osseous abnormality. IMPRESSION: Stable exam. No new or progressive findings to suggest recurrent or metastatic disease. 3.9 cm diameter ascending thoracic aorta. Attention on follow-up recommended. Electronically Signed   By: Kennith Center M.D.   On: 02/19/2023 09:40    ASSESSMENT AND PLAN: This is a very pleasant 67 years old white female with stage Ia (T1 a, N0, M0) non-small cell lung cancer, adenocarcinoma presented with right lower lobe groundglass opacity status post right lower lobe superior segmentectomy with lymph node dissection  under the care of Dr. Dorris Fetch on June 23, 2021 and she has been on observation since that time. The patient had repeat CT scan of the chest performed recently.  I personally and independently reviewed the scan and discussed the result with the patient today. Her scan showed no concerning findings for disease recurrence or metastasis. I recommended for her to continue on observation with repeat CT scan of the chest in 6 months. Regarding the ascending thoracic aortic aneurysm, the patient is followed by Dr. Tenny Craw and her aneurysm is 3.9 cm in diameter. The patient was advised to call immediately if she has any other concerning symptoms in the interval. I discussed the assessment and treatment plan with the patient. The patient was  provided an opportunity to ask questions and all were answered. The patient agreed with the plan and demonstrated an understanding of the instructions.   The patient was advised to call back or seek an in-person evaluation if the symptoms worsen or if the condition fails to improve as anticipated.  I provided 14 minutes of non face-to-face telephone visit time during this encounter, and > 50% was spent counseling as documented under my assessment & plan.  Lajuana Matte, MD 02/19/2023 1:55 PM  Disclaimer: This note was dictated with voice recognition software. Similar sounding words can inadvertently be transcribed and may not be corrected upon review.

## 2023-03-07 ENCOUNTER — Other Ambulatory Visit: Payer: Self-pay | Admitting: Internal Medicine

## 2023-03-07 DIAGNOSIS — E785 Hyperlipidemia, unspecified: Secondary | ICD-10-CM

## 2023-04-02 ENCOUNTER — Other Ambulatory Visit (HOSPITAL_COMMUNITY)
Admission: RE | Admit: 2023-04-02 | Discharge: 2023-04-02 | Disposition: A | Discharge status: Home / Self Care | Payer: Medicare Other | Source: Ambulatory Visit | Attending: Oncology | Admitting: Oncology

## 2023-04-02 DIAGNOSIS — Z006 Encounter for examination for normal comparison and control in clinical research program: Secondary | ICD-10-CM | POA: Insufficient documentation

## 2023-04-09 ENCOUNTER — Other Ambulatory Visit: Payer: Self-pay | Admitting: Obstetrics & Gynecology

## 2023-04-09 DIAGNOSIS — Z1231 Encounter for screening mammogram for malignant neoplasm of breast: Secondary | ICD-10-CM

## 2023-04-10 ENCOUNTER — Encounter: Payer: Medicare Other | Attending: Psychology | Admitting: Psychology

## 2023-04-10 DIAGNOSIS — R413 Other amnesia: Secondary | ICD-10-CM | POA: Diagnosis not present

## 2023-04-10 DIAGNOSIS — R4189 Other symptoms and signs involving cognitive functions and awareness: Secondary | ICD-10-CM | POA: Diagnosis present

## 2023-04-10 DIAGNOSIS — Z820 Family history of epilepsy and other diseases of the nervous system: Secondary | ICD-10-CM | POA: Diagnosis not present

## 2023-04-10 LAB — HELIX MOLECULAR SCREEN: Genetic Analysis Overall Interpretation: NEGATIVE

## 2023-04-10 LAB — GENECONNECT MOLECULAR SCREEN
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
## 2023-04-26 ENCOUNTER — Encounter: Payer: Medicare Other | Attending: Psychology | Admitting: Psychology

## 2023-04-26 DIAGNOSIS — Z820 Family history of epilepsy and other diseases of the nervous system: Secondary | ICD-10-CM | POA: Insufficient documentation

## 2023-04-26 DIAGNOSIS — R4189 Other symptoms and signs involving cognitive functions and awareness: Secondary | ICD-10-CM | POA: Insufficient documentation

## 2023-04-26 DIAGNOSIS — Z85118 Personal history of other malignant neoplasm of bronchus and lung: Secondary | ICD-10-CM | POA: Diagnosis not present

## 2023-05-17 ENCOUNTER — Encounter: Payer: Self-pay | Admitting: Psychology
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
## 2023-06-06 ENCOUNTER — Other Ambulatory Visit: Payer: Self-pay | Admitting: *Deleted

## 2023-06-06 DIAGNOSIS — E785 Hyperlipidemia, unspecified: Secondary | ICD-10-CM

## 2023-06-06 DIAGNOSIS — Z79899 Other long term (current) drug therapy: Secondary | ICD-10-CM

## 2023-06-06 DIAGNOSIS — I10 Essential (primary) hypertension: Secondary | ICD-10-CM

## 2023-06-06 DIAGNOSIS — Z1321 Encounter for screening for nutritional disorder: Secondary | ICD-10-CM

## 2023-06-07 LAB — HEPATIC FUNCTION PANEL
ALT: 18 IU/L (ref 0–32)
AST: 17 IU/L (ref 0–40)
Albumin: 4.5 g/dL (ref 3.9–4.9)
Alkaline Phosphatase: 72 IU/L (ref 44–121)
Bilirubin Total: 0.5 mg/dL (ref 0.0–1.2)
Bilirubin, Direct: 0.16 mg/dL (ref 0.00–0.40)
Total Protein: 6.5 g/dL (ref 6.0–8.5)

## 2023-06-07 LAB — NMR, LIPOPROFILE
Cholesterol, Total: 128 mg/dL (ref 100–199)
HDL Particle Number: 37.5 umol/L (ref >=30.5)
HDL-C: 49 mg/dL (ref >39)
LDL Particle Number: 737 nmol/L (ref <1000)
LDL Size: 20 nm — ABNORMAL LOW (ref >20.5)
LDL-C (NIH Calc): 65 mg/dL (ref 0–99)
LP-IR Score: 36 (ref <=45)
Small LDL Particle Number: 353 nmol/L (ref <=527)
Triglycerides: 65 mg/dL (ref 0–149)

## 2023-06-09 ENCOUNTER — Encounter: Payer: Self-pay | Admitting: Internal Medicine

## 2023-06-09 DIAGNOSIS — E785 Hyperlipidemia, unspecified: Secondary | ICD-10-CM

## 2023-06-11 NOTE — Progress Notes (Unsigned)
Cardiology Office Note   Date:  06/12/2023   ID:  Karen Salinas [birth year], MRN [id number]  PCP:  Shelva Majestic, MD  Cardiologist:   Dietrich Pates, MD   Patient presents for F/U of hyperlipidemia    History of Present Illness: Karen Salinas is a 68 y.o. female with a history of HL, HTN, adeno CA of lung (s/p RLL segmentectomy in 2023), minimally dilated aorta. I saw her back in 2018    Calcium score in 2018 was 0   CT of abdomen in past had minimal calcium   THe pt was last in cardiology in June 2022 (seen by Brunetta Genera NP).  I saw the pt in Jan 2024  Since seen she has done well   Breathing is good  No CP   No dizziness      BP readings from home continue to be good   110s/    Having problems with bursitis in L hip  Questions if OK to take Celebrex   Current Meds  Medication Sig   ALPRAZolam (XANAX) 0.5 MG tablet Take 0.5 tablets (0.25 mg total) by mouth 2 (two) times daily as needed for anxiety (with flights. do not drive for 8 hours after taking).   cholecalciferol (VITAMIN D3) 25 MCG (1000 UNIT) tablet Take 1,000 Units by mouth daily.   clobetasol ointment (TEMOVATE) 0.05 % Apply topically.   estradiol (VIVELLE-DOT) 0.05 MG/24HR patch Place 1 patch onto the skin 2 (two) times a week.   ezetimibe (ZETIA) 10 MG tablet TAKE 1 TABLET(10 MG) BY MOUTH DAILY   losartan (COZAAR) 25 MG tablet TAKE 1 AND 1/2 TABLETS(37.5 MG) BY MOUTH DAILY   pravastatin (PRAVACHOL) 40 MG tablet TAKE 1 TABLET(40 MG) BY MOUTH DAILY   tretinoin (RETIN-A) 0.05 % cream Apply 1 application topically daily as needed (blemishes).     Allergies:   Shellfish allergy   Past Medical History:  Diagnosis Date   Hyperlipidemia    on meds   Hypertension    on meds   IBS (irritable bowel syndrome)    years ago- better later in lfe   Kidney stones    hx of   Low back pain    on and off through most of life- compression fracture, prior gymnast    Past Surgical History:  Procedure Laterality Date    BREAST CYST ASPIRATION     COLONOSCOPY  2012   Medoff-MAC-mira(exc)-normal- 10 yr recall   CYSTOSCOPY WITH STENT PLACEMENT Right 12/16/2012   for stone. Procedure: RIGHT URETERAL STENT PLACEMENT;  Surgeon: Crecencio Mc, MD;  Location: [facility];  Service: Urology;  Laterality: Right;   CYSTOSCOPY/RETROGRADE/URETEROSCOPY Right 12/16/2012   Procedure: CYSTOSCOPY/RETROGRADE/URETEROSCOPY;  Surgeon: Crecencio Mc, MD;  Location: [facility];  Service: Urology;  Laterality: Right;   DILATION AND CURETTAGE OF UTERUS     no heartbeat   INTERCOSTAL NERVE BLOCK Right 06/23/2021   Procedure: INTERCOSTAL NERVE BLOCK;  Surgeon: Loreli Slot, MD;  Location: [facility] OR;  Service: Thoracic;  Laterality: Right;   LASIK     NODE DISSECTION Right 06/23/2021   Procedure: NODE DISSECTION;  Surgeon: Loreli Slot, MD;  Location: [facility] OR;  Service: Thoracic;  Laterality: Right;   TOTAL VAGINAL HYSTERECTOMY  2002   TUBAL LIGATION     VIDEO BRONCHOSCOPY WITH ENDOBRONCHIAL NAVIGATION N/A 06/23/2021   Procedure: VIDEO BRONCHOSCOPY WITH ENDOBRONCHIAL NAVIGATION;  Surgeon: Loreli Slot, MD;  Location: [facility] OR;  Service: Thoracic;  Laterality: N/A;  XI ROBOTIC ASSISTED THORACOSCOPY- SEGMENTECTOMY Right 06/23/2021   Procedure: XI ROBOTIC ASSISTED THORACOSCOPY-RIGHT LOWER LOBE SUPERIOR SEGMENTECTOMY;  Surgeon: Loreli Slot, MD;  Location: [facility] OR;  Service: Thoracic;  Laterality: Right;     Social History:  The patient  reports that she has never smoked. She has never used smokeless tobacco. She reports that she does not currently use alcohol. She reports that she does not use drugs.   Family History:  The patient's family history includes Colon polyps (age of onset: 67) in her brother; High Cholesterol in her brother; Hyperlipidemia in her father; Hypertension in her father, mother, and sister; Ovarian cancer in her maternal grandmother; Prostate cancer in her brother and paternal grandfather; Sleep apnea in her  brother; Stroke in her brother and paternal grandmother.    ROS:  Please see the history of present illness. All other systems are reviewed and  Negative to the above problem except as noted.    PHYSICAL EXAM: VS:  BP 130/80   Pulse 75   Ht 5\' 2"  (1.575 m)   Wt 131 lb (59.4 kg)   SpO2 97%   BMI 23.96 kg/m   GEN: Well nourished, well developed, in no acute distress  HEENT: normal  Neck: JVP is normal  No carotid bruits Cardiac: RRR; no murmur  NO LE  edema  Respiratory:  clear to auscultation GI: soft, nontender,No hepatomegaly    EKG:  EKG is ordered today.  SR 75 bpm    Nonspecific ST changes   Lipid Panel    Component Value Date/Time   CHOL 122 11/02/2022 0934   CHOL 137 10/20/2021 0741   TRIG 87.0 11/02/2022 0934   HDL 44.90 11/02/2022 0934   HDL 55 10/20/2021 0741   CHOLHDL 3 11/02/2022 0934   VLDL 17.4 11/02/2022 0934   LDLCALC 59 11/02/2022 0934   LDLCALC 69 10/20/2021 0741   LDLCALC 135 (H) 03/22/2020 1013   LDLDIRECT 155.9 06/12/2013 0837      Wt Readings from Last 3 Encounters:  06/12/23 131 lb (59.4 kg)  01/03/23 129 lb 3.2 oz (58.6 kg)  11/07/22 128 lb (58.1 kg)      ASSESSMENT AND PLAN:  1  Dyslipidemia  Jan 2025 LDL 65  HDL 49  Trig 65  Particle number is good    Keep on same regimen      2  HTN  BP is contrlled  At home BP in 110s to 120s   continue  meds   3  Aortic dilation  Follw up CT  for lung will image aorta as well    4   Lung CA  pt s/p resection   Follows with Ardelle Anton    Will get labs in 6  and 12 months     Follow up in clinc in 12 months   Sooner with problems   Current medicines are reviewed at length with the patient today.  The patient does not have concerns regarding medicines.  Signed, Dietrich Pates, MD  06/12/2023 10:00 AM    [facility] [address] [place], [place], [place]  [address] Phone: [phone]; Fax: [phone]

## 2023-06-12 ENCOUNTER — Ambulatory Visit: Payer: Medicare Other | Attending: Internal Medicine | Admitting: Internal Medicine

## 2023-06-12 VITALS — BP 130/80 | HR 75 | Ht 62.0 in | Wt 131.0 lb

## 2023-06-12 DIAGNOSIS — R7309 Other abnormal glucose: Secondary | ICD-10-CM | POA: Insufficient documentation

## 2023-06-12 DIAGNOSIS — E785 Hyperlipidemia, unspecified: Secondary | ICD-10-CM | POA: Insufficient documentation

## 2023-06-12 DIAGNOSIS — R5383 Other fatigue: Secondary | ICD-10-CM | POA: Insufficient documentation

## 2023-06-12 DIAGNOSIS — E559 Vitamin D deficiency, unspecified: Secondary | ICD-10-CM | POA: Insufficient documentation

## 2023-06-12 DIAGNOSIS — Z09 Encounter for follow-up examination after completed treatment for conditions other than malignant neoplasm: Secondary | ICD-10-CM | POA: Insufficient documentation

## 2023-06-12 DIAGNOSIS — Z1321 Encounter for screening for nutritional disorder: Secondary | ICD-10-CM | POA: Insufficient documentation

## 2023-06-12 DIAGNOSIS — I10 Essential (primary) hypertension: Secondary | ICD-10-CM | POA: Diagnosis present

## 2023-06-12 NOTE — Addendum Note (Signed)
Addended by: Neita Goodnight B on: 06/12/2023 01:50 PM   Modules accepted: Orders

## 2023-06-12 NOTE — Patient Instructions (Signed)
Medication Instructions:  none *If you need a refill on your cardiac medications before your next appointment, please call your pharmacy*   Lab Work: Labs in 6 months   If you have labs (blood work) drawn today and your tests are completely normal, you will receive your results only by: MyChart Message (if you have MyChart) OR A paper copy in the mail If you have any lab test that is abnormal or we need to change your treatment, we will call you to review the results.   Testing/Procedures: none   Follow-Up: At [facility], you and your health needs are our priority.  As part of our continuing mission to provide you with exceptional heart care, we have created designated Provider Care Teams.  These Care Teams include your primary Cardiologist (physician) and Advanced Practice Providers (APPs -  Physician Assistants and Nurse Practitioners) who all work together to provide you with the care you need, when you need it.  We recommend signing up for the patient portal called "MyChart".  Sign up information is provided on this After Visit Summary.  MyChart is used to connect with patients for Virtual Visits (Telemedicine).  Patients are able to view lab/test results, encounter notes, upcoming appointments, etc.  Non-urgent messages can be sent to your provider as well.   To learn more about what you can do with MyChart, go to [email or web address].    Your next appointment:   1 year(s)  Provider:   Dietrich Pates, MD

## 2023-06-18 ENCOUNTER — Ambulatory Visit: Payer: Self-pay | Admitting: Family Medicine

## 2023-06-18 ENCOUNTER — Ambulatory Visit (INDEPENDENT_AMBULATORY_CARE_PROVIDER_SITE_OTHER): Payer: Medicare Other | Admitting: Family Medicine

## 2023-06-18 ENCOUNTER — Encounter: Payer: Self-pay | Admitting: Family Medicine

## 2023-06-18 VITALS — BP 126/68 | HR 77 | Temp 97.3°F | Ht 62.0 in | Wt 137.0 lb

## 2023-06-18 DIAGNOSIS — I700 Atherosclerosis of aorta: Secondary | ICD-10-CM | POA: Diagnosis not present

## 2023-06-18 DIAGNOSIS — I10 Essential (primary) hypertension: Secondary | ICD-10-CM

## 2023-06-18 DIAGNOSIS — F4323 Adjustment disorder with mixed anxiety and depressed mood: Secondary | ICD-10-CM | POA: Diagnosis not present

## 2023-06-18 DIAGNOSIS — E785 Hyperlipidemia, unspecified: Secondary | ICD-10-CM | POA: Diagnosis not present

## 2023-06-18 MED ORDER — EZETIMIBE 10 MG PO TABS
ORAL_TABLET | ORAL | 0 refills | Status: DC
Start: 2023-06-18 — End: 2023-09-11

## 2023-06-18 MED ORDER — PRAVASTATIN SODIUM 40 MG PO TABS: 40.0000 mg | ORAL_TABLET | Freq: Every day | ORAL | 3 refills | Status: DC

## 2023-06-18 MED ORDER — FLUOXETINE HCL 10 MG PO CAPS: 10.0000 mg | ORAL_CAPSULE | Freq: Every day | ORAL | 3 refills | Status: DC

## 2023-06-18 NOTE — Progress Notes (Signed)
Phone [phone] In person visit   Subjective:   Karen Salinas is a 68 y.o. year old very pleasant female patient who presents for/with See problem oriented charting Chief Complaint  Patient presents with   Anxiety    Pt c/o increased anxiety that is getting worse.   Past Medical History-  Patient Active Problem List   Diagnosis Date Noted   Primary adenocarcinoma of lower lobe of right lung (HCC) 08/05/2021    Priority: High   Ascending aortic aneurysm (HCC) 03/17/2021    Priority: High   Aortic atherosclerosis (HCC) 10/31/2021    Priority: Medium    Essential hypertension 03/16/2015    Priority: Medium    Hyperlipidemia 06/04/2007    Priority: Medium    S/P robot-assisted surgical procedure 07/12/2021    Priority: Low   History of total hysterectomy 03/17/2021    Priority: Low    Medications- reviewed and updated Current Outpatient Medications  Medication Sig Dispense Refill   ALPRAZolam (XANAX) 0.5 MG tablet Take 0.5 tablets (0.25 mg total) by mouth 2 (two) times daily as needed for anxiety (with flights. do not drive for 8 hours after taking). 20 tablet 0   cholecalciferol (VITAMIN D3) 25 MCG (1000 UNIT) tablet Take 1,000 Units by mouth daily.     clobetasol ointment (TEMOVATE) 0.05 % Apply topically.     estradiol (VIVELLE-DOT) 0.05 MG/24HR patch Place 1 patch onto the skin 2 (two) times a week.     ezetimibe (ZETIA) 10 MG tablet TAKE 1 TABLET(10 MG) BY MOUTH DAILY 90 tablet 0   FLUoxetine (PROZAC) 10 MG capsule Take 1 capsule (10 mg total) by mouth daily. 90 capsule 3   losartan (COZAAR) 25 MG tablet TAKE 1 AND 1/2 TABLETS(37.5 MG) BY MOUTH DAILY 135 tablet 0   pravastatin (PRAVACHOL) 40 MG tablet Take 1 tablet (40 mg total) by mouth daily. 90 tablet 3   tretinoin (RETIN-A) 0.05 % cream Apply 1 application topically daily as needed (blemishes).     No current facility-administered medications for this visit.     Objective:  BP 126/68   Pulse 77   Temp (!)  97.3 F (36.3 C)   Ht 5\' 2"  (1.575 m)   Wt 137 lb (62.1 kg)   SpO2 98%   BMI 25.06 kg/m  Gen: NAD, resting comfortably     Assessment and Plan    # Anxiety/depressed mood S:Medication: None Counseling: None at present History: Patient reports history of late related anxiety with need for as needed alprazolam 0.5 mg.  She has noted anxiety increasing over time now.  TSH was normal June 2024.  -she reports long term issues with anxiety- started out particularly after having kids and has been using low dose alprazolam for flights. Alprazolam only sparingly used and outside of flying maybe used twice during pandemic.  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 some hoarse voice - some Pepcid for heartburn with the stress -bursitis in hip that doesn't seem to get fixed - shots and physical therapy but keeps coming back -has a lot of travel -has chatted with daughter- working on step 2 and planning for neurology -has done therapy in  past -anxiety leads to feeling down at times  -some meditation apps mildly helpful -4 panic attacks in life over last 8 years A/P:  General Anxiety Disorder - 7 question screening survey previously listed as 0 and now up to 6. PHQ9 up to 2 up from 0.  Patient with anxiety which she feels is leading to depressed mood at times who is more interested in medication and therapy at present.  I think there is a large element of situational anxiety with caregiver role and recent cancer/health related issues - She would like to trial Prozac/fluoxetine after going over potential options-weight gain was her primary concern in regards to side effects - We did discuss that  it could raise blood pressure some but blood pressure has been well-controlled and she will monitor closely  #hypertension S: medication: losartan 25 mg twice daily  Home readings #s: 110/60 at home BP Readings from Last 3 Encounters:  06/18/23 126/68  06/12/23 130/80  01/03/23 110/72  A/P: Controlled. Continue current medications.  #hyperlipidemia with elevated LP(a) # Aortic atherosclerosis S: Medication: Pravastatin 40 mg daily, Zetia 10 mg daily Lab Results  Component Value Date   CHOL 122 11/02/2022   HDL 44.90 11/02/2022   LDLCALC 59 11/02/2022   LDLDIRECT 155.9 06/12/2013   TRIG 87.0 11/02/2022   CHOLHDL 3 11/02/2022   A/P: Lipids very well-controlled on most recent check Aortic atherosclerosis (presumed stable)- LDL goal ideally <70 and she is at goal-continue current medication - We discussed with elevated LP(a) that there may be other medication options in the future but she is currently maximized  #Sinus pressure- seen back in August and recommended to take  allergy medicine at the time- was doing better then stopped and then got a cold over the holidays and seemed to come back. Tylenol sinus tried today- not clearly helping yet. Zyrtec helpful previously- not taking now -retrial zyrtec for next month to see if symptoms clear- if not please let me know and we can concsider next steps including possible imaging -With prior lung cancer she would like to consider neuroimaging if symptoms do not resolve and I am certainly willing to reconsider that at 6-week follow-up or sooner if symptoms worsen  Recommended follow up: Return in about 6 weeks (around 07/30/2023) for followup or sooner if needed.Schedule b4 you leave. Future Appointments  Date Time Provider Department Center  07/30/2023 11:00 AM Shelva Majestic, MD [facility]  08/13/2023 10:00 AM [facility] None  08/20/2023  3:15 PM Si Gaul, MD [facility] None  11/13/2023  8:15 AM [facility] ANNUAL  WELLNESS VISIT 1 [facility]  12/03/2023  8:20 AM [facility] MM 2 [facility]    Lab/Order associations:   ICD-10-CM   1. Situational mixed anxiety and depressive disorder  F43.23     2. Essential hypertension  I10     3. Hyperlipidemia, unspecified hyperlipidemia type  E78.5     4. Aortic atherosclerosis (HCC)  I70.0       Meds ordered this encounter  Medications   FLUoxetine (PROZAC) 10 MG capsule    Sig: Take 1 capsule (10 mg total) by mouth daily.    Dispense:  90 capsule    Refill:  3    Return precautions advised.  Tana Conch, MD

## 2023-06-18 NOTE — Patient Instructions (Addendum)
Taking the medicine Prozac/fluoxetine as directed and not missing any doses is one of the best things you can do to treat your anxiety and resulting depressed mood.  Here are some things to keep in mind:  Side effects (stomach upset, some increased anxiety) may happen before you notice a benefit.  These side effects typically go away over time. Changes to your dose of medicine or a change in medication all together is sometimes necessary Most people need to be on medication at least 6-12 months Many people will notice an improvement within two weeks but the full effect of the medication can take up to 6 weeks Stopping the medication when you start feeling better often results in a return of symptoms If you start having thoughts of hurting yourself or others after starting this medicine, call our office immediately at [phone] or seek care through 911.    Recommended follow up: Return in about 6 weeks (around 07/30/2023) for followup or sooner if needed.Schedule b4 you leave.

## 2023-06-18 NOTE — Telephone Encounter (Signed)
Pt has been scheduled.

## 2023-06-18 NOTE — Telephone Encounter (Signed)
Chief Complaint: anxiety Symptoms: worsening anxiety Frequency: 6 months Pertinent Negatives: Patient denies self-harm, harm to others Disposition: [] ED /[] Urgent Care (no appt availability in office) / [x] Appointment(In office/virtual)/ []  [facility]/ [] Home Care/ [] Refused Recommended Disposition /[] [facility]/ []  Follow-up with PCP Additional Notes: Patient reports worsening anxiety over the past 6 months. 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 Has also had panic attacks in the past. Scheduled patient per protocol on 06/18/2023. Patient verbalized understanding and to call back with worsening symptoms.    Copied from CRM [id number]. Topic: Clinical - Red Word Triage >> Jun 18, 2023  9:21 AM Theodis Sato wrote: Patient states she is having anxiety and depression and is seeking an appointment for medication to help her alleviate the symptoms. Reason for Disposition  MODERATE anxiety (e.g., persistent or frequent anxiety symptoms; interferes with sleep, school, or work)  Answer Assessment - Initial Assessment Questions 1. CONCERN: "Did anything happen that prompted you to call today?"      Had cancer 2 years ago Mom has Alzheimer - is a caregiver Has chronic pain 2. ANXIETY SYMPTOMS: "Can you describe how you (your loved one; patient) have been feeling?" (e.g., tense, restless, panicky, anxious, keyed up, overwhelmed, sense of impending doom).      Anxious, stress, nerves 3. ONSET: "How long have you been feeling this way?" (e.g., hours, days, weeks)     Worsened over the past 6 months 4. SEVERITY: "How would you rate the level of anxiety?" (e.g., 0 - 10; or mild, moderate, severe).     5/10 - unusual 5. FUNCTIONAL IMPAIRMENT: "How have these feelings affected your ability to do daily activities?" "Have you had more difficulty than usual doing your normal daily activities?" (e.g., getting better, same, worse; self-care, school,  work, interactions)     worsening 6. HISTORY: "Have you felt this way before?" "Have you ever been diagnosed with an anxiety problem in the past?" (e.g., generalized anxiety disorder, panic attacks, PTSD). If Yes, ask: "How was this problem treated?" (e.g., medicines, counseling, etc.)     Reports panic attacks - 3-4 over the years 7. RISK OF HARM - SUICIDAL IDEATION: "Do you ever have thoughts of hurting or killing yourself?" If Yes, ask:  "Do you have these feelings now?" "Do you have a plan on how you would do this?"     no 8. TREATMENT:  "What has been done so far to treat this anxiety?" (e.g., medicines, relaxation strategies). "What has helped?"     Xanax - does not want again, previously used for flying fear 9. TREATMENT - THERAPIST: "Do you have a counselor or therapist? Name?"     no 10. POTENTIAL TRIGGERS: "Do you drink caffeinated beverages (e.g., coffee, colas, teas), and how much daily?" "Do you drink alcohol or use any drugs?" "Have you started any new medicines recently?"       No Recently dx of autoimmune disease 4 mos ago - unsure of relation but did use steriods for tx 11. PATIENT SUPPORT: "Who is with you now?" "Who do you live with?" "Do you have family or friends who you can talk to?"        Live with partner 61. OTHER SYMPTOMS: "Do you have any other symptoms?" (e.g., feeling depressed, trouble concentrating, trouble sleeping, trouble breathing, palpitations or fast heartbeat, chest pain, sweating, nausea, or diarrhea)       Heart burn during underlying anxiety  Protocols used: Anxiety  and Panic Attack-A-AH

## 2023-07-25 ENCOUNTER — Other Ambulatory Visit: Payer: Medicare Other

## 2023-07-30 ENCOUNTER — Encounter: Payer: Self-pay | Admitting: Family Medicine

## 2023-07-30 ENCOUNTER — Ambulatory Visit (INDEPENDENT_AMBULATORY_CARE_PROVIDER_SITE_OTHER): Payer: Medicare Other | Admitting: Family Medicine

## 2023-07-30 VITALS — BP 110/74 | HR 81 | Temp 97.2°F | Ht 62.0 in | Wt 129.6 lb

## 2023-07-30 DIAGNOSIS — F4323 Adjustment disorder with mixed anxiety and depressed mood: Secondary | ICD-10-CM | POA: Insufficient documentation

## 2023-07-30 DIAGNOSIS — I10 Essential (primary) hypertension: Secondary | ICD-10-CM | POA: Diagnosis not present

## 2023-07-30 DIAGNOSIS — Z789 Other specified health status: Secondary | ICD-10-CM

## 2023-07-30 NOTE — Progress Notes (Signed)
 Phone [phone] In person visit   Subjective:   Karen Salinas is a 68 y.o. year old very pleasant female patient who presents for/with See problem oriented charting Chief Complaint  Patient presents with   situational anxiety    Pt here for 6 week f/u.     Past Medical History-  Patient Active Problem List   Diagnosis Date Noted   Primary adenocarcinoma of lower lobe of right lung (HCC) 08/05/2021    Priority: High   Ascending aortic aneurysm (HCC) 03/17/2021    Priority: High   Situational mixed anxiety and depressive disorder 07/30/2023    Priority: Medium    Aortic atherosclerosis (HCC) 10/31/2021    Priority: Medium    Essential hypertension 03/16/2015    Priority: Medium    Hyperlipidemia 06/04/2007    Priority: Medium    S/P robot-assisted surgical procedure 07/12/2021    Priority: Low   History of total hysterectomy 03/17/2021    Priority: Low    Medications- reviewed and updated Current Outpatient Medications  Medication Sig Dispense Refill   ALPRAZolam (XANAX) 0.5 MG tablet Take 0.5 tablets (0.25 mg total) by mouth 2 (two) times daily as needed for anxiety (with flights. do not drive for 8 hours after taking). 20 tablet 0   cholecalciferol (VITAMIN D3) 25 MCG (1000 UNIT) tablet Take 1,000 Units by mouth daily.     clobetasol ointment (TEMOVATE) 0.05 % Apply topically.     estradiol (VIVELLE-DOT) 0.05 MG/24HR patch Place 1 patch onto the skin 2 (two) times a week.     ezetimibe (ZETIA) 10 MG tablet TAKE 1 TABLET(10 MG) BY MOUTH DAILY 90 tablet 0   FLUoxetine (PROZAC) 10 MG capsule Take 1 capsule (10 mg total) by mouth daily. 90 capsule 3   losartan (COZAAR) 25 MG tablet TAKE 1 AND 1/2 TABLETS(37.5 MG) BY MOUTH DAILY 135 tablet 0   pravastatin (PRAVACHOL) 40 MG tablet Take 1 tablet (40 mg total) by mouth daily. 90 tablet 3   tretinoin (RETIN-A) 0.05 % cream Apply 1 application topically daily as needed (blemishes).     No current facility-administered  medications for this visit.     Objective:  BP 110/74   Pulse 81   Temp (!) 97.2 F (36.2 C)   Ht 5\' 2"  (1.575 m)   Wt 129 lb 9.6 oz (58.8 kg)   SpO2 97%   BMI 23.70 kg/m  Gen: NAD, resting comfortably CV: RRR no murmurs rubs or gallops Lungs: CTAB no crackles, wheeze, rhonchi Ext: no edema Skin: warm, dry     Assessment and Plan   # Anxiety/depressed mood-situational element with caregiver burden plus history of cancer in 2023 S: Medication:Fluoxetine 10 mg -Flight related anxiety- as needed alprazolam- Dr. Jennette Kettle used to fill- we fill now as needed -Reports long-term issues with anxieties since having children and then seem to worsen after cancer removal in 2023 -Caregiver burden with mother -Anxiety can lead her to feeling down  -Has done therapy in the past.  Meditation apps also somewhat helpful  Today reports: she has felt really well with the medicine - much improved- has felt more "chill" -lost 8 lbs on medicine- we choose fluoxetine as more weight neutral- reports clothes related. Actually lost 2.5 lbs on home scales only A/P: phq9 of 1 and General Anxiety Disorder - 7 question screening survey of 0- full remission of anxiety and resulting depressed mood- continue current medications   with no side effects   #hypertension S: medication: losartan  25 mg twice daily  Home readings #s: good control at home as well BP Readings from Last 3 Encounters:  07/30/23 110/74  06/18/23 126/68  06/12/23 130/80   A/P: well controlled continue current medications - thankful no effect from fluoxetine  #Health maintenance- upcoming 2 year scan for prior lung cancer- she is optimistic as am I  -measles status unknown- not sure how many vaccines she had and not born prior to 1957 so will update MMR today- knows would need vaccine(s) at pharmacy on medicare  Recommended follow up: 4-6 month follow up  Future Appointments  Date Time Provider Department Center  08/13/2023 10:00 AM  [facility] None  08/13/2023 10:30 AM [facility] 2 [facility] [place]  08/20/2023  3:15 PM Si Gaul, MD [facility] None  11/13/2023  8:15 AM [facility] ANNUAL WELLNESS VISIT 1 [facility] PEC  12/03/2023  8:20 AM [facility] MM 2 [facility]    Lab/Order associations:   ICD-10-CM   1. Situational mixed anxiety and depressive disorder  F43.23     2. Essential hypertension  I10     3. Measles, mumps, rubella (MMR) vaccination status unknown  Z78.9 Measles/Mumps/Rubella Immunity      No orders of the defined types were placed in this encounter.   Return precautions advised.  Tana Conch, MD

## 2023-07-30 NOTE — Patient Instructions (Addendum)
 Please stop by lab before you go If you have mychart- we will send your results within 3 business days of [place] receiving them.  If you do not have mychart- we will call you about results within 5 business days of [place] receiving them.  *please also note that you will see labs on mychart as soon as they post. I will later go in and write notes on them- will say "notes from Dr. Durene Cal"   Thrilled you are doing better  Recommended follow up: 4-6 month follow up

## 2023-07-30 NOTE — Assessment & Plan Note (Signed)
#   Anxiety/depressed mood-situational element with caregiver burden plus history of cancer in 2023 S: Medication:Fluoxetine 10 mg -Flight related anxiety- as needed alprazolam- Dr. Jennette Kettle used to fill- we fill now as needed -Reports long-term issues with anxieties since having children and then seem to worsen after cancer removal in 2023 -Caregiver burden with mother -Anxiety can lead her to feeling down  -Has done therapy in the past.  Meditation apps also somewhat helpful  Today reports: she has felt really well with the medicine - much improved- has felt more "chill" -lost 8 lbs on medicine- we choose fluoxetine as more weight neutral- reports clothes related. Actually lost 2.5 lbs on home scales only A/P: phq9 of 1 and General Anxiety Disorder - 7 question screening survey of 0- full remission of anxiety and resulting depressed mood- continue current medications   with no side effects

## 2023-07-31 LAB — MEASLES/MUMPS/RUBELLA IMMUNITY
Mumps IgG: >300 [AU]/ml
Rubella: 19.1 {index}
Rubeola IgG: 21.6 [AU]/ml

## 2023-07-31 LAB — HM DEXA SCAN

## 2023-08-01 ENCOUNTER — Encounter: Payer: Self-pay | Admitting: Family Medicine

## 2023-08-06 NOTE — Progress Notes (Unsigned)
   Rubin Payor, PhD, LAT, ATC acting as a scribe for Clementeen Graham, MD.  AVIELA BLUNDELL is a 68 y.o. female who presents to [facility] at [facility] today for osteoporosis management. Family hx of cancer.  DEXA scan (date, T-score): 07/31/23: Spine= -1.0, L-FN= -2.5, R-FN= -2.3 Prior treatment: *** History of Hip, Spine, or Wrist Fx: no Heart disease or stroke: *** Cancer: yes- lung Kidney Disease: *** Gastric/Peptic Ulcer: *** Gastric bypass surgery: *** Severe GERD: *** Hx of seizures: no Age at Menopause: *** Calcium intake: *** Vitamin D intake: yes- 2000iU Hormone replacement therapy: estradiol patch Smoking history: never smoked Alcohol: yes, moderate Exercise: yes, 5-7x/wk Major dental work in past year: *** Parents with hip/spine fracture: *** Height loss: yes, slight: 62"-61.8"   Pertinent review of systems: ***  Relevant historical information: ***   Exam:  There were no vitals taken for this visit. General: Well Developed, well nourished, and in no acute distress.   MSK: ***    Lab and Radiology Results No results found for this or any previous visit (from the past 72 hours). No results found.     Assessment and Plan: 68 y.o. female with ***   PDMP not reviewed this encounter. No orders of the defined types were placed in this encounter.  No orders of the defined types were placed in this encounter.    Discussed warning signs or symptoms. Please see discharge instructions. Patient expresses understanding.   ***

## 2023-08-07 ENCOUNTER — Ambulatory Visit (INDEPENDENT_AMBULATORY_CARE_PROVIDER_SITE_OTHER): Admitting: Family Medicine

## 2023-08-07 ENCOUNTER — Telehealth: Payer: Self-pay

## 2023-08-07 VITALS — BP 122/84 | HR 88 | Ht 62.0 in | Wt 130.0 lb

## 2023-08-07 DIAGNOSIS — Z8781 Personal history of (healed) traumatic fracture: Secondary | ICD-10-CM | POA: Insufficient documentation

## 2023-08-07 DIAGNOSIS — Z85118 Personal history of other malignant neoplasm of bronchus and lung: Secondary | ICD-10-CM | POA: Diagnosis not present

## 2023-08-07 DIAGNOSIS — M810 Age-related osteoporosis without current pathological fracture: Secondary | ICD-10-CM | POA: Insufficient documentation

## 2023-08-07 NOTE — Addendum Note (Signed)
 Addended by: Dierdre Searles on: 08/07/2023 12:00 PM   Modules accepted: Orders

## 2023-08-07 NOTE — Telephone Encounter (Signed)
 Check coverage for Evenity, if not covered, proceed with Tymlos.

## 2023-08-07 NOTE — Telephone Encounter (Signed)
 Order placed for Evenity at [facility].

## 2023-08-07 NOTE — Telephone Encounter (Signed)
-----   Message from Adron Bene sent at 08/07/2023  9:06 AM EDT ----- Regarding: OP Please check for auth of Tymlos and [employer]

## 2023-08-07 NOTE — Patient Instructions (Addendum)
 Thank you for coming in today.   Check out Karen Salinas  We will check for authorization of Evenity and Tymlos

## 2023-08-07 NOTE — Telephone Encounter (Signed)
 Auth Submission: NO AUTH NEEDED Site of care: Site of care: [facility] Payer: Medicare A/B with AARP supplement Medication & CPT/J Code(s) submitted: Evenity (Romosozumab) [id number] Route of submission (phone, fax, portal):  Phone # Fax # Auth type: Buy/Bill PB Units/visits requested: 210mg  x 12 doses Reference number:  Approval from: 08/07/23 to 06/21/24

## 2023-08-08 ENCOUNTER — Encounter: Payer: Self-pay | Admitting: Family Medicine

## 2023-08-08 ENCOUNTER — Encounter: Payer: Self-pay | Admitting: Internal Medicine

## 2023-08-08 NOTE — Telephone Encounter (Signed)
 Forwarding to Dr. Denyse Amass to review and advise.

## 2023-08-13 ENCOUNTER — Ambulatory Visit (HOSPITAL_COMMUNITY)
Admission: RE | Admit: 2023-08-13 | Discharge: 2023-08-13 | Disposition: A | Discharge status: Home / Self Care | Payer: Medicare Other | Source: Ambulatory Visit | Attending: Internal Medicine | Admitting: Internal Medicine

## 2023-08-13 ENCOUNTER — Inpatient Hospital Stay: Payer: Medicare Other | Attending: Internal Medicine

## 2023-08-13 DIAGNOSIS — C3431 Malignant neoplasm of lower lobe, right bronchus or lung: Secondary | ICD-10-CM | POA: Insufficient documentation

## 2023-08-13 DIAGNOSIS — C3490 Malignant neoplasm of unspecified part of unspecified bronchus or lung: Secondary | ICD-10-CM | POA: Insufficient documentation

## 2023-08-13 LAB — CMP (CANCER CENTER ONLY)
ALT: 19 U/L (ref 0–44)
AST: 17 U/L (ref 15–41)
Albumin: 4.4 g/dL (ref 3.5–5.0)
Alkaline Phosphatase: 46 U/L (ref 38–126)
Anion gap: 5 (ref 5–15)
BUN: 14 mg/dL (ref 8–23)
CO2: 28 mmol/L (ref 22–32)
Calcium: 9.2 mg/dL (ref 8.9–10.3)
Chloride: 107 mmol/L (ref 98–111)
Creatinine: 0.93 mg/dL (ref 0.44–1.00)
GFR, Estimated: >60 mL/min (ref >60)
Glucose, Bld: 99 mg/dL (ref 70–99)
Potassium: 4.3 mmol/L (ref 3.5–5.1)
Sodium: 140 mmol/L (ref 135–145)
Total Bilirubin: 0.9 mg/dL (ref 0.0–1.2)
Total Protein: 6.6 g/dL (ref 6.5–8.1)

## 2023-08-13 LAB — CBC WITH DIFFERENTIAL (CANCER CENTER ONLY)
Abs Immature Granulocytes: 0.01 10*3/uL (ref 0.00–0.07)
Basophils Absolute: 0 10*3/uL (ref 0.0–0.1)
Basophils Relative: 1 %
Eosinophils Absolute: 0 10*3/uL (ref 0.0–0.5)
Eosinophils Relative: 0 %
HCT: 42.8 % (ref 36.0–46.0)
Hemoglobin: 15.1 g/dL — ABNORMAL HIGH (ref 12.0–15.0)
Immature Granulocytes: 0 %
Lymphocytes Relative: 40 %
Lymphs Abs: 1.9 10*3/uL (ref 0.7–4.0)
MCH: 32.9 pg (ref 26.0–34.0)
MCHC: 35.3 g/dL (ref 30.0–36.0)
MCV: 93.2 fL (ref 80.0–100.0)
Monocytes Absolute: 0.4 10*3/uL (ref 0.1–1.0)
Monocytes Relative: 8 %
Neutro Abs: 2.5 10*3/uL (ref 1.7–7.7)
Neutrophils Relative %: 51 %
Platelet Count: 248 10*3/uL (ref 150–400)
RBC: 4.59 MIL/uL (ref 3.87–5.11)
RDW: 12.2 % (ref 11.5–15.5)
WBC Count: 4.9 10*3/uL (ref 4.0–10.5)
nRBC: 0 % (ref 0.0–0.2)

## 2023-08-13 MED ORDER — IOHEXOL 300 MG/ML  SOLN
75.0000 mL | Freq: Once | INTRAMUSCULAR | Status: AC | PRN
  Administered 2023-08-13: 75 mL via INTRAVENOUS

## 2023-08-14 ENCOUNTER — Telehealth: Payer: Self-pay

## 2023-08-14 NOTE — Telephone Encounter (Addendum)
 Hello,   Auth Submission: NO AUTH NEEDED Site of care: Site of care: [facility] Payer: medicare a.b, aarp supp Medication & CPT/J Code(s) submitted: Evenity (Romosozumab) [id number] Route of submission (phone, fax, portal): portal Phone # Fax # Auth type: buy/bill hb Units/visits requested: 210mg , q4 weeks x12 doses Reference number:  Approval from: 08/14/23 to 05/21/24

## 2023-08-14 NOTE — Telephone Encounter (Signed)
 Karen Salinas, [id number] minutes ago (9:56 AM)    Hello,    Auth Submission: NO AUTH NEEDED Site of care: Site of care: [facility] Payer: medicare a.b, aarp supp Medication & CPT/J Code(s) submitted: Evenity (Romosozumab) [id number] Route of submission (phone, fax, portal): portal Phone # Fax # Auth type: buy/bill hb Units/visits requested: 210mg , 12 doses Reference number:  Approval from: 08/14/23 to 05/21/24

## 2023-08-15 ENCOUNTER — Other Ambulatory Visit (HOSPITAL_COMMUNITY): Payer: Self-pay | Admitting: Family Medicine

## 2023-08-15 DIAGNOSIS — M810 Age-related osteoporosis without current pathological fracture: Secondary | ICD-10-CM

## 2023-08-17 NOTE — Telephone Encounter (Signed)
 Pt holding on Evenity until she hears back from Dr. Tenny Craw (cardiology).

## 2023-08-18 ENCOUNTER — Encounter: Payer: Self-pay | Admitting: Internal Medicine

## 2023-08-20 ENCOUNTER — Inpatient Hospital Stay (HOSPITAL_BASED_OUTPATIENT_CLINIC_OR_DEPARTMENT_OTHER): Payer: Medicare Other | Admitting: Internal Medicine

## 2023-08-20 VITALS — BP 129/82 | HR 77 | Temp 97.7°F | Resp 17 | Ht 62.0 in | Wt 127.5 lb

## 2023-08-20 DIAGNOSIS — C3490 Malignant neoplasm of unspecified part of unspecified bronchus or lung: Secondary | ICD-10-CM

## 2023-08-20 DIAGNOSIS — C3431 Malignant neoplasm of lower lobe, right bronchus or lung: Secondary | ICD-10-CM | POA: Diagnosis not present

## 2023-08-20 NOTE — Progress Notes (Signed)
 [facility] [phone]   [phone]  OFFICE PROGRESS NOTE  Shelva Majestic, MD [address] [place] [address]  DIAGNOSIS:  Stage IA (T1 a, N0, M0) non-small cell lung cancer, adenocarcinoma presented with right lower lobe groundglass opacity.  PRIOR THERAPY:  status post right lower lobe superior segmentectomy with lymph node dissection under the care of Dr. Dorris Fetch on June 23, 2021.  CURRENT THERAPY: Observation.  INTERVAL HISTORY: Karen Salinas 68 y.o. female returns to the clinic today for 6 months follow-up visit.Discussed the use of AI scribe software for clinical note transcription with the patient, who gave verbal consent to proceed.  History of Present Illness   Karen Salinas is a 68 year old female with stage 1A non-small cell lung cancer, adenocarcinoma, who presents for routine follow-up.  She has a history of stage 1A non-small cell lung cancer, adenocarcinoma, and underwent a right lower lobe superior segmentectomy with lymph node dissection on June 23, 2021. She has been on observation since that time. A recent scan showed stable post-surgical changes in the right lower lobe with no evidence of local recurrence or metastatic disease in the chest. No new complaints or chest pain.  She started taking Prozac recently due to the stress of caring for her 43 year old mother who has Alzheimer's disease. She no longer takes Xanax and describes the Prozac dose as 'just a little dose.'  She occasionally experiences headaches, which she attributes to sinus issues, describing them as 'just right in front.' These headaches come and go and do not persist for long periods.  She always feels something in the area where she had surgery but does not express concern about it.       MEDICAL HISTORY: Past Medical History:  Diagnosis Date   Hyperlipidemia    on meds   Hypertension    on meds   IBS (irritable bowel syndrome)     years ago- better later in lfe   Kidney stones    hx of   Low back pain    on and off through most of life- compression fracture, prior gymnast    ALLERGIES:  is allergic to shellfish allergy.  MEDICATIONS:  Current Outpatient Medications  Medication Sig Dispense Refill   ALPRAZolam (XANAX) 0.5 MG tablet Take 0.5 tablets (0.25 mg total) by mouth 2 (two) times daily as needed for anxiety (with flights. do not drive for 8 hours after taking). 20 tablet 0   cholecalciferol (VITAMIN D3) 25 MCG (1000 UNIT) tablet Take 1,000 Units by mouth daily.     clobetasol ointment (TEMOVATE) 0.05 % Apply topically.     estradiol (VIVELLE-DOT) 0.05 MG/24HR patch Place 1 patch onto the skin 2 (two) times a week.     ezetimibe (ZETIA) 10 MG tablet TAKE 1 TABLET(10 MG) BY MOUTH DAILY 90 tablet 0   FLUoxetine (PROZAC) 10 MG capsule Take 1 capsule (10 mg total) by mouth daily. 90 capsule 3   losartan (COZAAR) 25 MG tablet TAKE 1 AND 1/2 TABLETS(37.5 MG) BY MOUTH DAILY 135 tablet 0   pravastatin (PRAVACHOL) 40 MG tablet Take 1 tablet (40 mg total) by mouth daily. 90 tablet 3   tretinoin (RETIN-A) 0.05 % cream Apply 1 application topically daily as needed (blemishes).     No current facility-administered medications for this visit.    SURGICAL HISTORY:  Past Surgical History:  Procedure Laterality Date   BREAST CYST ASPIRATION  COLONOSCOPY  2012   Medoff-MAC-mira(exc)-normal- 10 yr recall   CYSTOSCOPY WITH STENT PLACEMENT Right 12/16/2012   for stone. Procedure: RIGHT URETERAL STENT PLACEMENT;  Surgeon: Crecencio Mc, MD;  Location: [facility];  Service: Urology;  Laterality: Right;   CYSTOSCOPY/RETROGRADE/URETEROSCOPY Right 12/16/2012   Procedure: CYSTOSCOPY/RETROGRADE/URETEROSCOPY;  Surgeon: Crecencio Mc, MD;  Location: [facility];  Service: Urology;  Laterality: Right;   DILATION AND CURETTAGE OF UTERUS     no heartbeat   INTERCOSTAL NERVE BLOCK Right 06/23/2021   Procedure: INTERCOSTAL NERVE BLOCK;   Surgeon: Loreli Slot, MD;  Location: [facility] OR;  Service: Thoracic;  Laterality: Right;   LASIK     NODE DISSECTION Right 06/23/2021   Procedure: NODE DISSECTION;  Surgeon: Loreli Slot, MD;  Location: [facility] OR;  Service: Thoracic;  Laterality: Right;   TOTAL VAGINAL HYSTERECTOMY  2002   TUBAL LIGATION     VIDEO BRONCHOSCOPY WITH ENDOBRONCHIAL NAVIGATION N/A 06/23/2021   Procedure: VIDEO BRONCHOSCOPY WITH ENDOBRONCHIAL NAVIGATION;  Surgeon: Loreli Slot, MD;  Location: [facility];  Service: Thoracic;  Laterality: N/A;   XI ROBOTIC ASSISTED THORACOSCOPY- SEGMENTECTOMY Right 06/23/2021   Procedure: XI ROBOTIC ASSISTED THORACOSCOPY-RIGHT LOWER LOBE SUPERIOR SEGMENTECTOMY;  Surgeon: Loreli Slot, MD;  Location: [facility] OR;  Service: Thoracic;  Laterality: Right;    REVIEW OF SYSTEMS:  A comprehensive review of systems was negative.   PHYSICAL EXAMINATION: General appearance: alert, cooperative, and no distress Head: Normocephalic, without obvious abnormality, atraumatic Neck: no adenopathy, no JVD, supple, symmetrical, trachea midline, and thyroid not enlarged, symmetric, no tenderness/mass/nodules Lymph nodes: Cervical, supraclavicular, and axillary nodes normal. Resp: clear to auscultation bilaterally Back: symmetric, no curvature. ROM normal. No CVA tenderness. Cardio: regular rate and rhythm, S1, S2 normal, no murmur, click, rub or gallop GI: soft, non-tender; bowel sounds normal; no masses,  no organomegaly Extremities: extremities normal, atraumatic, no cyanosis or edema  ECOG PERFORMANCE STATUS: 1 - Symptomatic but completely ambulatory  Blood pressure 129/82, pulse 77, temperature 97.7 F (36.5 C), temperature source Temporal, resp. rate 17, height 5\' 2"  (1.575 m), weight 127 lb 8 oz (57.8 kg), SpO2 99%.  LABORATORY DATA: Lab Results  Component Value Date   WBC 4.9 08/13/2023   HGB 15.1 (H) 08/13/2023   HCT 42.8 08/13/2023   MCV 93.2 08/13/2023   PLT 248  08/13/2023      Chemistry      Component Value Date/Time   NA 140 08/13/2023 0945   NA 143 05/08/2022 0731   K 4.3 08/13/2023 0945   CL 107 08/13/2023 0945   CO2 28 08/13/2023 0945   BUN 14 08/13/2023 0945   BUN 18 05/08/2022 0731   CREATININE 0.93 08/13/2023 0945   CREATININE 0.96 03/22/2020 1013      Component Value Date/Time   CALCIUM 9.2 08/13/2023 0945   ALKPHOS 46 08/13/2023 0945   AST 17 08/13/2023 0945   ALT 19 08/13/2023 0945   BILITOT 0.9 08/13/2023 0945       RADIOGRAPHIC STUDIES: CT Chest W Contrast Result Date: 08/17/2023 CLINICAL DATA:  Non-small lung cancer follow-up. * Tracking Code: BO * EXAM: CT CHEST WITH CONTRAST TECHNIQUE: Multidetector CT imaging of the chest was performed during intravenous contrast administration. RADIATION DOSE REDUCTION: This exam was performed according to the departmental dose-optimization program which includes automated exposure control, adjustment of the mA and/or kV according to patient size and/or use of iterative reconstruction technique. CONTRAST:  75mL OMNIPAQUE IOHEXOL 300 MG/ML  SOLN COMPARISON:  02/12/2023 FINDINGS: Cardiovascular:  No significant vascular findings. Normal heart size. No pericardial effusion. Ascending thoracic aorta within normal limits in diameter. Mediastinum/Nodes: No axillary or supraclavicular adenopathy. No mediastinal or hilar adenopathy. No pericardial fluid. Esophagus normal. Lungs/Pleura: Postsurgical change in the RIGHT lower lobe. No nodularity along the surgical margin. No new or suspicious nodules in LEFT or RIGHT lungs. Mild pleural thickening along the fissures is stable. Upper Abdomen: Limited view of the liver, kidneys, pancreas are unremarkable. Normal adrenal glands. Musculoskeletal: No aggressive osseous lesion. Hemangioma in the T12 vertebral body. IMPRESSION: 1. Stable postsurgical change in the RIGHT lower lobe. No evidence of local recurrence. 2. No evidence of metastatic adenopathy in the  chest. 3. No new or suspicious pulmonary nodules. Electronically Signed   By: Genevive Bi M.D.   On: 08/17/2023 09:19    ASSESSMENT AND PLAN: This is a very pleasant 68 years old white female with stage IA (T1 a, N0, M0) non-small cell lung cancer, adenocarcinoma presented with right lower lobe groundglass opacities status post right lower lobe superior segmentectomy with lymph node dissection under the care of Dr. Dorris Fetch on June 23, 2021. The patient has been on observation since that time and she is feeling fine.    Stage 1A non-small cell lung cancer, adenocarcinoma 68 year old female, status post right lower lobe superior segmentectomy with lymph node dissection on June 23, 2021. Recent imaging shows post-surgical changes in the right lower lobe with no evidence of local recurrence or metastatic disease. Clinically well with no new cancer-related symptoms. The risk of recurrence is small but necessitates continued surveillance. Plan to transition to annual CT scans after two years post-surgery to reduce radiation exposure and anxiety. Surveillance will continue for five years post-surgery for early detection of recurrence. - Transition to annual CT scans every March for surveillance. - Schedule the scan a week to ten days before the follow-up appointment for timely results.  Headaches Intermittent frontal headaches, likely sinus-related, not persistent, suggesting no significant concern currently. - Evaluate if headaches persist and become more frequent or severe.   She was advised to call immediately if she has any concerning symptoms in the interval. The patient voices understanding of current disease status and treatment options and is in agreement with the current care plan.  All questions were answered. The patient knows to call the clinic with any problems, questions or concerns. We can certainly see the patient much sooner if necessary.  The total time spent in the  appointment was 20 minutes.  Disclaimer: This note was dictated with voice recognition software. Similar sounding words can inadvertently be transcribed and may not be corrected upon review.

## 2023-08-20 NOTE — Telephone Encounter (Signed)
 Forwarding to Dr. Denyse Amass to review and advise.

## 2023-08-21 ENCOUNTER — Inpatient Hospital Stay (HOSPITAL_COMMUNITY): Admission: RE | Admit: 2023-08-21 | Source: Ambulatory Visit

## 2023-08-22 NOTE — Telephone Encounter (Signed)
 Forwarding to Dr. Denyse Amass to review and advise.

## 2023-08-31 NOTE — Telephone Encounter (Signed)
 Reviewed with radiology   ON their measurement was 39 mm    Overall normal    Spoke to patient about review

## 2023-09-11 ENCOUNTER — Other Ambulatory Visit: Payer: Self-pay | Admitting: Internal Medicine

## 2023-09-11 DIAGNOSIS — E785 Hyperlipidemia, unspecified: Secondary | ICD-10-CM

## 2023-11-24 ENCOUNTER — Other Ambulatory Visit: Payer: Self-pay | Admitting: Internal Medicine

## 2023-12-03 ENCOUNTER — Ambulatory Visit: Payer: Medicare Other

## 2023-12-07 ENCOUNTER — Ambulatory Visit

## 2023-12-10 ENCOUNTER — Encounter: Payer: Self-pay | Admitting: Family Medicine

## 2023-12-10 ENCOUNTER — Other Ambulatory Visit: Payer: Self-pay

## 2023-12-10 ENCOUNTER — Other Ambulatory Visit (HOSPITAL_COMMUNITY): Payer: Self-pay

## 2023-12-10 DIAGNOSIS — E785 Hyperlipidemia, unspecified: Secondary | ICD-10-CM

## 2023-12-10 MED ORDER — EZETIMIBE 10 MG PO TABS
ORAL_TABLET | ORAL | 1 refills | Status: DC
  Filled 2023-12-10: qty 90, fill #0

## 2023-12-10 MED ORDER — EZETIMIBE 10 MG PO TABS: ORAL_TABLET | ORAL | 1 refills | Status: AC

## 2023-12-13 ENCOUNTER — Ambulatory Visit
Admission: RE | Admit: 2023-12-13 | Discharge: 2023-12-13 | Disposition: A | Discharge status: Home / Self Care | Source: Ambulatory Visit | Attending: Obstetrics & Gynecology | Admitting: Obstetrics & Gynecology

## 2023-12-13 DIAGNOSIS — Z1231 Encounter for screening mammogram for malignant neoplasm of breast: Secondary | ICD-10-CM

## 2024-01-01 ENCOUNTER — Ambulatory Visit: Admitting: Family Medicine

## 2024-01-01 ENCOUNTER — Ambulatory Visit (INDEPENDENT_AMBULATORY_CARE_PROVIDER_SITE_OTHER): Admitting: Family Medicine

## 2024-01-01 ENCOUNTER — Encounter: Payer: Self-pay | Admitting: Family Medicine

## 2024-01-01 VITALS — BP 108/74 | HR 80 | Temp 97.3°F | Ht 62.0 in | Wt 123.8 lb

## 2024-01-01 DIAGNOSIS — I10 Essential (primary) hypertension: Secondary | ICD-10-CM | POA: Diagnosis not present

## 2024-01-01 DIAGNOSIS — F4323 Adjustment disorder with mixed anxiety and depressed mood: Secondary | ICD-10-CM

## 2024-01-01 DIAGNOSIS — E785 Hyperlipidemia, unspecified: Secondary | ICD-10-CM
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
## 2024-01-01 NOTE — Patient Instructions (Addendum)
 she has done very well with Prozac  to help get through acute stressors and we opted to try to wean off- will do every other day for a week then stop medication. She will let me know if any worsening baseline anxiety or down mood   Recommended follow up: Return in about 6 months (around 07/03/2024) for followup or sooner if needed.Schedule b4 you leave.

## 2024-01-18 ENCOUNTER — Ambulatory Visit: Admitting: Family Medicine

## 2024-02-01 ENCOUNTER — Other Ambulatory Visit: Payer: Self-pay | Admitting: Family Medicine

## 2024-02-01 ENCOUNTER — Telehealth: Payer: Self-pay | Admitting: Family Medicine

## 2024-02-01 MED ORDER — COVID-19 MRNA VAC-TRIS(PFIZER) 30 MCG/0.3ML IM SUSY: 0.3000 mL | PREFILLED_SYRINGE | Freq: Once | INTRAMUSCULAR | 0 refills | Status: AC

## 2024-02-01 NOTE — Telephone Encounter (Signed)
 I will speak to my manger regarding this issue on Monday, it is showing as sent on my end. Will alert patient. I called [facility] and they stated they cannot see it.    Copied from CRM #[id number]. Topic: Clinical - Prescription Issue >> Feb 01, 2024  3:31 PM Berneda FALCON wrote: Reason for CRM: Pt is calling back again stating she did not get the prescription for the COVID vaccine. It states in the notes that we sent it over to the [facility], but she states she called over and they do not have it. Can we call and confirm with them, please?  Patient callback is [phone] (home)

## 2024-02-01 NOTE — Telephone Encounter (Signed)
 MyChart message sent to patient stating script was sent for Covid Rx.    Copied from CRM [id number]. Topic: Clinical - Request for Lab/Test Order >> Feb 01, 2024  8:53 AM Pinkey ORN wrote: Reason for CRM: COVID Vaccine >> Feb 01, 2024  8:55 AM Pinkey ORN wrote: Patient is requesting a COVID prescription be sent over to [facility] on [address]. Please follow up with patient once complete.

## 2024-02-01 NOTE — Telephone Encounter (Signed)
 Prescription resent.    Copied from CRM #[id number]. Topic: Clinical - Prescription Issue >> Feb 01, 2024 11:07 AM Karen Salinas wrote: Reason for CRM: Patient called in stating that the pharmacy did not receive the covid vaccine rx yet and she is asking for it to be resent to her pharmacy on file.

## 2024-02-24 ENCOUNTER — Other Ambulatory Visit: Payer: Self-pay | Admitting: Internal Medicine

## 2024-03-04 ENCOUNTER — Other Ambulatory Visit: Payer: Self-pay | Admitting: Family Medicine

## 2024-04-15 ENCOUNTER — Encounter: Payer: Self-pay | Admitting: Internal Medicine

## 2024-05-05 ENCOUNTER — Encounter: Payer: Self-pay | Admitting: Family Medicine

## 2024-05-06 NOTE — Telephone Encounter (Signed)
 Forwarding to Dr. Joane to review.   Forwarding to scheduling to assist with scheduling Osteoporosis f/u visit with Dr. Joane.

## 2024-05-07 NOTE — Progress Notes (Unsigned)
° °  LILLETTE Ileana Collet, PhD, LAT, ATC acting as a scribe for Artist Lloyd, MD.  Karen Salinas is a 68 y.o. female who presents to [facility] at [facility] today for f/u osteoporosis. Pt was last seen by Dr. Lloyd on 08/07/23 and was advised to do OsteoStrong for 6 months.  Today, pt reports she has been doing OsteoStrong once a wk for the past 6-wks. She would like to review the tests that she sent through MyChart that Dr. Levy did on her at [facility]. Thoughts that because is short/petite and no fx hx, Dr. Levy did not think she was at risk for an osteoporotic fx, as these other tests were more accurate.  Dx testing: 07/31/23 DEXA  Pertinent review of systems: No fevers or chills  Relevant historical information: History lung cancer.   Exam:  BP 112/76   Pulse 63   Ht 5' 2 (1.575 m)   Wt 130 lb (59 kg)   SpO2 99%   BMI 23.78 kg/m  General: Well Developed, well nourished, and in no acute distress.   MSK: Normal motion.    Lab and Radiology Results No results found for this or any previous visit (from the past 72 hours). No results found.     Assessment and Plan: 68 y.o. female with T-score using a trabecular bone score is still -2.5 which is similar to more conventional bone density test obtained in March of this year.  Fundamentally I think she is doing the right treatment.  She is maximizing conservative management with resistance training including osteo strong and conventional weight lifting has adequate vitamin D  and is eating a healthy diet.  I think it is okay to continue current regimen and recheck bone density as typical in March 2027.   PDMP not reviewed this encounter. No orders of the defined types were placed in this encounter.  No orders of the defined types were placed in this encounter.    Discussed warning signs or symptoms. Please see discharge instructions. Patient expresses understanding.   The above documentation has been reviewed  and is accurate and complete Artist Lloyd, M.D. Total encounter time 20 minutes including face-to-face time with the patient and, reviewing past medical record, and charting on the date of service.

## 2024-05-08 ENCOUNTER — Encounter: Payer: Self-pay | Admitting: Internal Medicine

## 2024-05-08 ENCOUNTER — Ambulatory Visit (INDEPENDENT_AMBULATORY_CARE_PROVIDER_SITE_OTHER): Admitting: Family Medicine

## 2024-05-08 VITALS — BP 112/76 | HR 63 | Ht 62.0 in | Wt 130.0 lb

## 2024-05-08 DIAGNOSIS — M810 Age-related osteoporosis without current pathological fracture: Secondary | ICD-10-CM | POA: Diagnosis not present

## 2024-05-08 MED ORDER — LOSARTAN POTASSIUM 25 MG PO TABS: 37.5000 mg | ORAL_TABLET | Freq: Every day | ORAL | 0 refills | Status: AC

## 2024-05-08 NOTE — Patient Instructions (Addendum)
 Thank you for coming in today.   Continue OsteoStrong, weightbearing exercise, and strength training  Check back as needed

## 2024-05-10 ENCOUNTER — Other Ambulatory Visit: Payer: Self-pay | Admitting: Internal Medicine

## 2024-05-13 NOTE — Telephone Encounter (Signed)
 Evenity was never started due to patient preference. Discontinuing therapy plan

## 2024-05-13 NOTE — Addendum Note (Signed)
 Addended by: DAYNE SHERRY RAMAN on: 05/13/2024 11:16 AM   Modules accepted: Orders

## 2024-05-19 ENCOUNTER — Telehealth (INDEPENDENT_AMBULATORY_CARE_PROVIDER_SITE_OTHER): Admitting: Family Medicine

## 2024-05-19 ENCOUNTER — Ambulatory Visit: Payer: Self-pay

## 2024-05-19 ENCOUNTER — Telehealth: Payer: Self-pay

## 2024-05-19 ENCOUNTER — Encounter: Payer: Self-pay | Admitting: Family Medicine

## 2024-05-19 VITALS — Ht 62.0 in | Wt 126.0 lb

## 2024-05-19 DIAGNOSIS — R0981 Nasal congestion: Secondary | ICD-10-CM

## 2024-05-19 MED ORDER — AMOXICILLIN 875 MG PO TABS: 875.0000 mg | ORAL_TABLET | Freq: Two times a day (BID) | ORAL | 0 refills | Status: AC

## 2024-05-19 NOTE — Progress Notes (Signed)
 "   Virtual Visit via Video   I connected with patient on 05/19/2024 at  2:20 PM EST by a video enabled telemedicine application and verified that I am speaking with the correct person using two identifiers.  Location patient: Home Location provider: [facility], Office Persons participating in the virtual visit: Patient, Provider, CMA Rheba RAMAN)  I discussed the limitations of evaluation and management by telemedicine and the availability of in person appointments. The patient expressed understanding and agreed to proceed.  Subjective:   HPI:   Sinusitis- pt reports she developed sxs after spending time in [place].  Has mild sore throat, HA, some ear fullness, post nasal drip, cough, hoarseness.  No fever, body aches, chills.  She is anxious bc she leaves early tomorrow for [place] and fears becoming ill while she is away.  She states she doesn't speak Spanish and doesn't know what to do if things worsen.  ROS:   See pertinent positives and negatives per HPI.  Patient Active Problem List   Diagnosis Date Noted   Osteoporosis 08/07/2023   History of vertebral compression fracture 08/07/2023   Situational mixed anxiety and depressive disorder 07/30/2023   Aortic atherosclerosis 10/31/2021   Primary adenocarcinoma of lower lobe of right lung (HCC) 08/05/2021   S/P robot-assisted surgical procedure 07/12/2021   Ascending aortic aneurysm (HCC) 03/17/2021   History of total hysterectomy 03/17/2021   Essential hypertension 03/16/2015   Hyperlipidemia 06/04/2007    Social History   Tobacco Use   Smoking status: Never   Smokeless tobacco: Never  Substance Use Topics   Alcohol use: Not Currently    Alcohol/week: 0.0 - 3.0 standard drinks of alcohol    Comment: rare   Current Medications[1]  Allergies[2]  Objective:   Ht 5' 2 (1.575 m)   Wt 126 lb (57.2 kg)   BMI 23.05 kg/m  AAOx3, NAD NCAT, EOMI No obvious CN deficits Coloring WNL Pt is able to speak clearly,  coherently without shortness of breath or increased work of breathing.  + audible nasal congestion Thought process is linear.  Mood is appropriate.   Assessment and Plan:   Sinus congestion- new.  Pt w/o sxs of bacterial infxn at this time and likely has a viral illness from holiday travel.  She is leaving again tomorrow so encouraged her to start Mucinex D to improve congestion- particularly when flying.  Did send a prescription for high dose Amoxicillin  to take with her in case her sxs acutely worsen while she is away.  Reviewed supportive care and red flags that should prompt return.  Pt expressed understanding and is in agreement w/ plan.    Comer Greet, MD 05/19/2024      [1]  Current Outpatient Medications:    ALPRAZolam  (XANAX ) 0.5 MG tablet, Take 0.5 tablets (0.25 mg total) by mouth 2 (two) times daily as needed for anxiety (with flights. do not drive for 8 hours after taking)., Disp: 20 tablet, Rfl: 0   cholecalciferol (VITAMIN D3) 25 MCG (1000 UNIT) tablet, Take 1,000 Units by mouth daily., Disp: , Rfl:    clobetasol ointment (TEMOVATE) 0.05 %, Apply topically., Disp: , Rfl:    estradiol (VIVELLE-DOT) 0.05 MG/24HR patch, Place 1 patch onto the skin 2 (two) times a week., Disp: , Rfl:    ezetimibe  (ZETIA ) 10 MG tablet, TAKE 1 TABLET(10 MG) BY MOUTH DAILY, Disp: 90 tablet, Rfl: 1   FLUoxetine  (PROZAC ) 10 MG capsule, TAKE 1 CAPSULE(10 MG) BY MOUTH DAILY, Disp: 90 capsule,  Rfl: 3   losartan  (COZAAR ) 25 MG tablet, Take 1.5 tablets (37.5 mg total) by mouth daily. Please keep yearly f/u with Dr Okey for further refills., Disp: 135 tablet, Rfl: 0   pravastatin  (PRAVACHOL ) 40 MG tablet, Take 1 tablet (40 mg total) by mouth daily., Disp: 90 tablet, Rfl: 3   tretinoin (RETIN-A) 0.05 % cream, Apply 1 application topically daily as needed (blemishes)., Disp: , Rfl:  [2]  Allergies Allergen Reactions   Shellfish Allergy Diarrhea   "

## 2024-05-19 NOTE — Telephone Encounter (Signed)
 FYI Only or Action Required?: FYI only for provider: appointment scheduled on 12/29.  Patient was last seen in primary care on 01/01/2024 by Katrinka Garnette KIDD, MD.  Called Nurse Triage reporting Sore Throat and Nasal Congestion.  Symptoms began several days ago.  Interventions attempted: OTC medications: tylenol .  Symptoms are: stable.  Triage Disposition: Home Care  Patient/caregiver understands and will follow disposition?: No, wishes to speak with PCP  Copied from CRM #[id number]. Topic: Clinical - Red Word Triage >> May 19, 2024  8:01 AM Avram MATSU wrote: Red Word that prompted transfer to Nurse Triage: sinus infection possible, post nasal drip, sore throat(horse voice), ear discomfort. Reason for Disposition  Common cold with no complications  Answer Assessment - Initial Assessment Questions 1. ONSET: When did the nasal discharge start?      Friday  3. COUGH: Do you have a cough? If Yes, ask: Describe the color of your mucus. (e.g., clear, white, yellow, green)     Yes, clear mucus  4. RESPIRATORY DISTRESS: Describe your breathing.      My breathing is fine, pt noted to be comfortably speaking in multiple full sentences at a time  5. FEVER: Do you have a fever? If Yes, ask: What is your temperature, how was it measured, and when did it start?     Pt denies  6. SEVERITY: Overall, how bad are you feeling right now? (e.g., doesn't interfere with normal activities, staying home from school/work, staying in bed)      I don't feel bad right now per patient, pt states she is going to [place] tomorrow and is calling to see if she needs prophylactic treatment before traveling.   7. OTHER SYMPTOMS: Do you have any other symptoms? (e.g., earache, mouth sores, sore throat, wheezing)     Hoarseness noted by patient, no hoarseness audible to RN, cough, post-nasal drip, feels a twinge in her ears  Protocols used: Common Cold-A-AH

## 2024-05-19 NOTE — Telephone Encounter (Signed)
 Called patient

## 2024-05-19 NOTE — Telephone Encounter (Signed)
 Pt is scheduled 05/19/24 with Mahlon

## 2024-06-02 ENCOUNTER — Other Ambulatory Visit: Payer: Self-pay

## 2024-06-03 MED ORDER — PRAVASTATIN SODIUM 40 MG PO TABS: 40.0000 mg | ORAL_TABLET | Freq: Every day | ORAL | 0 refills | Status: AC

## 2024-06-09 ENCOUNTER — Encounter: Payer: Self-pay | Admitting: Family Medicine

## 2024-06-11 ENCOUNTER — Encounter: Payer: Self-pay | Admitting: Physician Assistant

## 2024-06-11 ENCOUNTER — Ambulatory Visit: Admitting: Physician Assistant

## 2024-06-11 VITALS — BP 120/76 | HR 72 | Temp 97.4°F | Ht 62.0 in | Wt 127.4 lb

## 2024-06-11 DIAGNOSIS — R6889 Other general symptoms and signs: Secondary | ICD-10-CM | POA: Diagnosis not present

## 2024-06-11 DIAGNOSIS — R10A2 Flank pain, left side: Secondary | ICD-10-CM | POA: Diagnosis not present

## 2024-06-11 LAB — CBC WITH DIFFERENTIAL/PLATELET
Basophils Absolute: 0 K/uL (ref 0.0–0.1)
Basophils Relative: 0.5 % (ref 0.0–3.0)
Eosinophils Absolute: 0 K/uL (ref 0.0–0.7)
Eosinophils Relative: 0.9 % (ref 0.0–5.0)
HCT: 41.8 % (ref 36.0–46.0)
Hemoglobin: 14.8 g/dL (ref 12.0–15.0)
Lymphocytes Relative: 42 % (ref 12.0–46.0)
Lymphs Abs: 1.8 K/uL (ref 0.7–4.0)
MCHC: 35.4 g/dL (ref 30.0–36.0)
MCV: 91.7 fl (ref 78.0–100.0)
Monocytes Absolute: 0.5 K/uL (ref 0.1–1.0)
Monocytes Relative: 11.8 % (ref 3.0–12.0)
Neutro Abs: 1.9 K/uL (ref 1.4–7.7)
Neutrophils Relative %: 44.8 % (ref 43.0–77.0)
Platelets: 216 K/uL (ref 150.0–400.0)
RBC: 4.56 Mil/uL (ref 3.87–5.11)
RDW: 12.4 % (ref 11.5–15.5)
WBC: 4.3 K/uL (ref 4.0–10.5)

## 2024-06-11 LAB — URINALYSIS, ROUTINE W REFLEX MICROSCOPIC
Bilirubin Urine: NEGATIVE
Hgb urine dipstick: NEGATIVE
Ketones, ur: NEGATIVE
Leukocytes,Ua: NEGATIVE
Nitrite: NEGATIVE
RBC / HPF: NONE SEEN
Specific Gravity, Urine: 1.02 (ref 1.000–1.030)
Total Protein, Urine: NEGATIVE
Urine Glucose: NEGATIVE
Urobilinogen, UA: 0.2 (ref 0.0–1.0)
pH: 6 (ref 5.0–8.0)

## 2024-06-11 LAB — COMPREHENSIVE METABOLIC PANEL WITH GFR
ALT: 21 U/L (ref 3–35)
AST: 21 U/L (ref 5–37)
Albumin: 4.2 g/dL (ref 3.5–5.2)
Alkaline Phosphatase: 55 U/L (ref 39–117)
BUN: 19 mg/dL (ref 6–23)
CO2: 29 meq/L (ref 19–32)
Calcium: 9.5 mg/dL (ref 8.4–10.5)
Chloride: 105 meq/L (ref 96–112)
Creatinine, Ser: 0.83 mg/dL (ref 0.40–1.20)
GFR: 72.45 mL/min
Glucose, Bld: 95 mg/dL (ref 70–99)
Potassium: 4.4 meq/L (ref 3.5–5.1)
Sodium: 139 meq/L (ref 135–145)
Total Bilirubin: 0.5 mg/dL (ref 0.2–1.2)
Total Protein: 6.7 g/dL (ref 6.0–8.3)

## 2024-06-11 NOTE — Progress Notes (Signed)
 "  History of Present Illness:   Chief Complaint  Patient presents with   Medical Management of Chronic Issues    Pt c/o chills, achy, fatigue on 06/30/2024, woke up Monday symptoms resolved. Pt concerned about a Kidney stone, denies pain. Hx of Kidney stone    Discussed the use of AI scribe software for clinical note transcription with the patient, who gave verbal consent to proceed.  History of Present Illness   Karen Salinas is a 69 year old female who presents with transient flu-like symptoms and concerns about possible kidney stones.  On 06-30-24 she had sudden onset body aches, chills, and fatigue, felt very cold, went to bed early, and took Tylenol  at midnight with persistent chills. She did not check her temperature. By Monday morning symptoms had completely resolved and she has felt well since.  Overnight she noticed a brief weird pain in her left flank area and worried about a kidney stone because her sister has had kidney stones. She has had no recurrent flank pain, fever, nausea, vomiting, bowel changes, dysuria, hematuria, urinary frequency, or abnormal vaginal discharge.  She had cancer treated successfully three years ago, which makes her more concerned about new or unusual symptoms. She has returned to her usual activities, including Pilates, without difficulty.        Past Medical History:  Diagnosis Date   Allergy Shellfish   Anxiety    Hyperlipidemia    on meds   Hypertension    on meds   IBS (irritable bowel syndrome)    years ago- better later in lfe   Kidney stones    hx of   Low back pain    on and off through most of life- compression fracture, prior gymnast     Social History[1]  Past Surgical History:  Procedure Laterality Date   ABDOMINAL HYSTERECTOMY     BREAST CYST ASPIRATION     COLONOSCOPY  30-Jun-2010   Medoff-MAC-mira(exc)-normal- 10 yr recall   CYSTOSCOPY WITH STENT PLACEMENT Right 12/16/2012   for stone. Procedure: RIGHT URETERAL STENT  PLACEMENT;  Surgeon: Noretta Ferrara, MD;  Location: [facility];  Service: Urology;  Laterality: Right;   CYSTOSCOPY/RETROGRADE/URETEROSCOPY Right 12/16/2012   Procedure: CYSTOSCOPY/RETROGRADE/URETEROSCOPY;  Surgeon: Noretta Ferrara, MD;  Location: [facility];  Service: Urology;  Laterality: Right;   DILATION AND CURETTAGE OF UTERUS     no heartbeat   EYE SURGERY  Lasik   INTERCOSTAL NERVE BLOCK Right 06/23/2021   Procedure: INTERCOSTAL NERVE BLOCK;  Surgeon: Kerrin Elspeth BROCKS, MD;  Location[facility] OR;  Service: Thoracic;  Laterality: Right;   LASIK     NODE DISSECTION Right 06/23/2021   Procedure: NODE DISSECTION;  Surgeon: Kerrin Elspeth BROCKS, MD;  Location: [facility] OR;  Service: Thoracic;  Laterality: Right;   TOTAL VAGINAL HYSTERECTOMY  06/30/2000   TUBAL LIGATION     VIDEO BRONCHOSCOPY WITH ENDOBRONCHIAL NAVIGATION N/A 06/23/2021   Procedure: VIDEO BRONCHOSCOPY WITH ENDOBRONCHIAL NAVIGATION;  Surgeon: Kerrin Elspeth BROCKS, MD;  Location: [facility] OR;  Service: Thoracic;  Laterality: N/A;   XI ROBOTIC ASSISTED THORACOSCOPY- SEGMENTECTOMY Right 06/23/2021   Procedure: XI ROBOTIC ASSISTED THORACOSCOPY-RIGHT LOWER LOBE SUPERIOR SEGMENTECTOMY;  Surgeon: Kerrin Elspeth BROCKS, MD;  Location: [facility] OR;  Service: Thoracic;  Laterality: Right;    Family History  Problem Relation Age of Onset   Hypertension Mother        age 86 in 30-Jun-2020   Dementia Mother        died 01-Jul-2023   Hypertension  Father        unknown cause of death MI? 97   Hyperlipidemia Father    Cancer Father    Hypertension Sister    Colon polyps Brother 40   Stroke Brother    Prostate cancer Brother    High Cholesterol Brother    Sleep apnea Brother        cpap   Ovarian cancer Maternal Grandmother        age 61   Cancer Maternal Grandmother    Stroke Paternal Grandmother    Prostate cancer Paternal Grandfather    Colon cancer Neg Hx    Esophageal cancer Neg Hx    Rectal cancer Neg Hx    Stomach cancer Neg Hx     Allergies[2]  Current  Medications:  Current Medications[3]   Review of Systems:   Negative unless otherwise specified per HPI.  Vitals:   Vitals:   06/11/24 1127  BP: 120/76  Pulse: 72  Temp: (!) 97.4 F (36.3 C)  TempSrc: Temporal  SpO2: 97%  Weight: 127 lb 6.1 oz (57.8 kg)  Height: 5' 2 (1.575 m)     Body mass index is 23.3 kg/m.  Physical Exam:   Physical Exam Vitals and nursing note reviewed.  Constitutional:      General: She is not in acute distress.    Appearance: She is well-developed. She is not ill-appearing or toxic-appearing.  Cardiovascular:     Rate and Rhythm: Normal rate and regular rhythm.     Pulses: Normal pulses.     Heart sounds: Normal heart sounds, S1 normal and S2 normal.  Pulmonary:     Effort: Pulmonary effort is normal.     Breath sounds: Normal breath sounds.  Abdominal:     Tenderness: There is no right CVA tenderness or left CVA tenderness.  Skin:    General: Skin is warm and dry.  Neurological:     Mental Status: She is alert.     GCS: GCS eye subscore is 4. GCS verbal subscore is 5. GCS motor subscore is 6.  Psychiatric:        Speech: Speech normal.        Behavior: Behavior normal. Behavior is cooperative.     Assessment and Plan:   Assessment and Plan    Left flank pain; Flu-like symptoms Intermittent pain and systemic symptoms resolved. Differential includes nephrolithiasis vs viral illness vs other. No current symptoms or history of UTIs. Family history of kidney stones noted. - Ordered blood work and urinalysis for hematuria and kidney function. - Consider CT scan without contrast if indicated by tests. - Advised to monitor symptoms and consider CT scan if symptoms recur or worsen.      Lucie Buttner, PA-C     [1]  Social History Tobacco Use   Smoking status: Never   Smokeless tobacco: Never  Vaping Use   Vaping status: Never Used  Substance Use Topics   Alcohol use: Not Currently    Alcohol/week: 0.0 - 3.0 standard drinks  of alcohol    Comment: rare   Drug use: No  [2]  Allergies Allergen Reactions   Shellfish Allergy Diarrhea  [3]  Current Outpatient Medications:    ALPRAZolam  (XANAX ) 0.5 MG tablet, Take 0.5 tablets (0.25 mg total) by mouth 2 (two) times daily as needed for anxiety (with flights. do not drive for 8 hours after taking)., Disp: 20 tablet, Rfl: 0   cholecalciferol (VITAMIN D3) 25 MCG (1000 UNIT) tablet, Take 1,000  Units by mouth daily., Disp: , Rfl:    clobetasol ointment (TEMOVATE) 0.05 %, Apply topically., Disp: , Rfl:    estradiol (VIVELLE-DOT) 0.05 MG/24HR patch, Place 1 patch onto the skin 2 (two) times a week., Disp: , Rfl:    ezetimibe  (ZETIA ) 10 MG tablet, TAKE 1 TABLET(10 MG) BY MOUTH DAILY, Disp: 90 tablet, Rfl: 1   FLUoxetine  (PROZAC ) 10 MG capsule, TAKE 1 CAPSULE(10 MG) BY MOUTH DAILY, Disp: 90 capsule, Rfl: 3   losartan  (COZAAR ) 25 MG tablet, Take 1.5 tablets (37.5 mg total) by mouth daily. Please keep yearly f/u with Dr Okey for further refills., Disp: 135 tablet, Rfl: 0   OVER THE COUNTER MEDICATION, Take 2 capsules by mouth daily in the afternoon. Osteo strong joint release, Disp: , Rfl:    pravastatin  (PRAVACHOL ) 40 MG tablet, Take 1 tablet (40 mg total) by mouth daily., Disp: 90 tablet, Rfl: 0   tretinoin (RETIN-A) 0.05 % cream, Apply 1 application topically daily as needed (blemishes)., Disp: , Rfl:   "

## 2024-06-12 ENCOUNTER — Encounter: Payer: Self-pay | Admitting: Physician Assistant

## 2024-06-12 ENCOUNTER — Ambulatory Visit: Payer: Self-pay | Admitting: Physician Assistant

## 2024-06-13 ENCOUNTER — Telehealth: Payer: Self-pay

## 2024-06-13 ENCOUNTER — Other Ambulatory Visit: Payer: Self-pay | Admitting: Physician Assistant

## 2024-06-13 DIAGNOSIS — R109 Unspecified abdominal pain: Secondary | ICD-10-CM

## 2024-06-13 LAB — URINE CULTURE
MICRO NUMBER:: 17496854
SPECIMEN QUALITY:: ADEQUATE

## 2024-06-13 MED ORDER — AMOXICILLIN-POT CLAVULANATE 875-125 MG PO TABS: 1.0000 | ORAL_TABLET | Freq: Two times a day (BID) | ORAL | 0 refills | Status: AC

## 2024-06-13 NOTE — Telephone Encounter (Signed)
 Spoke to patient to review lab results. Patient expressed understanding of lab results and medication .    Copied from CRM #[id number]. Topic: Clinical - Lab/Test Results >> Jun 13, 2024  3:44 PM CMA Tawni H wrote: Add CRM to previous message. Patient expressed understanding.

## 2024-06-19 ENCOUNTER — Encounter: Payer: Self-pay | Admitting: Physician Assistant

## 2024-06-20 ENCOUNTER — Other Ambulatory Visit: Payer: Self-pay | Admitting: Physician Assistant

## 2024-06-20 DIAGNOSIS — R10A2 Flank pain, left side: Secondary | ICD-10-CM

## 2024-06-23 ENCOUNTER — Other Ambulatory Visit

## 2024-06-24 ENCOUNTER — Ambulatory Visit
Admission: RE | Admit: 2024-06-24 | Discharge: 2024-06-24 | Disposition: A | Source: Ambulatory Visit | Attending: Physician Assistant

## 2024-06-24 DIAGNOSIS — R109 Unspecified abdominal pain: Secondary | ICD-10-CM

## 2024-06-25 ENCOUNTER — Encounter: Payer: Self-pay | Admitting: Physician Assistant

## 2024-06-25 ENCOUNTER — Other Ambulatory Visit

## 2024-06-25 ENCOUNTER — Ambulatory Visit: Payer: Self-pay | Admitting: Physician Assistant

## 2024-06-25 DIAGNOSIS — R10A2 Flank pain, left side: Secondary | ICD-10-CM

## 2024-06-25 LAB — URINALYSIS, ROUTINE W REFLEX MICROSCOPIC
Bilirubin Urine: NEGATIVE
Hgb urine dipstick: NEGATIVE
Ketones, ur: NEGATIVE
Leukocytes,Ua: NEGATIVE
Nitrite: NEGATIVE
Specific Gravity, Urine: 1.02 (ref 1.000–1.030)
Total Protein, Urine: NEGATIVE
Urine Glucose: NEGATIVE
Urobilinogen, UA: 0.2 (ref 0.0–1.0)
pH: 5.5 (ref 5.0–8.0)

## 2024-06-26 ENCOUNTER — Encounter: Payer: Self-pay | Admitting: Physician Assistant

## 2024-06-26 ENCOUNTER — Ambulatory Visit: Payer: Self-pay | Admitting: Physician Assistant

## 2024-06-26 LAB — URINE CULTURE
MICRO NUMBER:: 17548174
SPECIMEN QUALITY:: ADEQUATE

## 2024-07-02 ENCOUNTER — Ambulatory Visit: Admitting: Internal Medicine

## 2024-08-11 ENCOUNTER — Other Ambulatory Visit

## 2024-08-20 ENCOUNTER — Ambulatory Visit: Admitting: Internal Medicine
